# Patient Record
Sex: Male | Born: 1960
Health system: Southern US, Community
[De-identification: ages and names within clinical notes are randomized; demographics above are authoritative.]

## PROBLEM LIST (undated history)

## (undated) DIAGNOSIS — C801 Malignant (primary) neoplasm, unspecified: Secondary | ICD-10-CM

## (undated) DIAGNOSIS — R972 Elevated prostate specific antigen [PSA]: Secondary | ICD-10-CM

## (undated) DIAGNOSIS — R51 Headache: Secondary | ICD-10-CM

## (undated) DIAGNOSIS — R519 Headache, unspecified: Secondary | ICD-10-CM

## (undated) DIAGNOSIS — F329 Major depressive disorder, single episode, unspecified: Secondary | ICD-10-CM

## (undated) DIAGNOSIS — K760 Fatty (change of) liver, not elsewhere classified: Secondary | ICD-10-CM

## (undated) DIAGNOSIS — N4231 Prostatic intraepithelial neoplasia: Secondary | ICD-10-CM

## (undated) DIAGNOSIS — N401 Enlarged prostate with lower urinary tract symptoms: Secondary | ICD-10-CM

## (undated) DIAGNOSIS — E119 Type 2 diabetes mellitus without complications: Secondary | ICD-10-CM

## (undated) DIAGNOSIS — N138 Other obstructive and reflux uropathy: Secondary | ICD-10-CM

## (undated) DIAGNOSIS — L03119 Cellulitis of unspecified part of limb: Secondary | ICD-10-CM

## (undated) DIAGNOSIS — F419 Anxiety disorder, unspecified: Secondary | ICD-10-CM

## (undated) DIAGNOSIS — L0291 Cutaneous abscess, unspecified: Secondary | ICD-10-CM

## (undated) DIAGNOSIS — M1990 Unspecified osteoarthritis, unspecified site: Secondary | ICD-10-CM

## (undated) DIAGNOSIS — F32A Depression, unspecified: Secondary | ICD-10-CM

## (undated) DIAGNOSIS — K219 Gastro-esophageal reflux disease without esophagitis: Secondary | ICD-10-CM

## (undated) DIAGNOSIS — E291 Testicular hypofunction: Secondary | ICD-10-CM

## (undated) DIAGNOSIS — J449 Chronic obstructive pulmonary disease, unspecified: Secondary | ICD-10-CM

## (undated) HISTORY — DX: Headache, unspecified: R51.9

## (undated) HISTORY — DX: Prostatic intraepithelial neoplasia: N42.31

## (undated) HISTORY — DX: Other obstructive and reflux uropathy: N13.8

## (undated) HISTORY — DX: Benign prostatic hyperplasia with lower urinary tract symptoms: N40.1

## (undated) HISTORY — DX: Gastro-esophageal reflux disease without esophagitis: K21.9

## (undated) HISTORY — DX: Major depressive disorder, single episode, unspecified: F32.9

## (undated) HISTORY — DX: Headache: R51

## (undated) HISTORY — DX: Depression, unspecified: F32.A

## (undated) HISTORY — PX: ELBOW SURGERY: SHX618

## (undated) HISTORY — DX: Malignant (primary) neoplasm, unspecified: C80.1

## (undated) HISTORY — DX: Cellulitis of unspecified part of limb: L03.119

## (undated) HISTORY — DX: Testicular hypofunction: E29.1

## (undated) HISTORY — DX: Cutaneous abscess, unspecified: L02.91

## (undated) HISTORY — DX: Unspecified osteoarthritis, unspecified site: M19.90

## (undated) HISTORY — PX: OTHER SURGICAL HISTORY: SHX169

## (undated) HISTORY — DX: Elevated prostate specific antigen (PSA): R97.20

---

## 2004-04-19 ENCOUNTER — Emergency Department: Payer: Self-pay | Admitting: Emergency Medicine

## 2004-04-20 ENCOUNTER — Ambulatory Visit: Payer: Self-pay | Admitting: Emergency Medicine

## 2004-04-23 ENCOUNTER — Ambulatory Visit: Payer: Self-pay | Admitting: Gastroenterology

## 2004-04-23 IMAGING — NM NUCLEAR MEDICINE HEPATOHBILIARY INCLUDE GB
3 series · 17 of 17 positions shown · non-contrast
Comparison: none

REASON FOR EXAM: abd pain (US abd [DATE])
COMMENTS:

[Series 1: gallbladder dynamic · 4.80mm/px · 6 of 60 frames shown]
[frame 6/60]
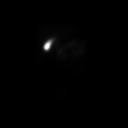
[frame 16/60]
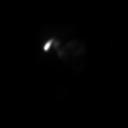
[frame 26/60]
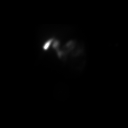
[frame 36/60]
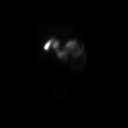
[frame 46/60]
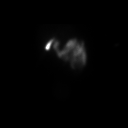
[frame 56/60]
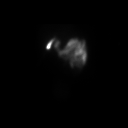

[Series 1: gallbladder statics · 4.80mm/px · 5 of 5 slices shown]
[im 1/5]
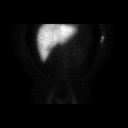
[im 2/5]
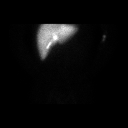
[im 3/5]
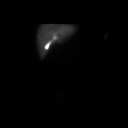
[im 4/5]
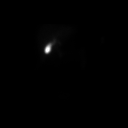
[im 5/5]
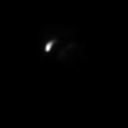

[Series 1: gallbladder dynamic (results) · 4.80mm/px · 6 of 60 frames shown]
[frame 6/60]
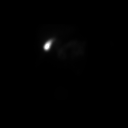
[frame 16/60]
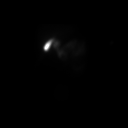
[frame 26/60]
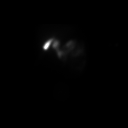
[frame 36/60]
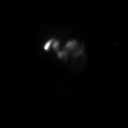
[frame 46/60]
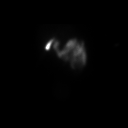
[frame 56/60]
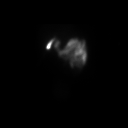

[17 of 17 positions shown; findings below may reference images not displayed]

PROCEDURE:     NM  - NM HEPATO WITH GB EJECT FRACTION  - [DATE]  [DATE]

RESULT:        Following injection of 8.01 mCi Tc 99m Choletec,  there is
noted prompt visualization of tracer activity in the liver at one minute.
At 30 minutes, tracer activity is visualized in the gallbladder, common duct
and proximal small bowel.

The gallbladder ejection fraction at 29 minutes measures 82% which is in the
normal range.
IMPRESSION: 1.     Normal hepatobiliary scan.
2.     Normal gallbladder ejection fraction.

## 2004-04-30 ENCOUNTER — Ambulatory Visit: Payer: Self-pay | Admitting: Gastroenterology

## 2004-07-03 ENCOUNTER — Ambulatory Visit: Payer: Self-pay | Admitting: Gastroenterology

## 2006-06-20 ENCOUNTER — Emergency Department: Payer: Self-pay | Admitting: Emergency Medicine

## 2007-11-23 ENCOUNTER — Ambulatory Visit: Payer: Self-pay | Admitting: Specialist

## 2007-11-23 IMAGING — CT CT CHEST W/ CM
1 series · 15 of 31 positions shown, 19 images · non-contrast
Comparison: none

REASON FOR EXAM: abnormal lung findings   spot on lung
COMMENTS:

[Series 2: soft tissue · axial · 0.71mm/px · z∈[-460,-156]mm · 15 of 67 slices shown, 19 images]
[im 3/67  mediastinal]
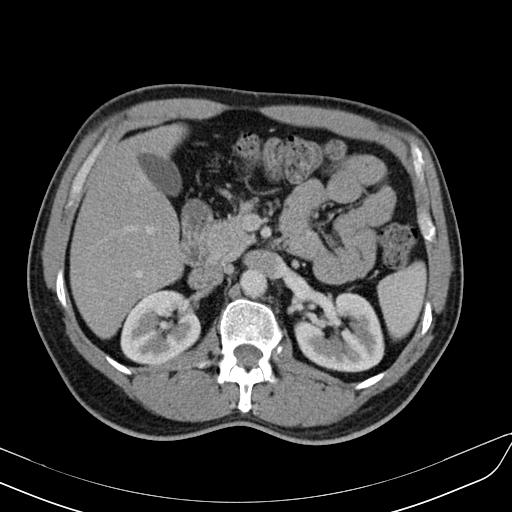
[im 3/67  lung]
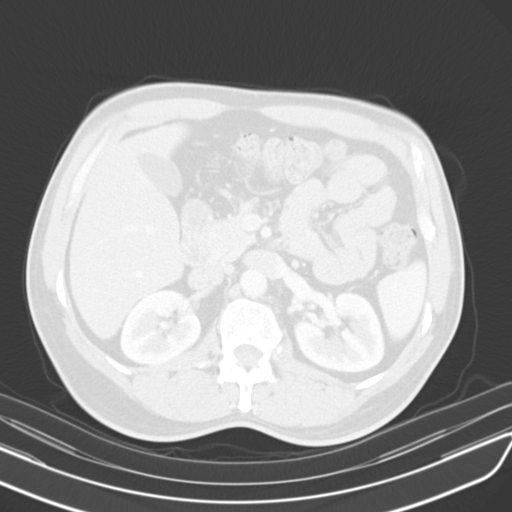
[im 8/67  lung]
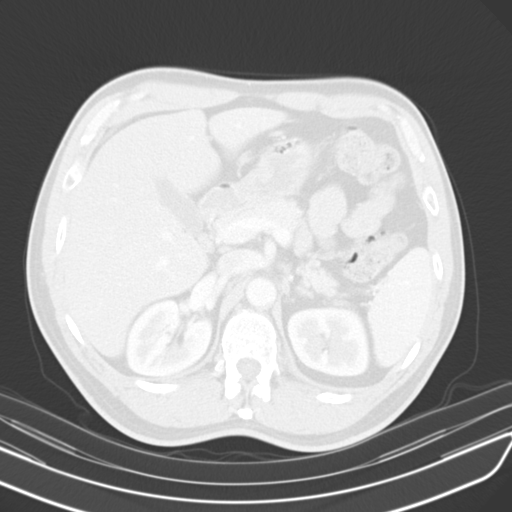
[im 13/67  lung]
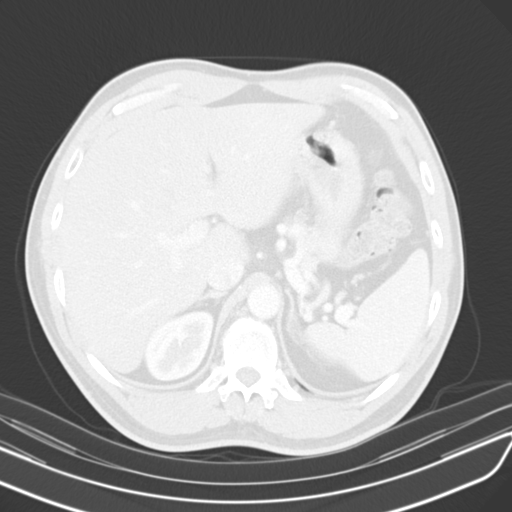
[im 15/67  lung]
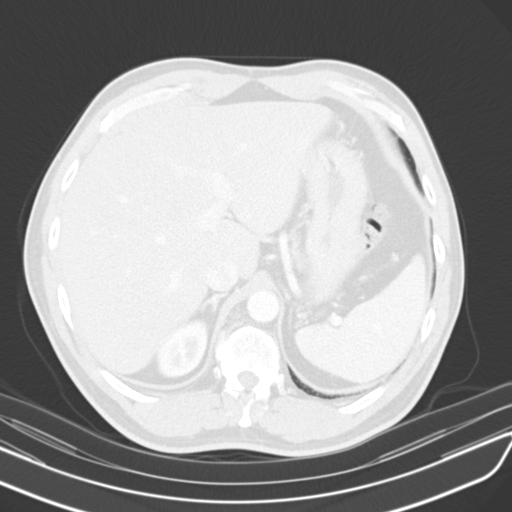
[im 20/67  mediastinal]
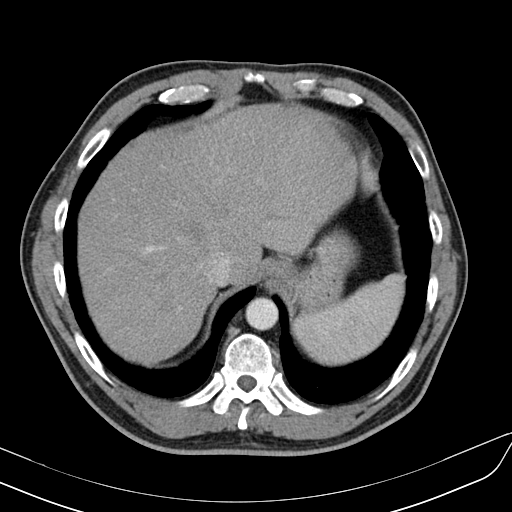
[im 20/67  lung]
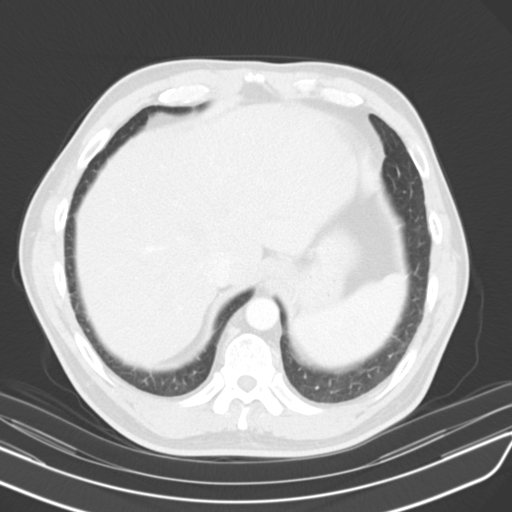
[im 25/67  lung]
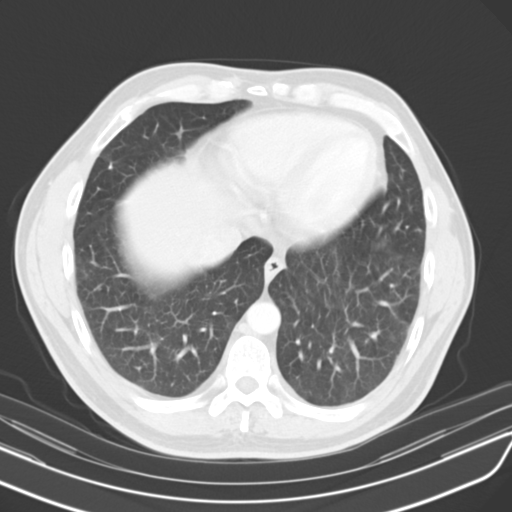
[im 30/67  lung]
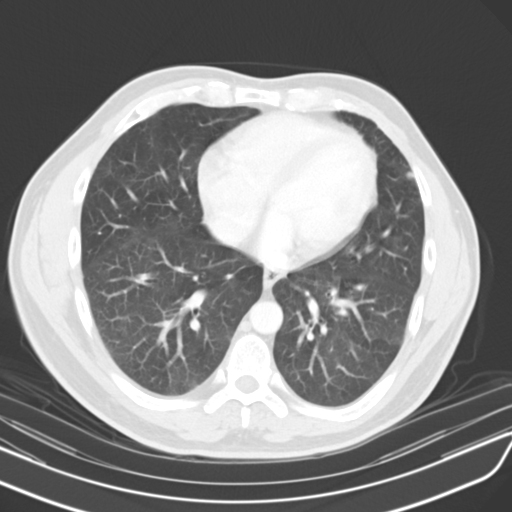
[im 35/67  lung]
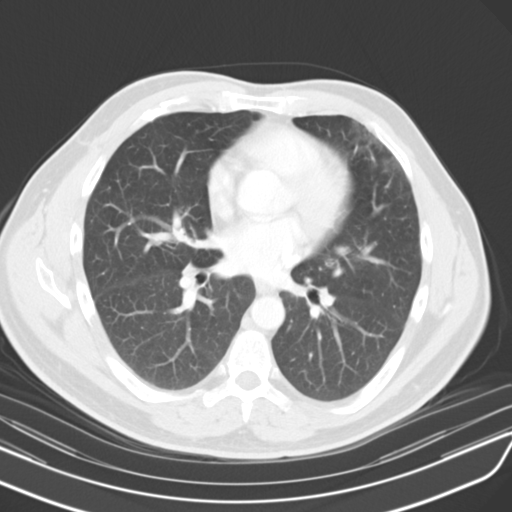
[im 37/67  mediastinal]
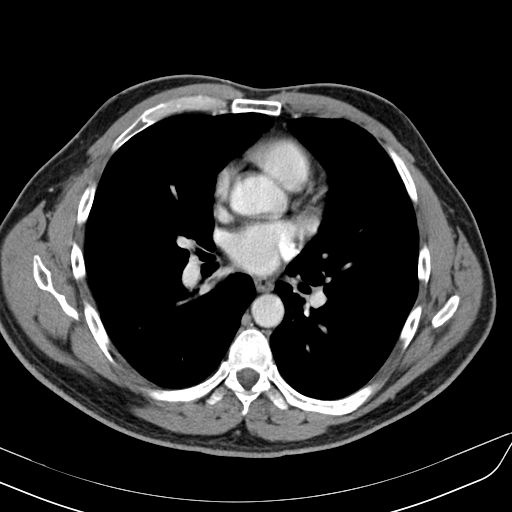
[im 37/67  lung]
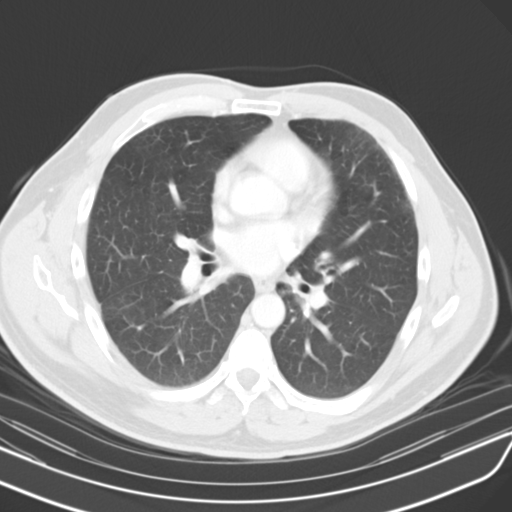
[im 42/67  lung]
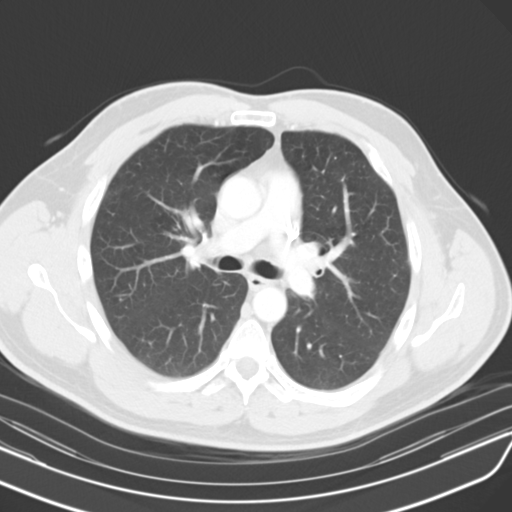
[im 47/67  lung]
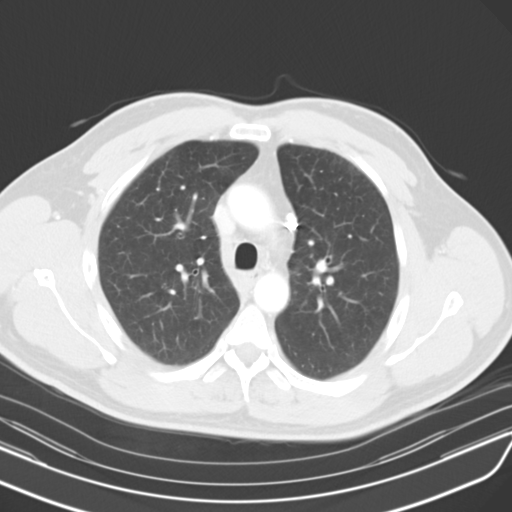
[im 52/67  lung]
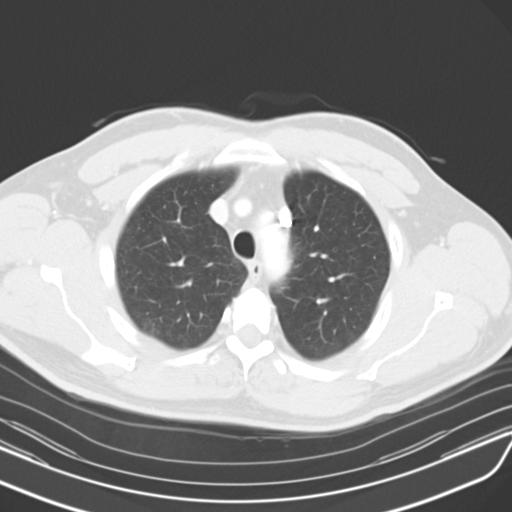
[im 54/67  mediastinal]
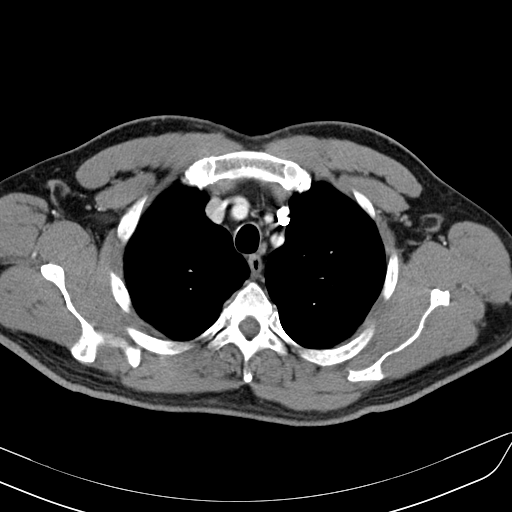
[im 54/67  lung]
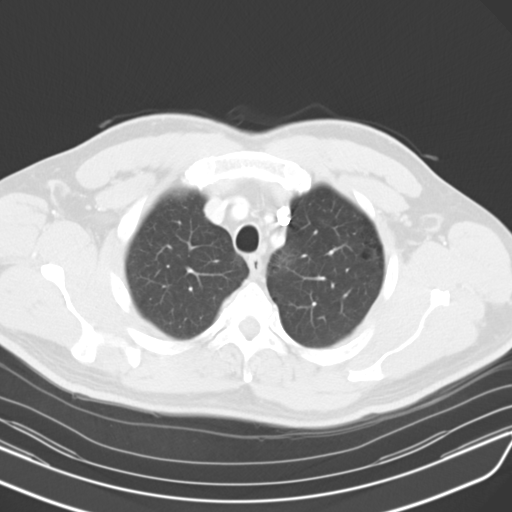
[im 59/67  lung]
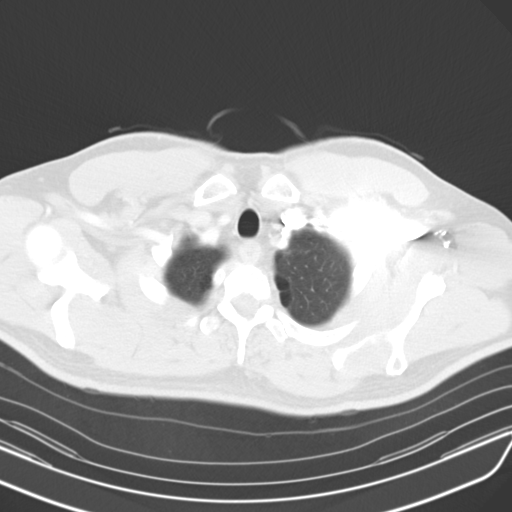
[im 64/67  lung]
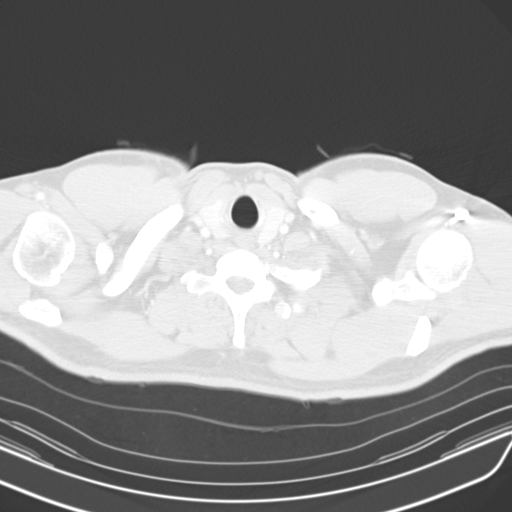

[15 of 31 positions shown; findings below may reference images not displayed]

PROCEDURE:     ARISSA - ARISSA CHEST WITH CONTRAST  - [DATE]  [DATE]

RESULT:     The patient is undergoing evaluation due to abnormality
demonstrated on prior x-ray. The patient received 75 ml of [55] for
this study.

At lung window settings there are mild emphysematous changes present
bilaterally. There is a subpleural nodule noted in the lingula on the LEFT
seen on image #38. It measures approximately 5 mm in maximal dimension. I
see no abnormal nodule on the RIGHT.

At mediastinal window settings I see no pathologic sized lymph nodes in the
hilar regions or mediastinum. There is no pleural nor paracardial effusion.
At the thoracic inlet the observed portions of the thyroid gland are normal.
The caliber of the thoracic aorta is normal. The cardiac chambers are normal
in size. The thoracic vertebral bodies are preserved in height. Within the
upper abdomen, the observed portions of the liver are normal. The patient
has a persistent LEFT-sided inferior vena cava, which crosses over to the
RIGHT at the level of the renal vein on the LEFT.
IMPRESSION: 1. There is an approximately 5 mm diameter nodule seen on image #38 in the
lingula. This is noncalcified and also nonspecific. A follow-up CT scan in 3
to 4 months is recommended to assure ongoing stability.
2. There is no evidence of CHF nor of pneumonia nor other acute
cardiopulmonary abnormality.

## 2008-04-03 ENCOUNTER — Ambulatory Visit: Payer: Self-pay | Admitting: Specialist

## 2008-04-03 IMAGING — CT CT CHEST W/ CM
1 series · 16 of 33 positions shown, 20 images · non-contrast
Comparison: none

REASON FOR EXAM: right lung nodule
COMMENTS:

[Series 2: soft tissue · axial · 0.71mm/px · z∈[-194,+116]mm · 16 of 68 slices shown, 20 images]
[im 3/68  mediastinal]
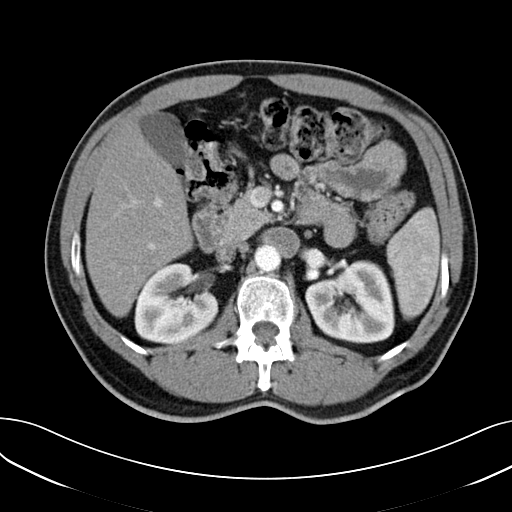
[im 3/68  lung]
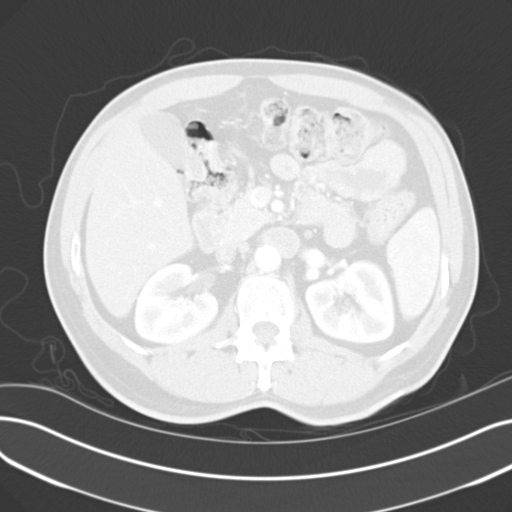
[im 8/68  lung]
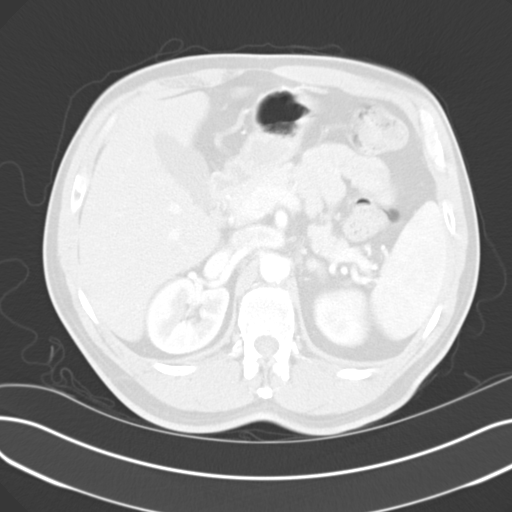
[im 13/68  lung]
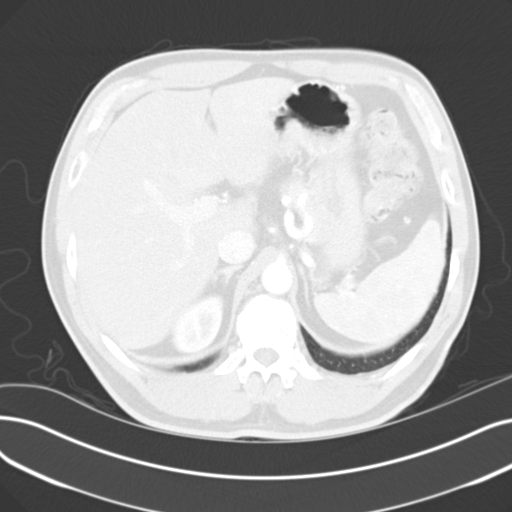
[im 15/68  lung]
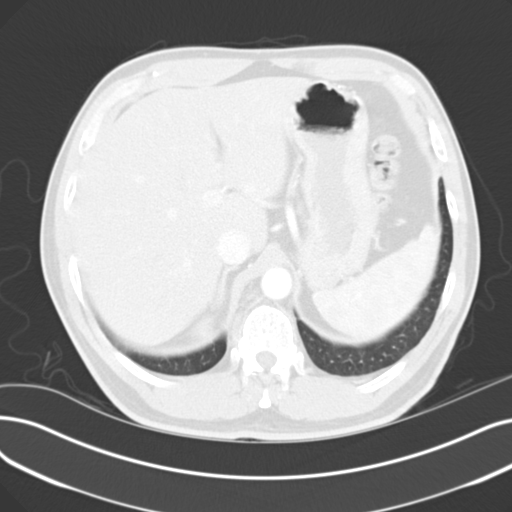
[im 20/68  mediastinal]
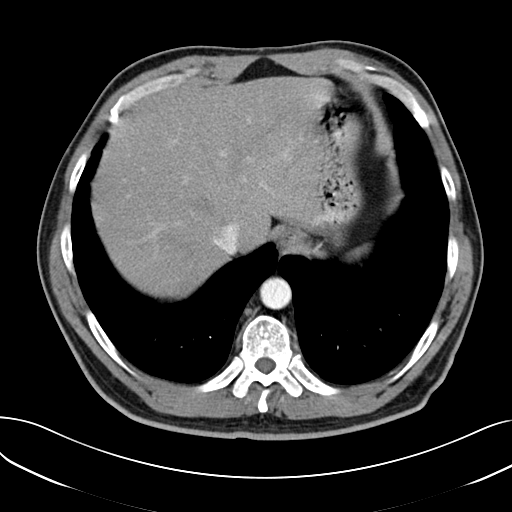
[im 20/68  lung]
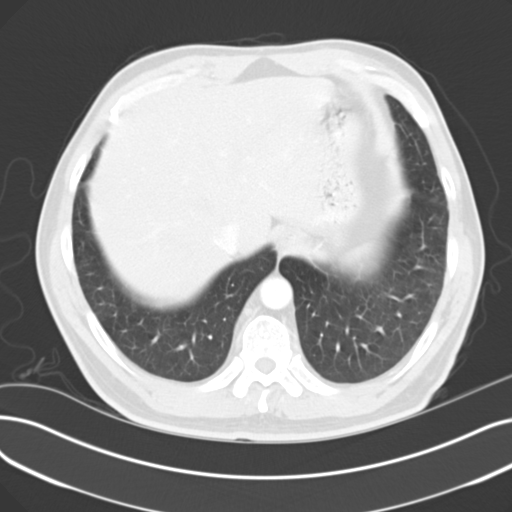
[im 25/68  lung]
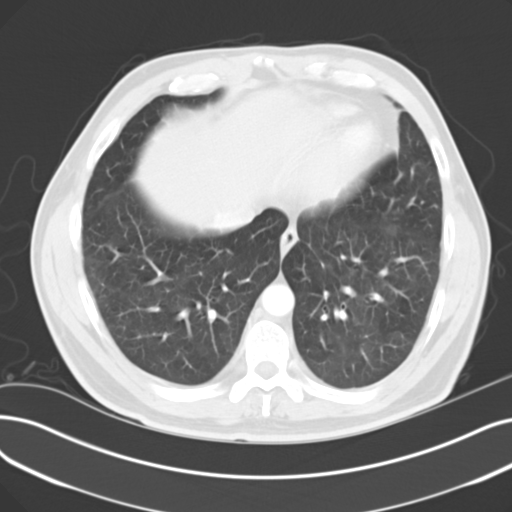
[im 28/68  lung]
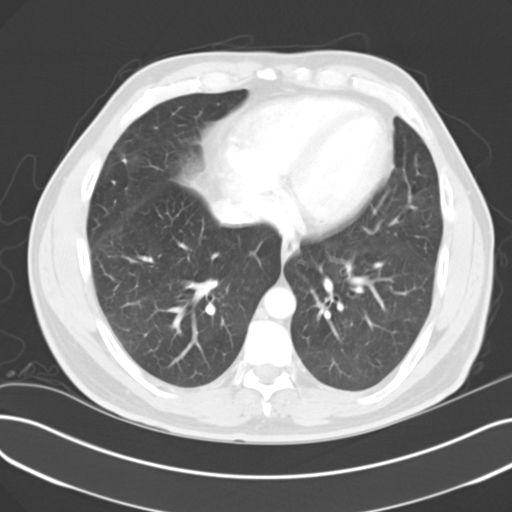
[im 33/68  lung]
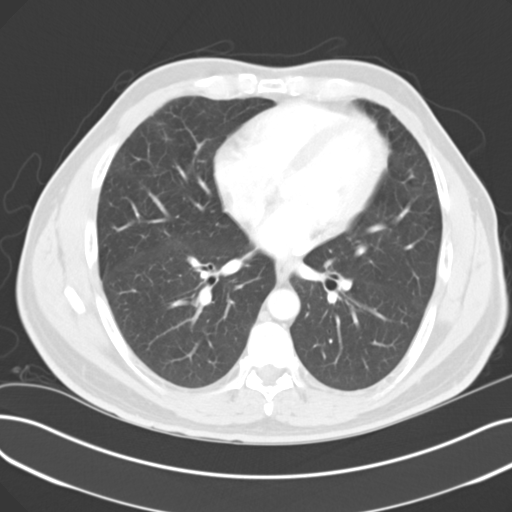
[im 36/68  mediastinal]
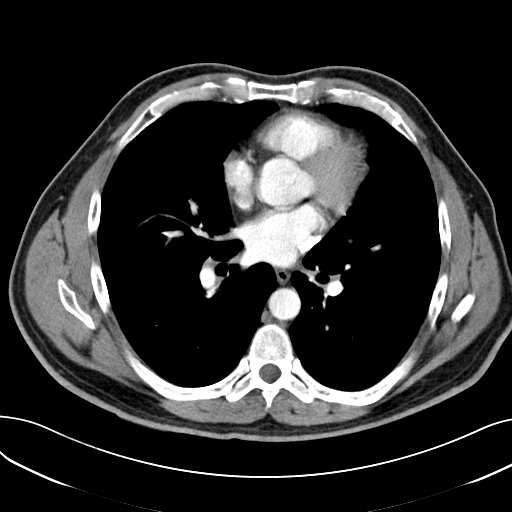
[im 36/68  lung]
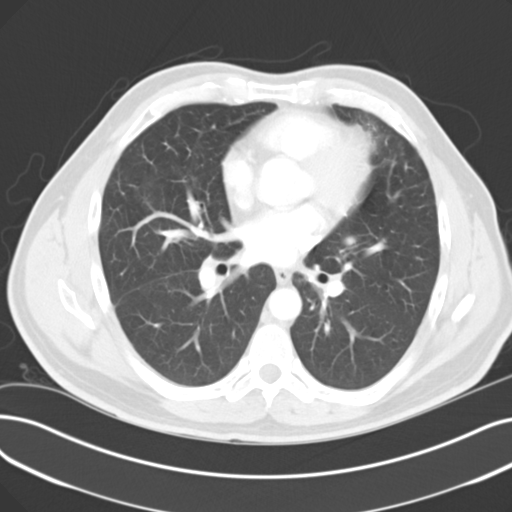
[im 40/68  lung]
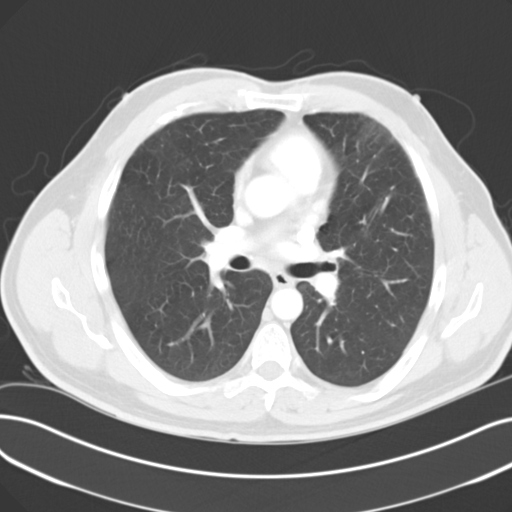
[im 43/68  lung]
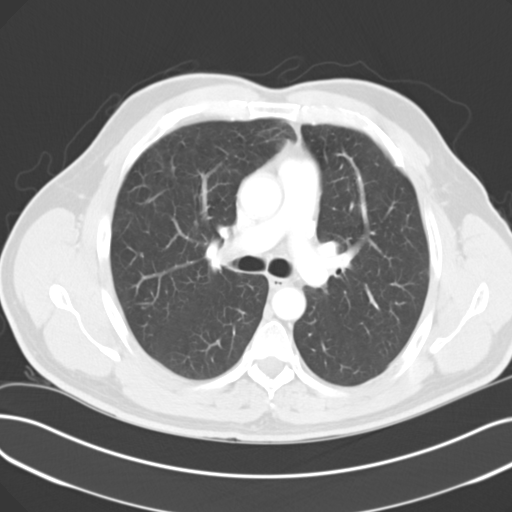
[im 48/68  lung]
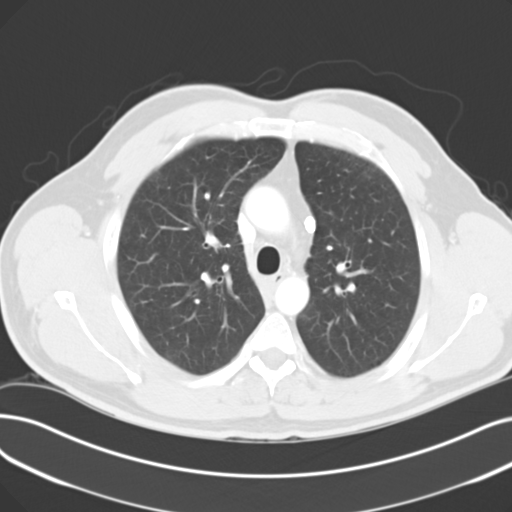
[im 53/68  mediastinal]
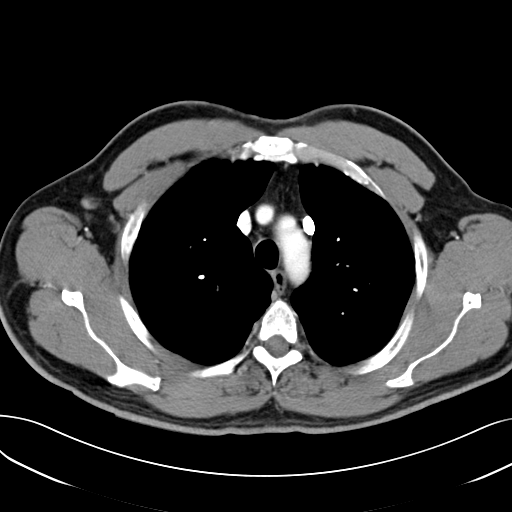
[im 53/68  lung]
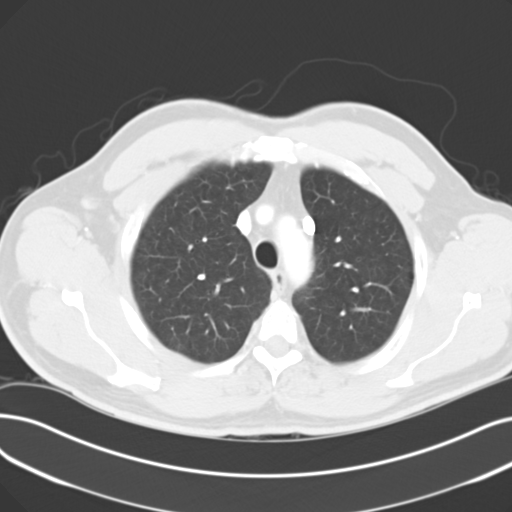
[im 55/68  lung]
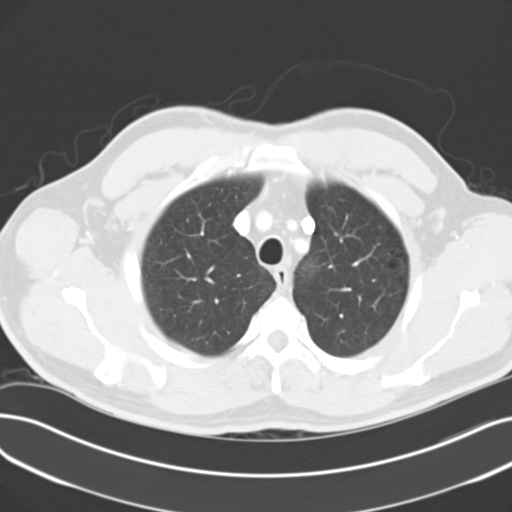
[im 60/68  lung]
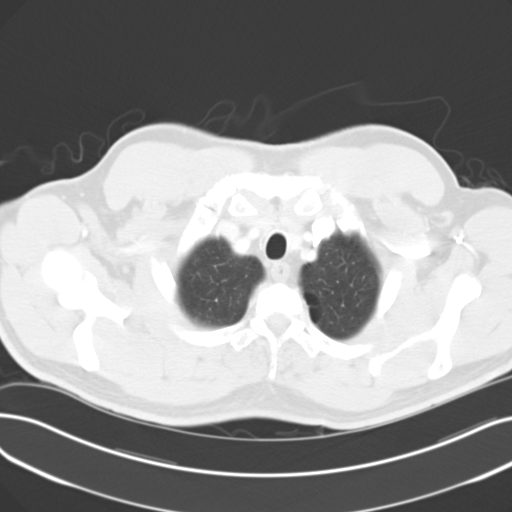
[im 65/68  lung]
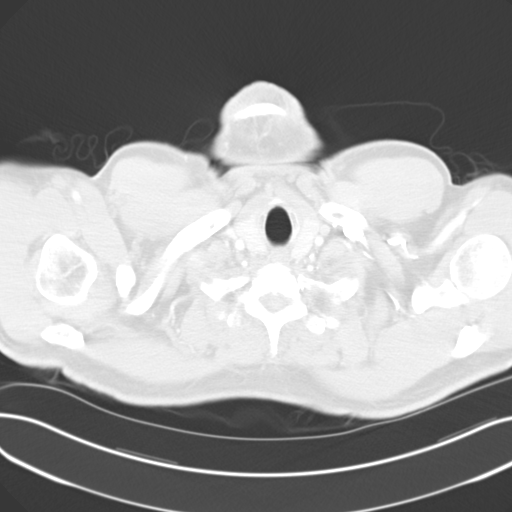

[16 of 33 positions shown; findings below may reference images not displayed]

PROCEDURE:     CT  - CT CHEST WITH CONTRAST  - [DATE]  [DATE]

RESULT:     CT of the chest is performed utilizing approximately 70 ml of
[38] iodinated intravenous contrast. Comparison is made to a previous
examination performed on [DATE]. There is a subpleural lingular density
seen on image 37 of the lung window settings and measuring approximately 5
mm in diameter. This appears to be stable. No new areas of nodularity are
present. There is no pleural or pericardial effusion. There is no
supraclavicular, axillary, mediastinal or hilar mass or adenopathy. The
upper abdominal structures included on the exam showed no definite
abnormality.
IMPRESSION: Stable subpleural nodular density in the lingula.  Follow up is recommended
in 6 months. The noncalcified smoothly marginated 5 mm lesion should be
followed for a total of 2 years following the initial diagnosis to document
stability.

## 2008-04-04 ENCOUNTER — Ambulatory Visit: Payer: Self-pay | Admitting: Unknown Physician Specialty

## 2008-09-30 ENCOUNTER — Ambulatory Visit: Payer: Self-pay | Admitting: Family

## 2008-09-30 IMAGING — CT CT CHEST W/ CM
1 of 2 series · 14 of 32 positions shown, 18 images · non-contrast
Comparison: none

REASON FOR EXAM: lung nodule
COMMENTS:

[Series 3: lung windows · axial · 0.66mm/px · z∈[+604,+874]mm · 14 of 64 slices shown, 18 images]
[im 5/64  mediastinal]
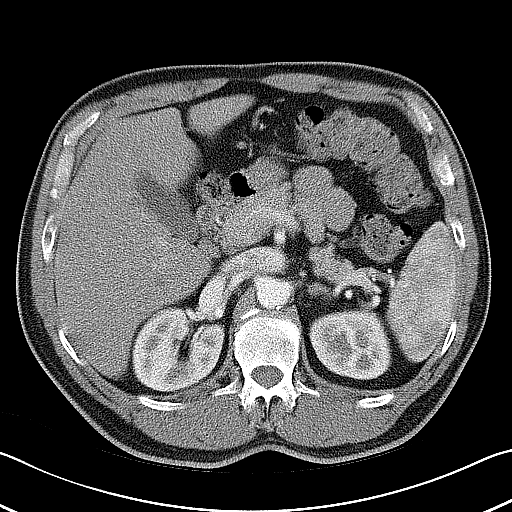
[im 5/64  lung]
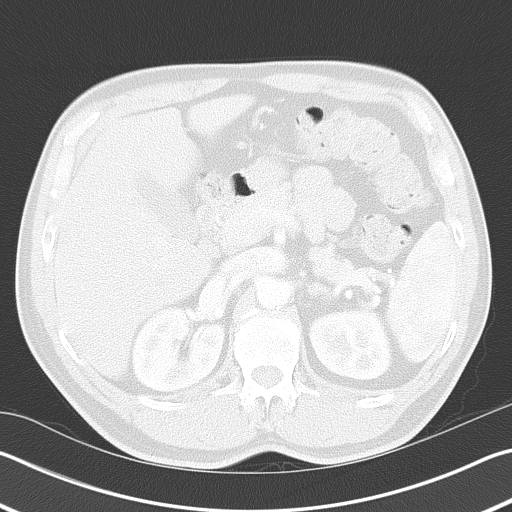
[im 10/64  lung]
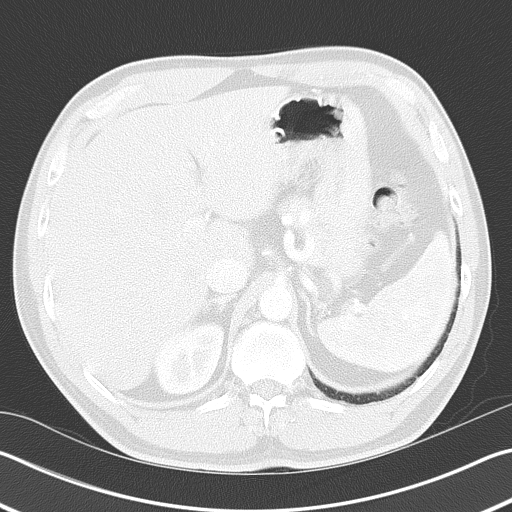
[im 15/64  lung]
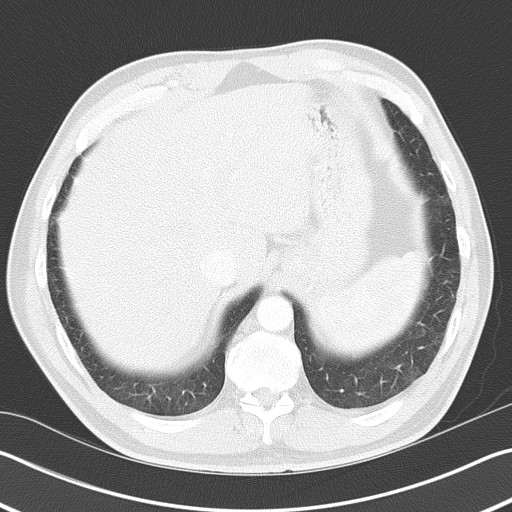
[im 20/64  lung]
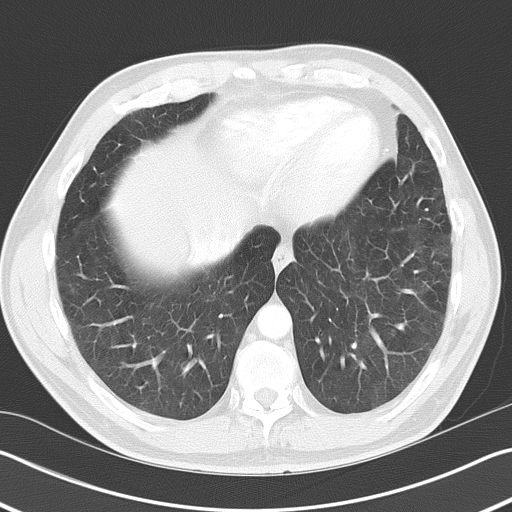
[im 25/64  mediastinal]
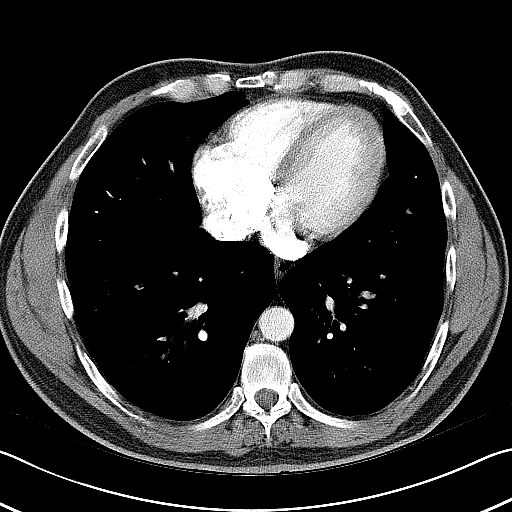
[im 25/64  lung]
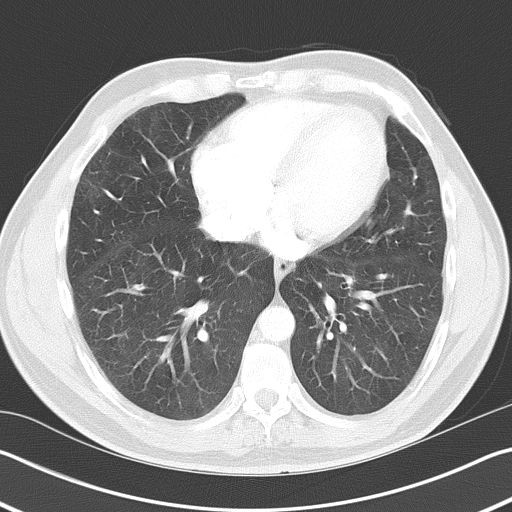
[im 29/64  lung]
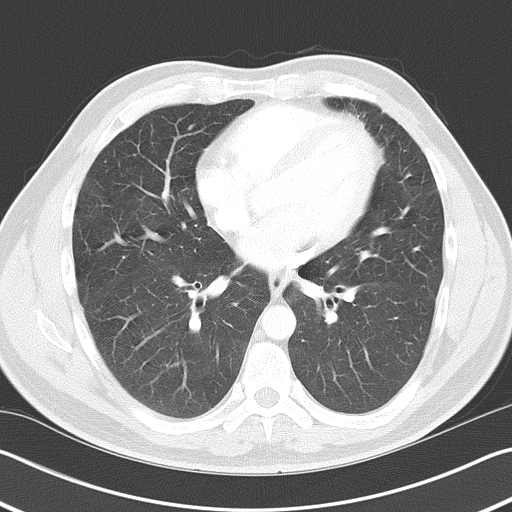
[im 30/64  lung]
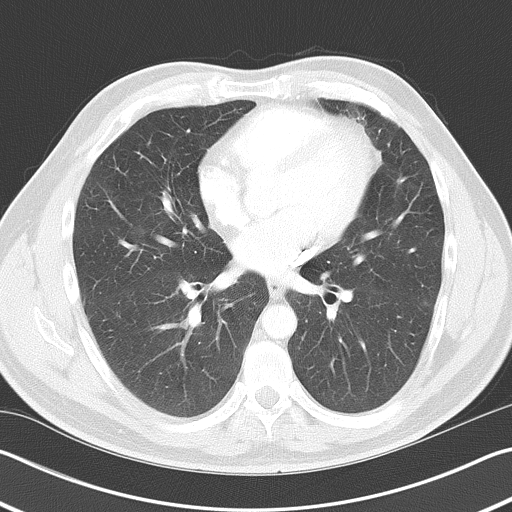
[im 32/64  lung]
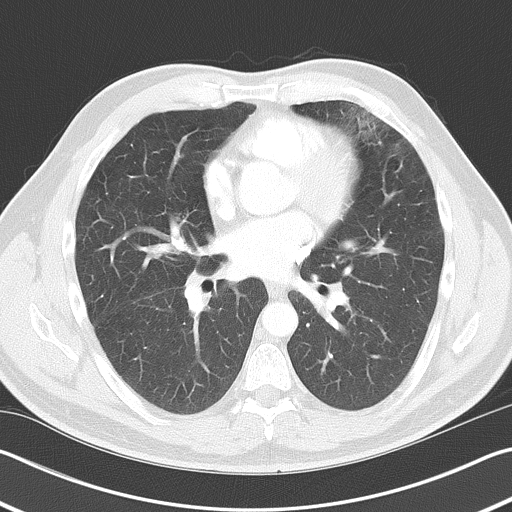
[im 34/64  mediastinal]
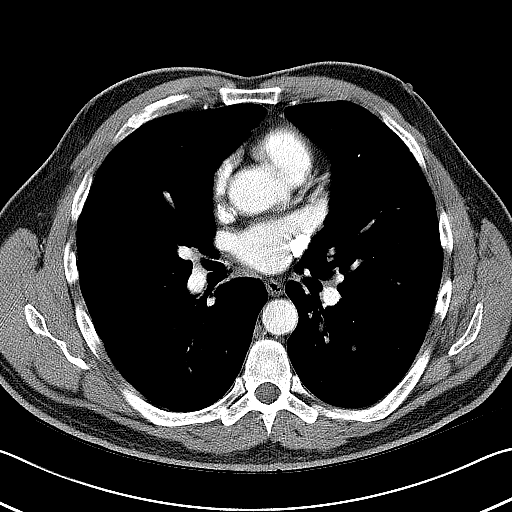
[im 34/64  lung]
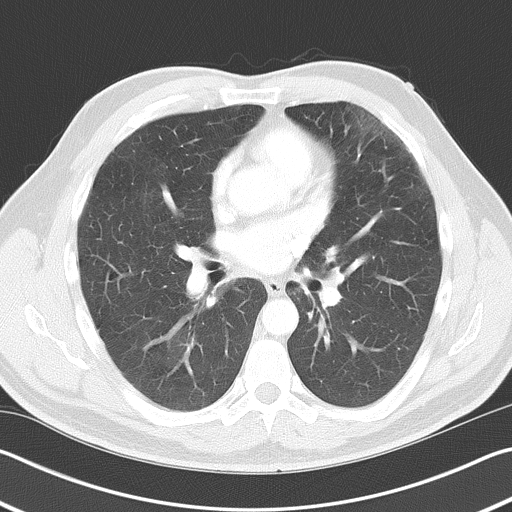
[im 39/64  lung]
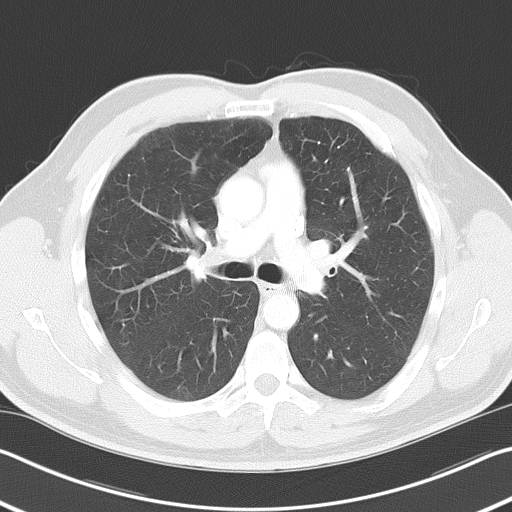
[im 44/64  lung]
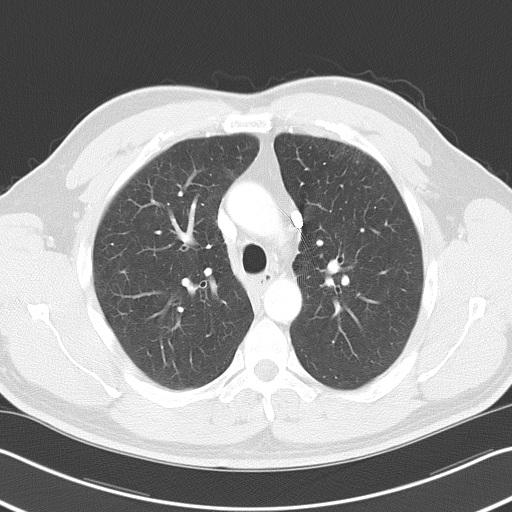
[im 49/64  lung]
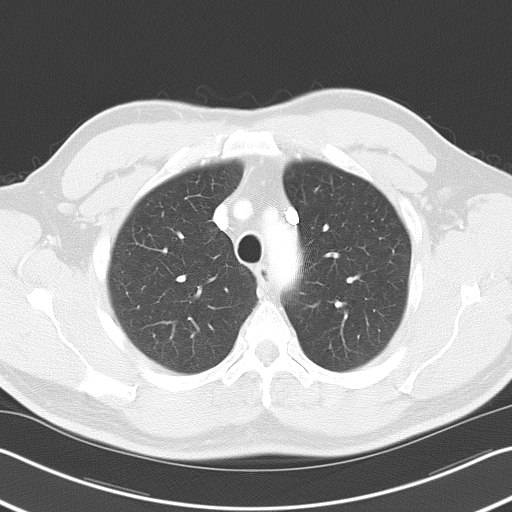
[im 54/64  mediastinal]
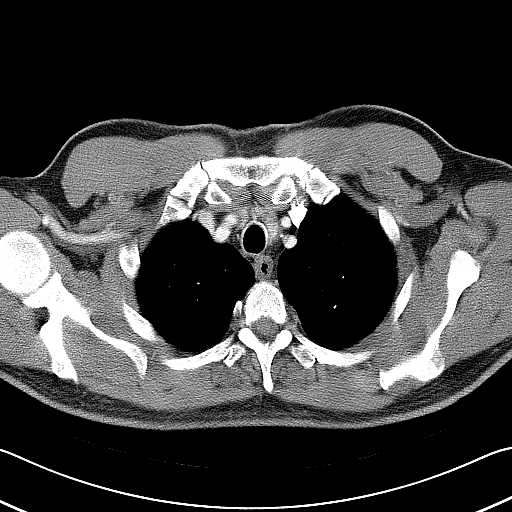
[im 54/64  lung]
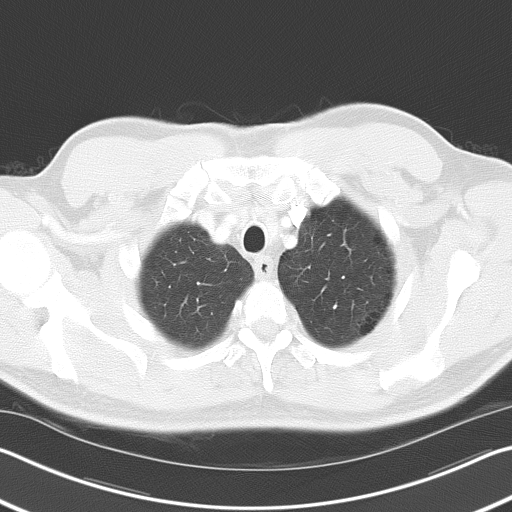
[im 59/64  lung]
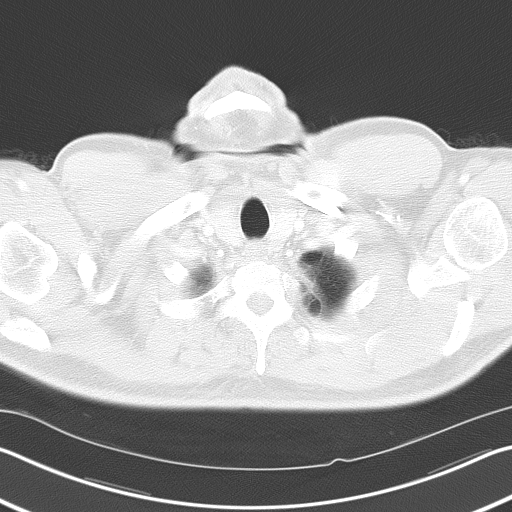

[14 of 32 positions shown; findings below may reference images not displayed]

PROCEDURE:     CT  - CT CHEST WITH CONTRAST  - [DATE] [DATE]

RESULT:

Helical, 5 mm sections were obtained from the thoracic inlet through the
lung bases status post intravenous administration of 75 ml of [TQ].

Comparison is made to a prior study dated [DATE].

Evaluation of the mediastinum and hilar regions and structures demonstrates
no evidence of mediastinal or hilar adenopathy or masses.

Evaluation of the lung parenchyma demonstrates mild to moderate
emphysematous changes throughout both lungs. The previously described,
subpleural nodular density within the anterior base of the lingula has
decreased in conspicuity when compared to the previous study. Maximum
diameter is 5.0 mm and the decreased conspicuity may be secondary to
technique. No further pulmonary nodules or masses are identified.

The visualized upper abdominal viscera demonstrate no gross abnormalities.
IMPRESSION: Stable, small nodule within the region of the lingula.
Again, this can be monitored with surveillance evaluation, if and as
clinically warranted. No new nodules or masses are identified.

## 2009-04-01 ENCOUNTER — Ambulatory Visit: Payer: Self-pay | Admitting: Family

## 2009-04-01 IMAGING — CT CT CHEST W/ CM
1 series · 15 of 32 positions shown, 19 images · non-contrast
Comparison: none

REASON FOR EXAM: lower left lung mass  Allergic to [employer]  office to
premedicate
COMMENTS:

[Series 2: soft tissue · axial · 0.73mm/px · z∈[+7,+287]mm · 15 of 63 slices shown, 19 images]
[im 5/63  soft-tissue]
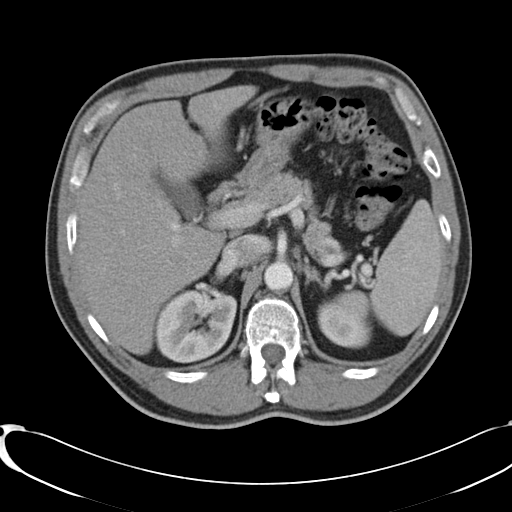
[im 5/63  bone]
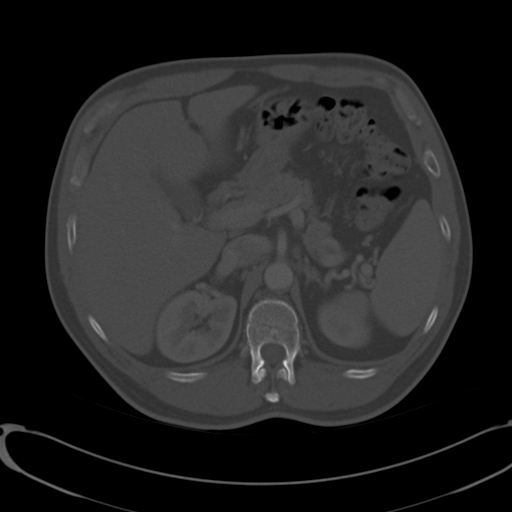
[im 9/63  soft-tissue]
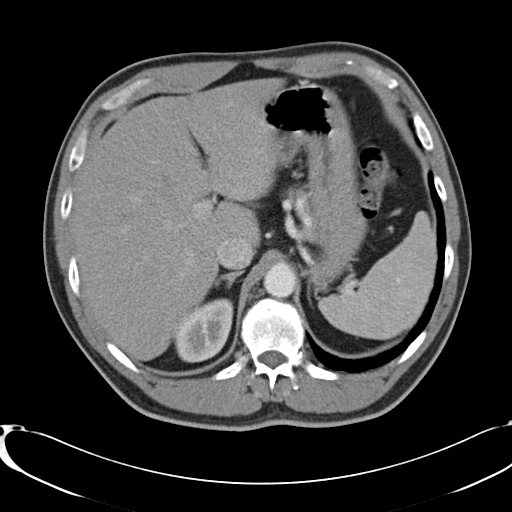
[im 13/63  soft-tissue]
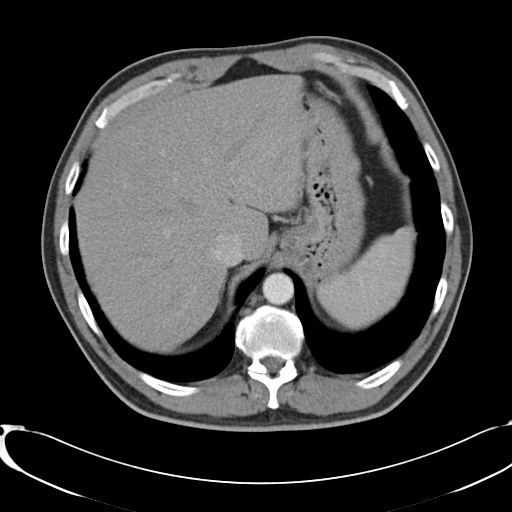
[im 19/63  soft-tissue]
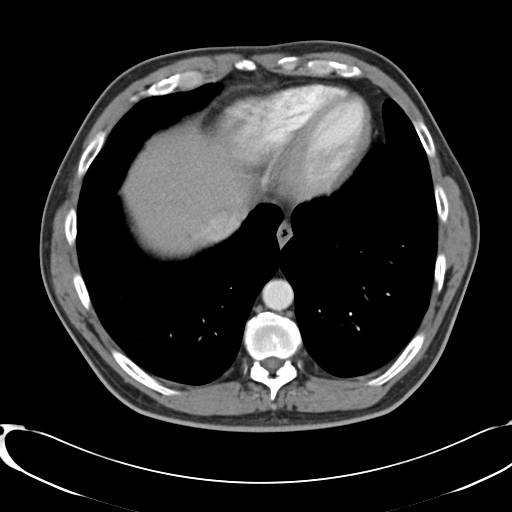
[im 23/63  soft-tissue]
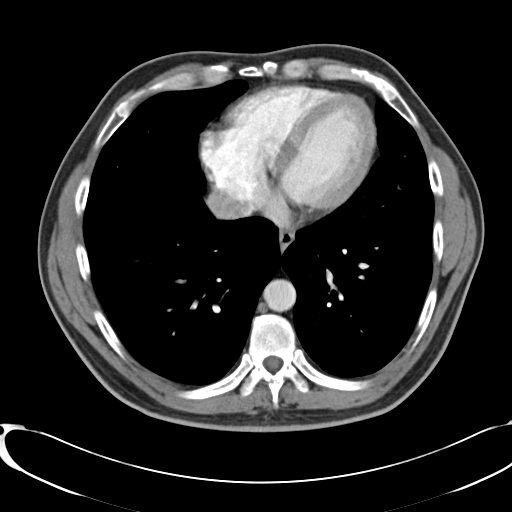
[im 27/63  soft-tissue]
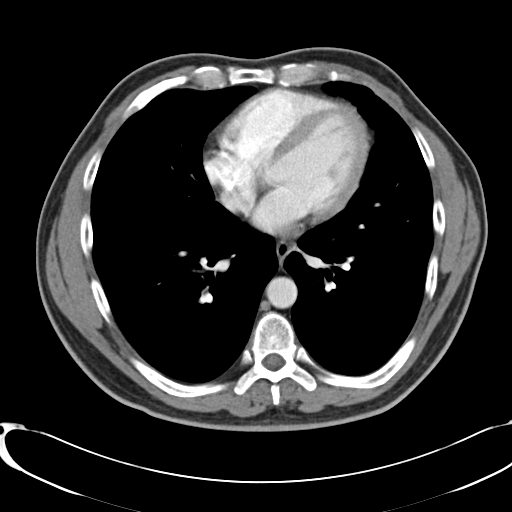
[im 33/63  soft-tissue]
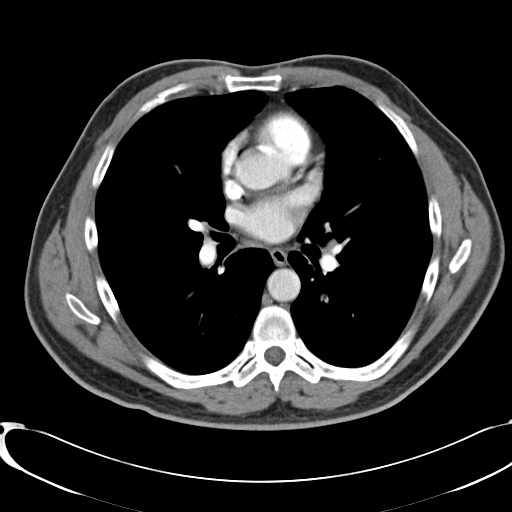
[im 37/63  soft-tissue]
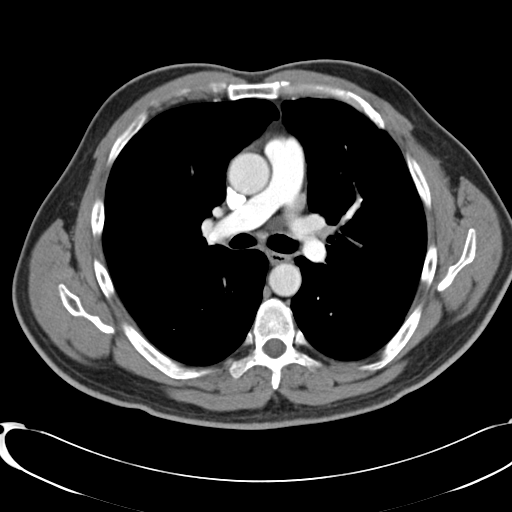
[im 41/63  soft-tissue]
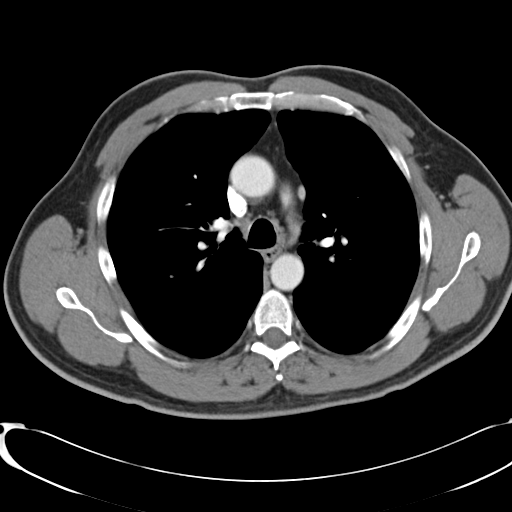
[im 41/63  bone]
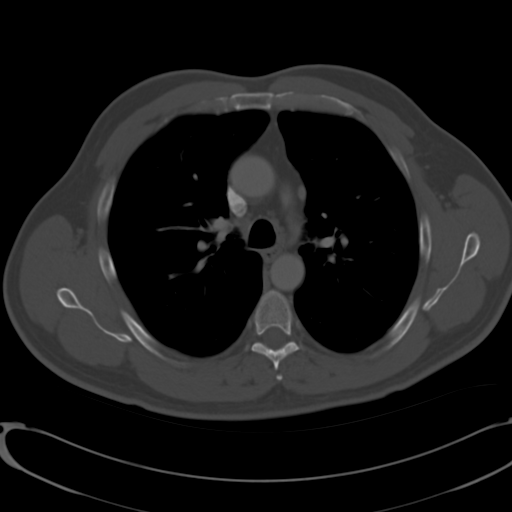
[im 45/63  soft-tissue]
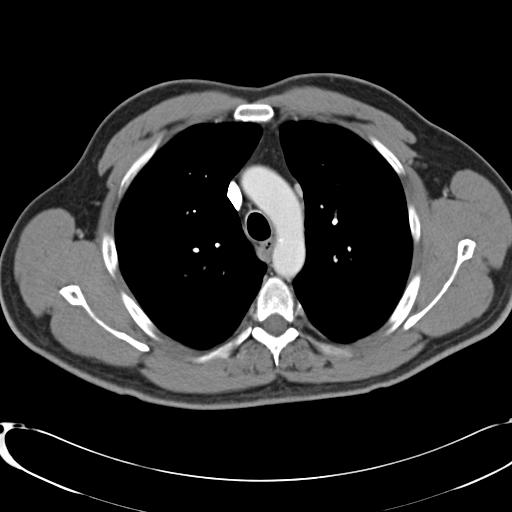
[im 51/63  soft-tissue]
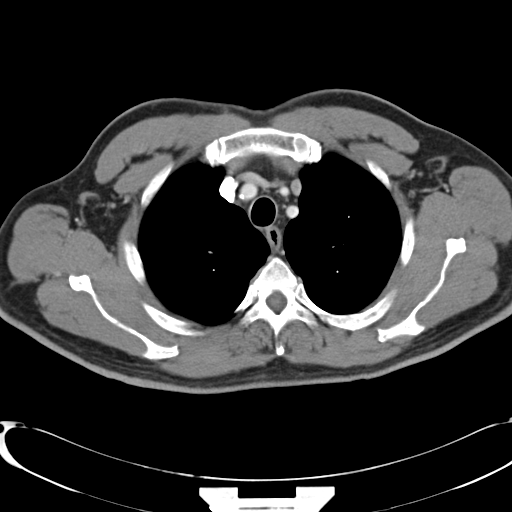
[im 55/63  soft-tissue]
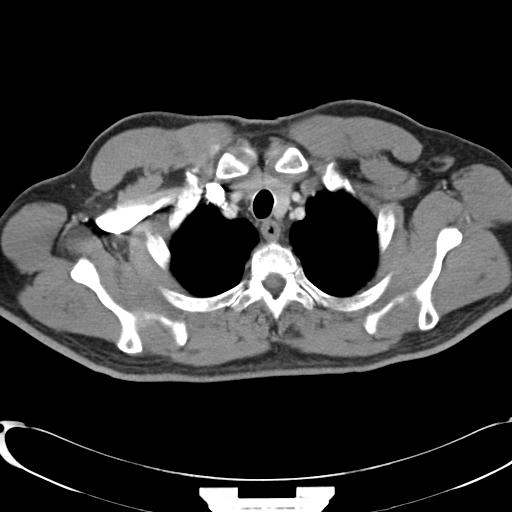
[im 55/63  lung]
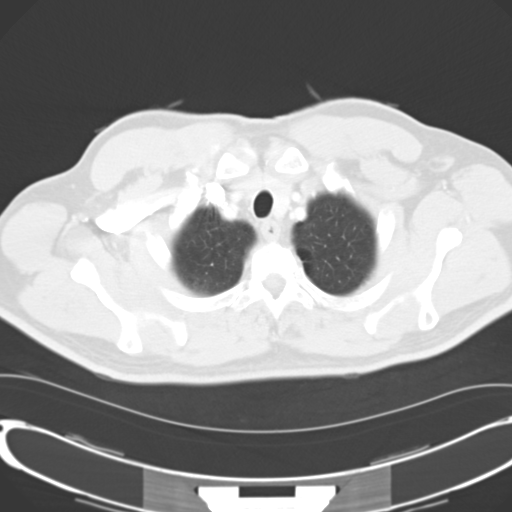
[im 57/63  lung]
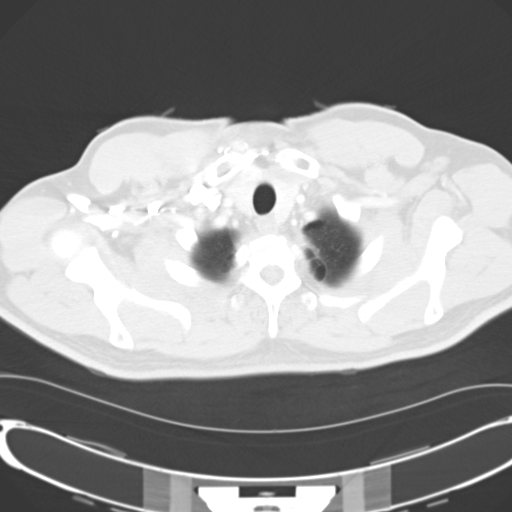
[im 59/63  soft-tissue]
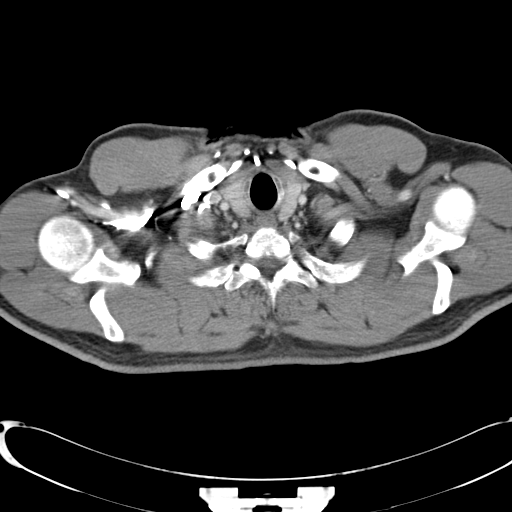
[im 59/63  lung]
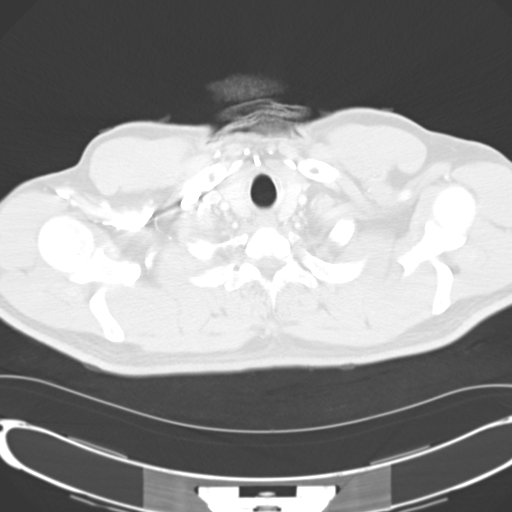
[im 61/63  lung]
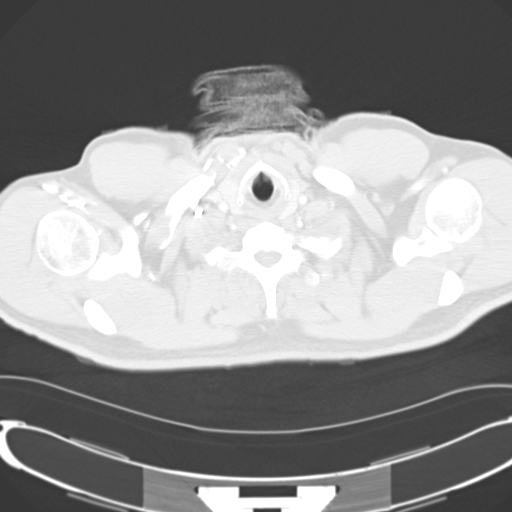

[15 of 32 positions shown; findings below may reference images not displayed]

PROCEDURE:     CT  - CT CHEST WITH CONTRAST  - [DATE]  [DATE]

RESULT:     CT of the chest is performed utilizing 75 mL of [HB]
iodinated intravenous contrast. Images are reconstructed in the axial plane
at 5 mm slice thickness. Comparison is made to images performed on [DATE]
and [DATE].

Lung window settings demonstrate a subtle 5 mm subpleural nodular density
seen on image 39 which is similar to the appearance on image 37 from the
[DATE] exam. This was less well seen on image 38 of the [DATE] exam.
The lungs are otherwise clear. There is no edema, infiltrate, effusion or
pneumothorax. Tiny peripheral bullae are seen medially at the left lung apex
on image 8. Mild emphysematous changes are present. The thyroid lobes
included on the study appear unremarkable and enhance homogeneously. There
is no pulmonary embolism, mediastinal or hilar mass. The upper abdominal
structures included on the exam appear within normal limits.
IMPRESSION: Stable chest CT with a smoothly marginated 5 mm subpleural to pleural-based
nodular density in the lingula. Followup in one year is recommended to
document stability.

## 2009-06-21 LAB — HM COLONOSCOPY

## 2011-04-07 ENCOUNTER — Other Ambulatory Visit: Payer: Self-pay | Admitting: Internal Medicine
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
## 2011-04-09 MED ORDER — SERTRALINE HCL 100 MG PO TABS: ORAL_TABLET | ORAL | Status: DC

## 2011-05-07 ENCOUNTER — Telehealth: Payer: Self-pay | Admitting: Internal Medicine

## 2011-05-07 DIAGNOSIS — K219 Gastro-esophageal reflux disease without esophagitis: Secondary | ICD-10-CM

## 2011-05-07 MED ORDER — OMEPRAZOLE 40 MG PO CPDR: 40.0000 mg | DELAYED_RELEASE_CAPSULE | Freq: Every day | ORAL | Status: DC

## 2011-05-10 NOTE — Telephone Encounter (Signed)
Refill sent it per Dr. Darrick Huntsman

## 2011-10-05 ENCOUNTER — Other Ambulatory Visit: Payer: Self-pay | Admitting: Internal Medicine

## 2011-10-14 ENCOUNTER — Other Ambulatory Visit: Payer: Self-pay | Admitting: Internal Medicine

## 2011-10-14 NOTE — Telephone Encounter (Signed)
Left message asking patient to return my call.

## 2011-10-14 NOTE — Telephone Encounter (Signed)
30 days only,.  No more refills without at getting appt .  Needs medical records reuqested

## 2012-10-13 ENCOUNTER — Other Ambulatory Visit: Payer: Self-pay | Admitting: Internal Medicine

## 2012-11-16 ENCOUNTER — Other Ambulatory Visit: Payer: Self-pay | Admitting: Internal Medicine

## 2012-11-17 NOTE — Telephone Encounter (Signed)
Patient has not been seen in office

## 2012-11-21 ENCOUNTER — Other Ambulatory Visit: Payer: Self-pay | Admitting: Internal Medicine

## 2012-11-21 NOTE — Telephone Encounter (Signed)
Patient has not been seen in office nor does he have a future appointmenet

## 2012-11-29 ENCOUNTER — Other Ambulatory Visit: Payer: Self-pay | Admitting: *Deleted

## 2012-11-29 NOTE — Telephone Encounter (Signed)
Pt has not been seen in office

## 2012-12-18 ENCOUNTER — Other Ambulatory Visit: Payer: Self-pay | Admitting: *Deleted

## 2012-12-25 ENCOUNTER — Other Ambulatory Visit: Payer: Self-pay | Admitting: *Deleted

## 2014-03-05 ENCOUNTER — Ambulatory Visit: Payer: Self-pay | Admitting: Physician Assistant

## 2014-03-05 IMAGING — NM NUCLEAR MEDICINE HEPATOHBILIARY INCLUDE GB
2 series · 15 of 15 positions shown · non-contrast
Comparison: None.

CLINICAL DATA: Right upper quadrant pain

EXAM:
NUCLEAR MEDICINE HEPATOBILIARY IMAGING WITH GALLBLADDER EF
TECHNIQUE: Sequential images of the abdomen were obtained [DATE] minutes
following intravenous administration of radiopharmaceutical. After
slow intravenous infusion of 1.56 micrograms Cholecystokinin,
gallbladder ejection fraction was determined.
RADIOPHARMACEUTICALS:  7.95 [place] [H5] Choletec

[Series 1000: gallbladder statics · 4.80mm/px · 9 of 9 slices shown]
[im 1/9]
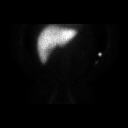
[im 2/9]
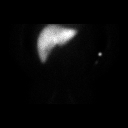
[im 3/9]
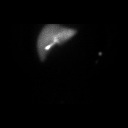
[im 4/9]
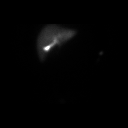
[im 5/9]
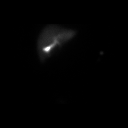
[im 6/9]
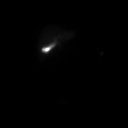
[im 7/9]
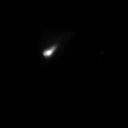
[im 8/9]
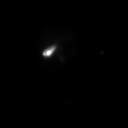
[im 9/9]
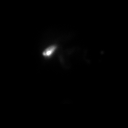

[Series 1000: gallbladder ef · 4.80mm/px · 6 of 120 frames shown]
[frame 11/120]
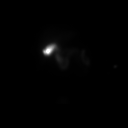
[frame 31/120]
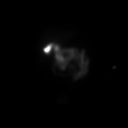
[frame 51/120]
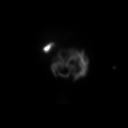
[frame 71/120]
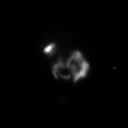
[frame 91/120]
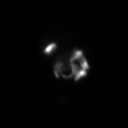
[frame 111/120]
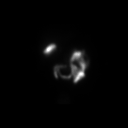

[15 of 15 positions shown; findings below may reference images not displayed]

FINDINGS: There is immediate homogeneous uptake of radiotracer in the liver.
Filling of the gallbladder begins at 10 minutes. Radiotracer uptake
is present in the small bowel at 50 minutes.

At the end of 1-hour, there is relatively good clearance of activity
from the liver.

When gallbladder filling was complete, the patient was given an IV
infusion of CCK over 30 min. The gallbladder ejection fraction
measures 77% at 60 min. At 60 min, normal ejection fraction is
greater than 33%.

The patient did experience symptoms during CCK infusion.
IMPRESSION: 1. Normal hepatobiliary scan.
2. Normal gallbladder ejection fraction.

## 2014-03-05 IMAGING — US ABDOMEN ULTRASOUND LIMITED
1 series · 14 of 25 positions shown · non-contrast
Comparison: None.

CLINICAL DATA: Right upper quadrant pain

EXAM:
US ABDOMEN LIMITED - RIGHT UPPER QUADRANT

[Series 1: abdomen ultrasound limited · 0.35mm/px · 14 of 62 slices shown]
[im 1/62]
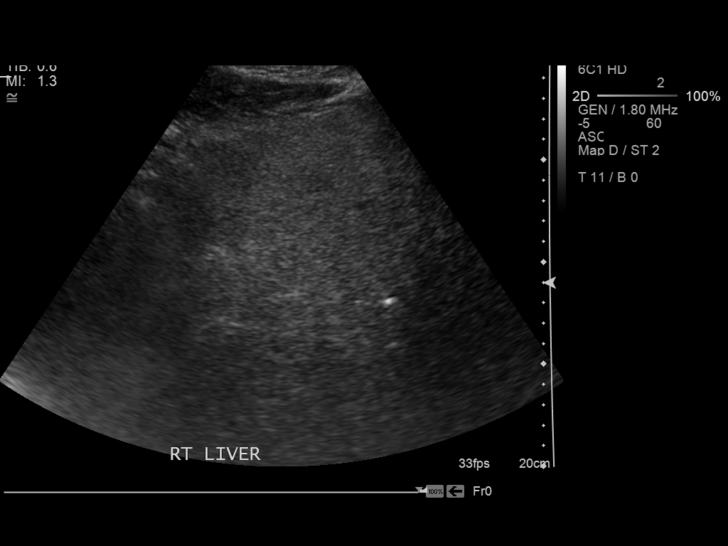
[im 6/62]
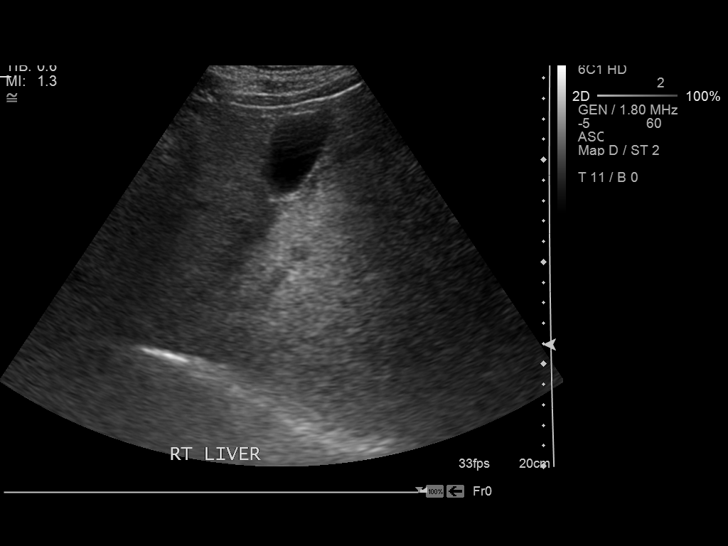
[im 11/62]
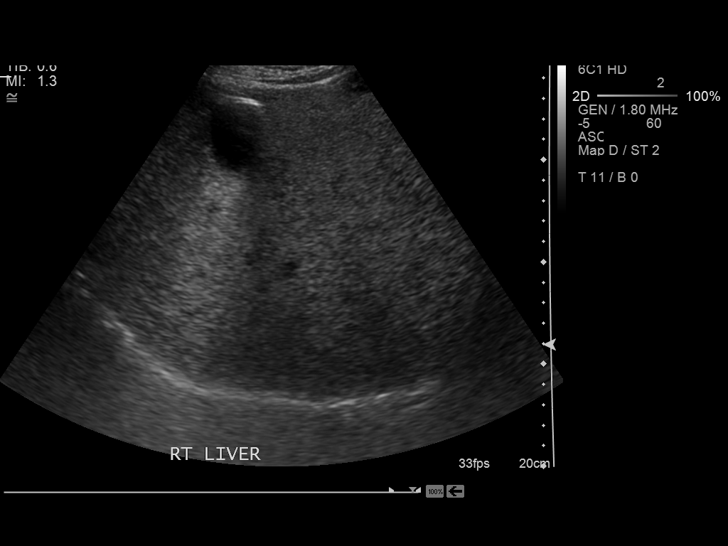
[im 16/62]
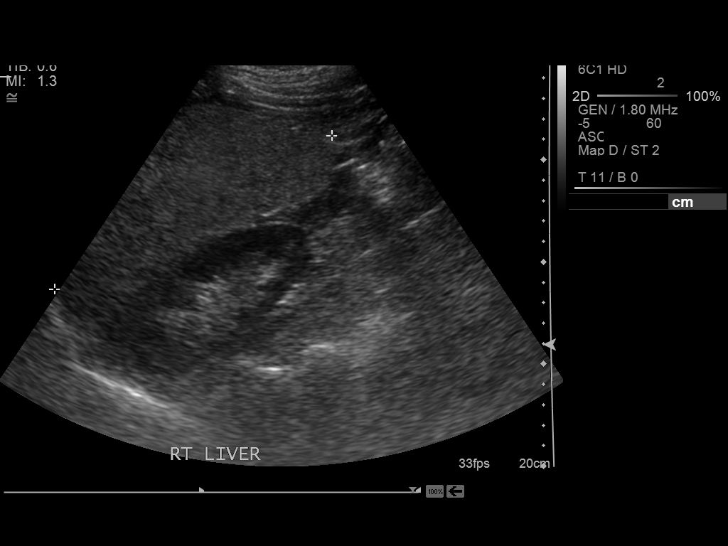
[im 21/62]
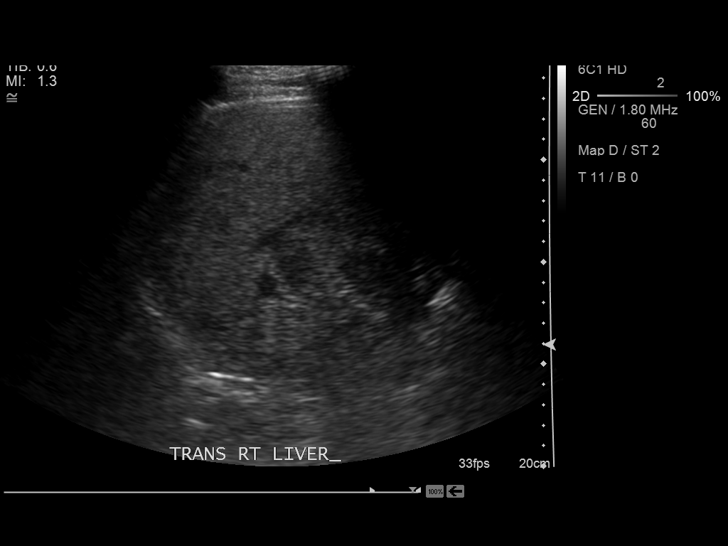
[im 23/62]
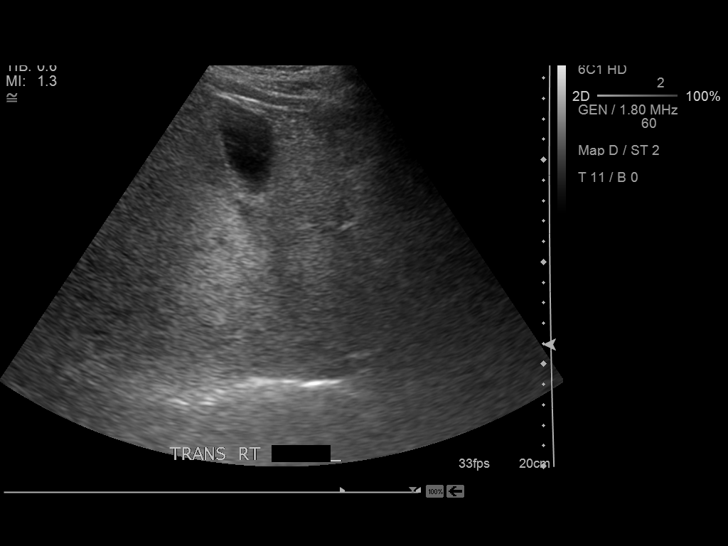
[im 28/62]
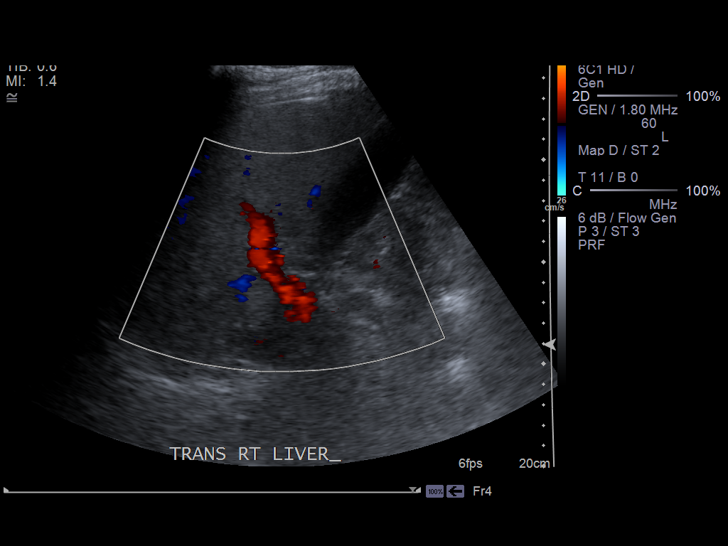
[im 34/62]
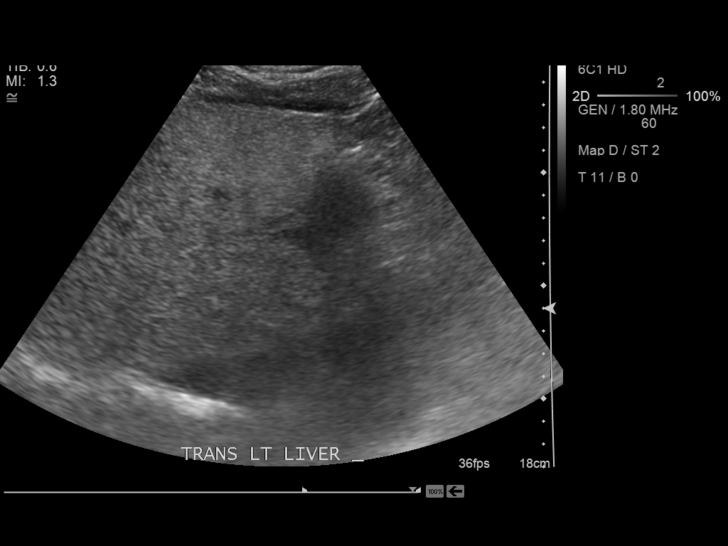
[im 39/62]
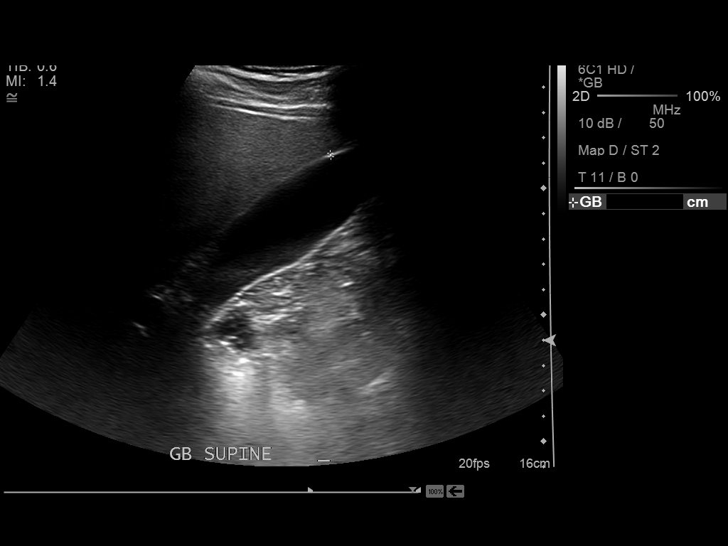
[im 41/62]
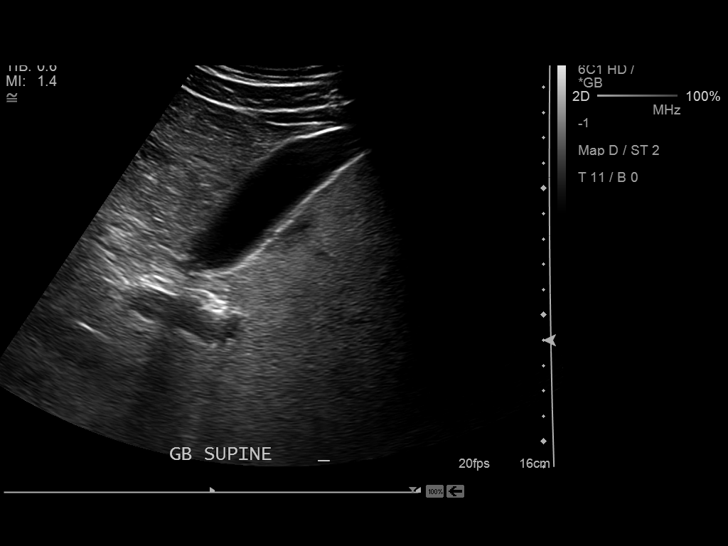
[im 46/62]
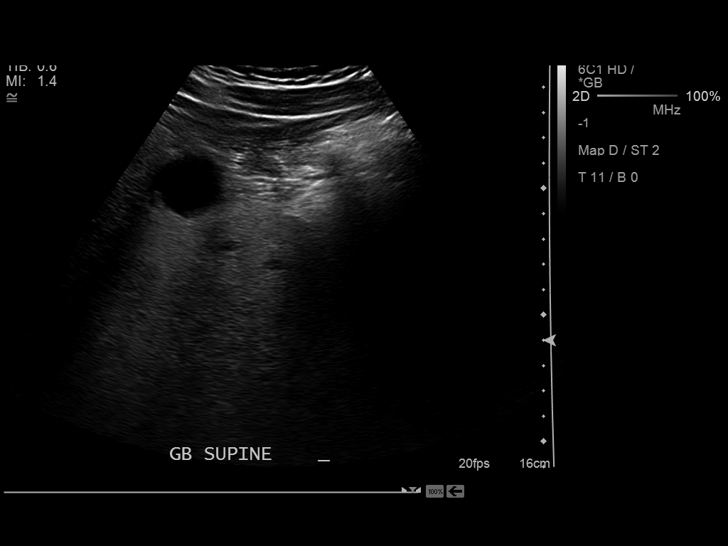
[im 51/62]
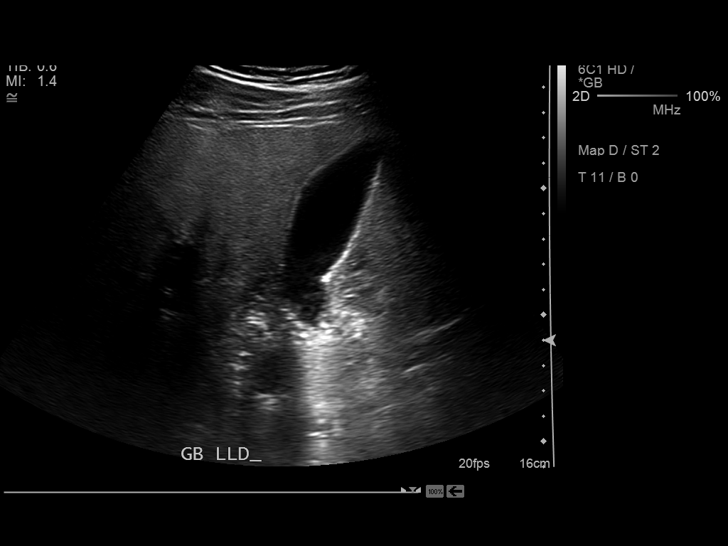
[im 56/62]
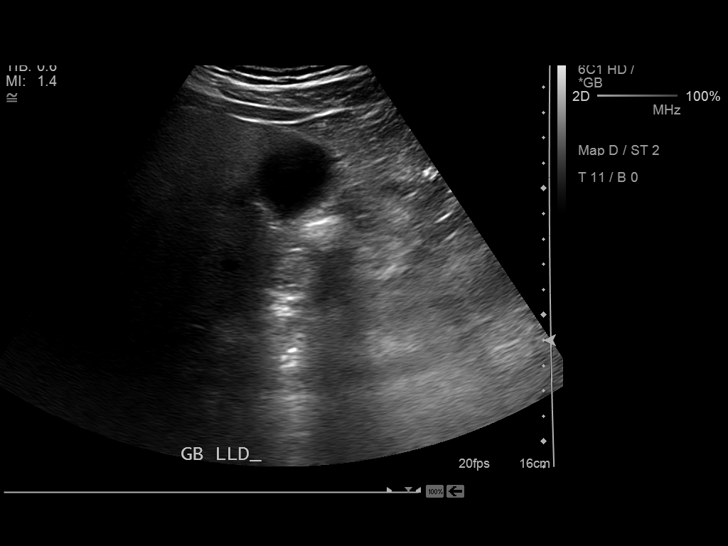
[im 62/62]
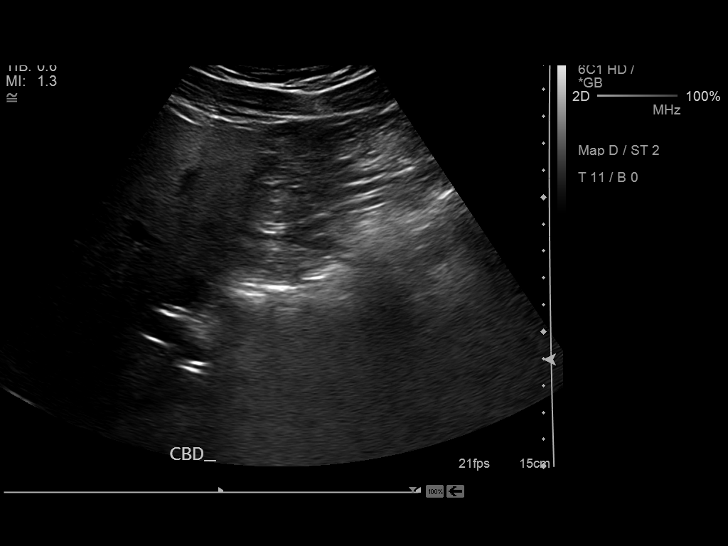

[14 of 25 positions shown; findings below may reference images not displayed]

FINDINGS: Gallbladder:

No shadowing gallstones are noted within gallbladder. There is small
layering sludge within gallbladder. No thickening of gallbladder
wall. No sonographic Murphy's sign.

Common bile duct:

Diameter: 4 mm in diameter within normal limits.

Liver:

No focal hepatic mass. There is coarse increased echogenicity of the
liver consistent with fatty infiltration.
IMPRESSION: 1. No shadowing gallstones are noted within gallbladder. Layering
gallbladder sludge. No sonographic Murphy's sign. No thickening of
gallbladder wall.
2. Normal CBD.
3. Probable fatty infiltration of the liver.

## 2014-05-04 ENCOUNTER — Emergency Department: Payer: Self-pay | Admitting: Emergency Medicine

## 2014-05-04 LAB — BASIC METABOLIC PANEL
Anion Gap: 7 (ref 7–16)
BUN: 12 mg/dL (ref 7–18)
CO2: 29 mmol/L (ref 21–32)
CREATININE: 0.89 mg/dL (ref 0.60–1.30)
Calcium, Total: 8.4 mg/dL — ABNORMAL LOW (ref 8.5–10.1)
Chloride: 105 mmol/L (ref 98–107)
EGFR (African American): >60
EGFR (Non-African Amer.): >60
Glucose: 103 mg/dL — ABNORMAL HIGH (ref 65–99)
Osmolality: 281 (ref 275–301)
POTASSIUM: 3.7 mmol/L (ref 3.5–5.1)
Sodium: 141 mmol/L (ref 136–145)

## 2014-05-04 LAB — CBC
HCT: 45.4 % (ref 40.0–52.0)
HGB: 15.6 g/dL (ref 13.0–18.0)
MCH: 32.4 pg (ref 26.0–34.0)
MCHC: 34.4 g/dL (ref 32.0–36.0)
MCV: 94 fL (ref 80–100)
Platelet: 178 10*3/uL (ref 150–440)
RBC: 4.82 10*6/uL (ref 4.40–5.90)
RDW: 12.6 % (ref 11.5–14.5)
WBC: 16 10*3/uL — ABNORMAL HIGH (ref 3.8–10.6)

## 2014-05-04 LAB — TROPONIN I: Troponin-I: <0.02 ng/mL

## 2014-05-04 IMAGING — CR DG CHEST 2V
1 series · 2 of 2 positions shown · non-contrast
Comparison: CT chest [DATE]

CLINICAL DATA: Left-sided chest pain since 8 p.m..

EXAM:
CHEST  2 VIEW

[Series 1: w chest pa · 0.14mm/px · 2 of 2 slices shown]
[im 1/2]
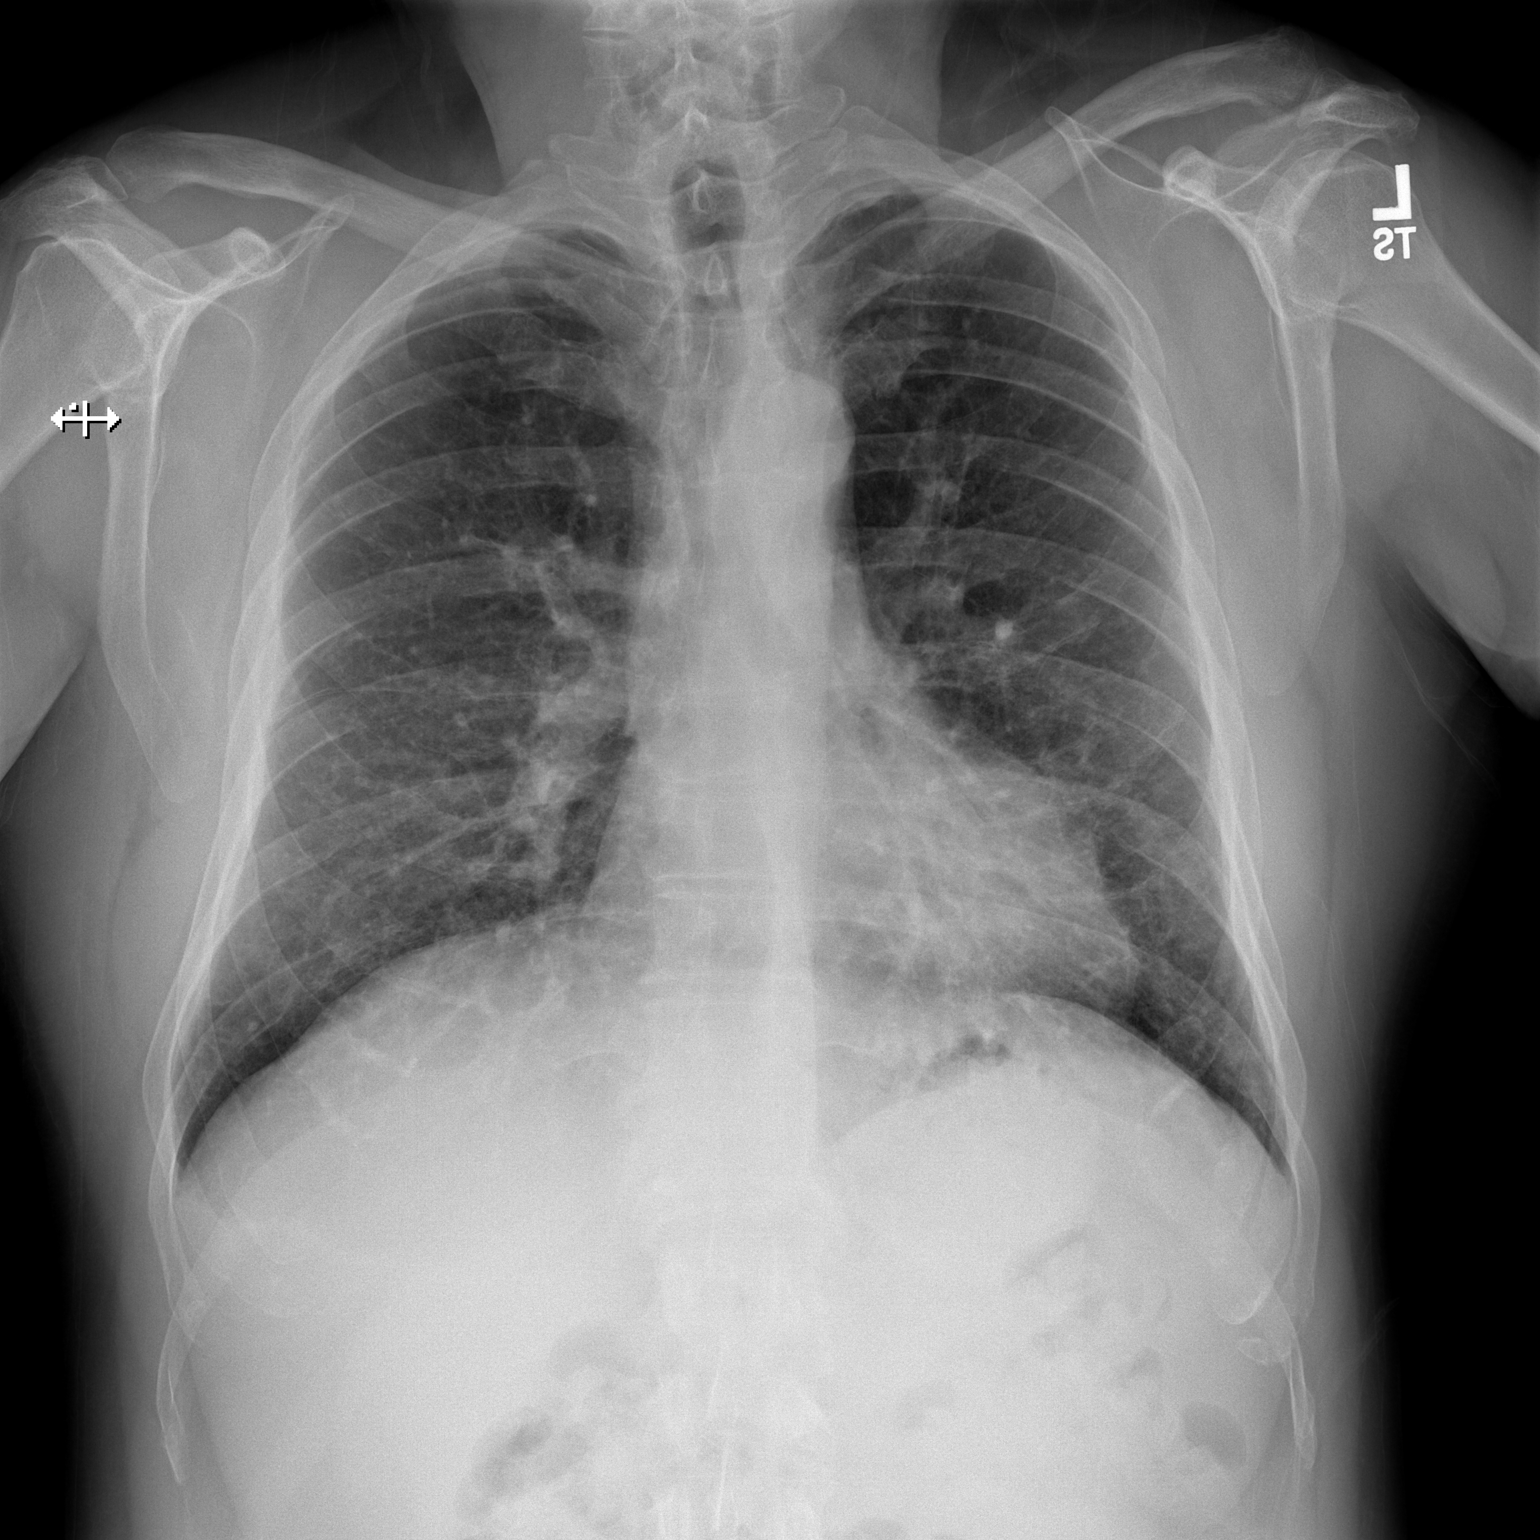
[im 2/2]
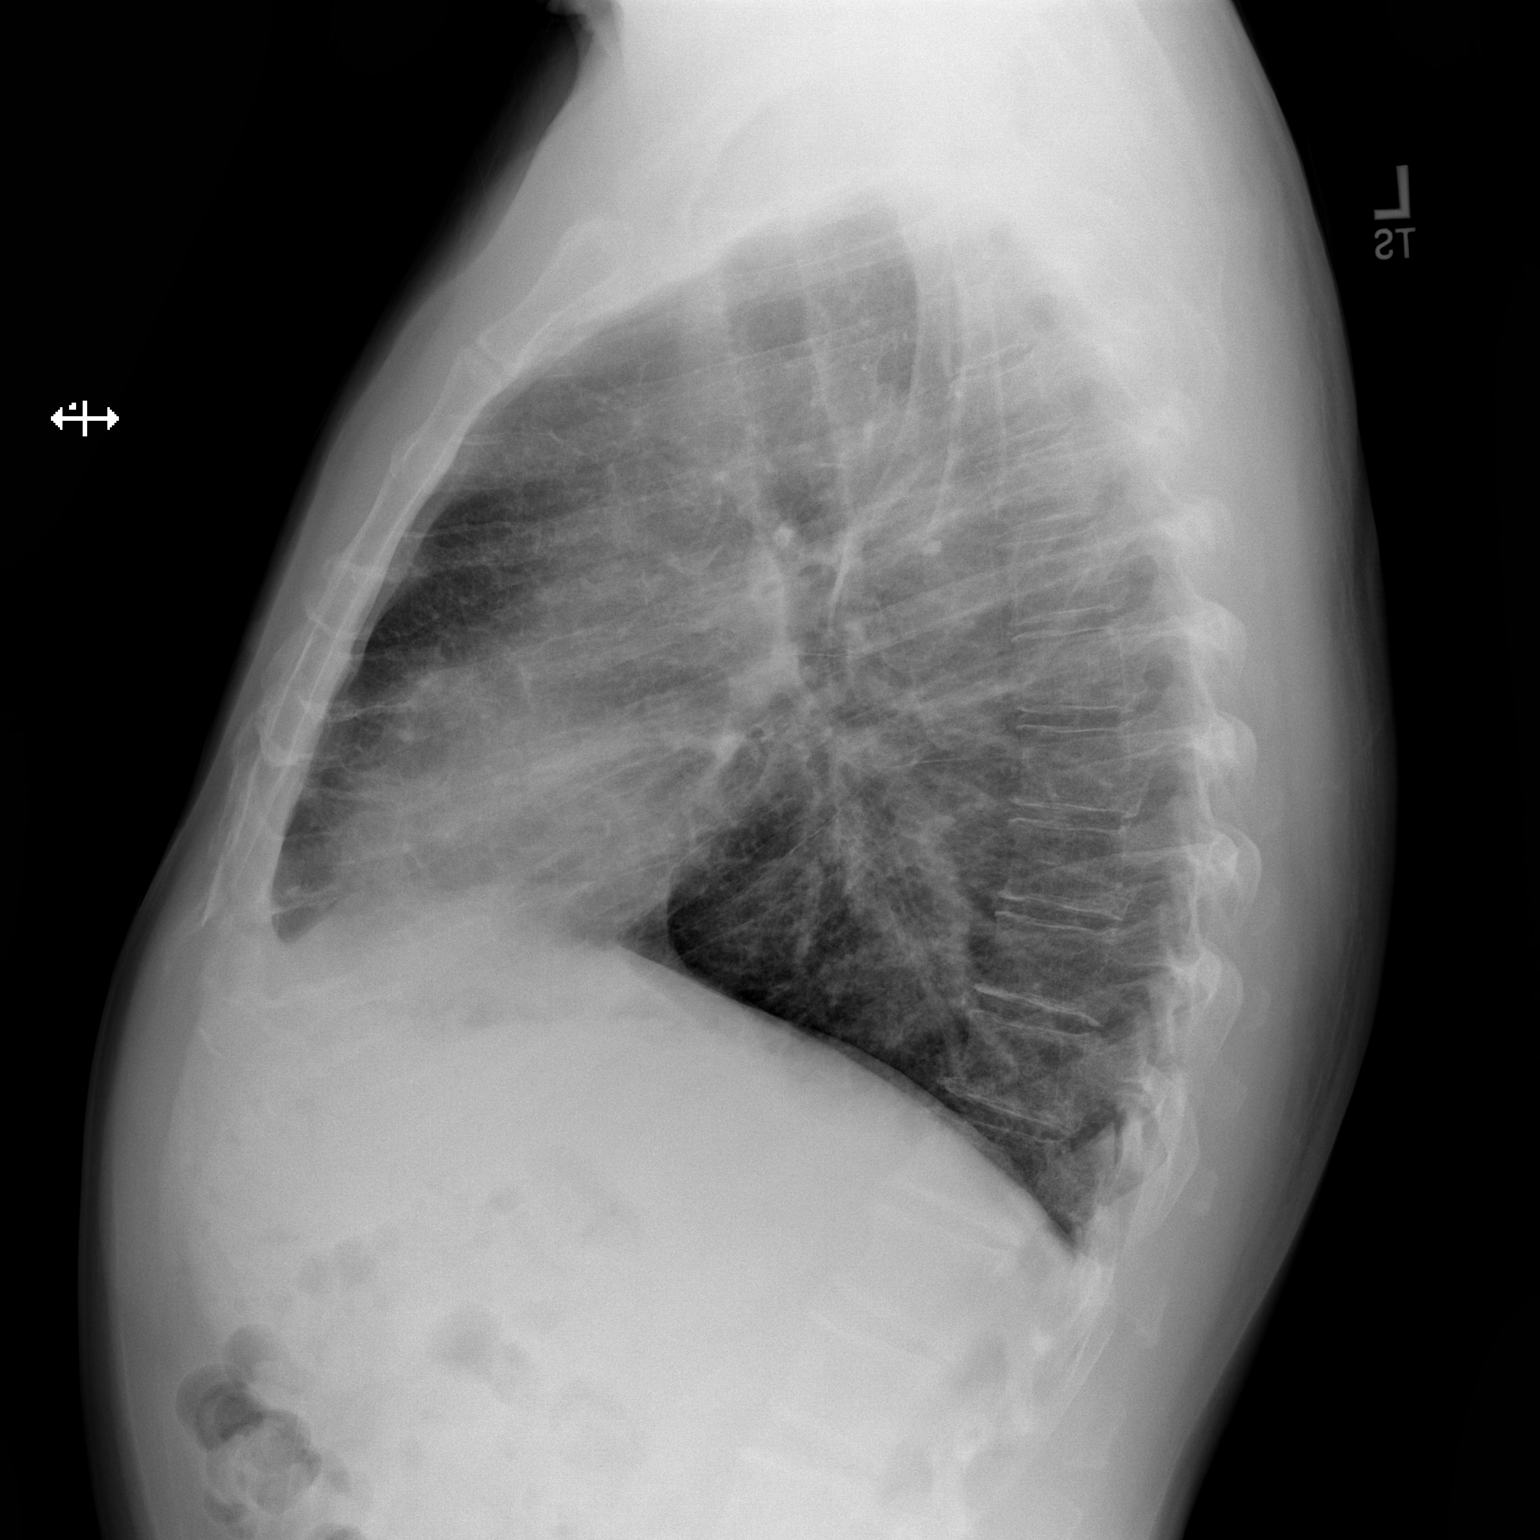

[2 of 2 positions shown; findings below may reference images not displayed]

FINDINGS: Bilateral diffuse interstitial thickening and peribronchial cuffing
most concerning for acute versus chronic bronchitis. There is no
focal parenchymal opacity, pleural effusion, or pneumothorax. The
heart and mediastinal contours are unremarkable.

The osseous structures are unremarkable.
IMPRESSION: Bilateral diffuse interstitial thickening and peribronchial cuffing
most concerning for acute versus chronic bronchitis.

## 2014-05-05 LAB — D-DIMER(ARMC): D-DIMER: 258 ng/mL

## 2014-05-05 LAB — TROPONIN I

## 2014-11-21 ENCOUNTER — Other Ambulatory Visit: Payer: Self-pay

## 2014-12-11 ENCOUNTER — Other Ambulatory Visit: Payer: Self-pay | Admitting: Family Medicine

## 2014-12-11 MED ORDER — ESOMEPRAZOLE MAGNESIUM 40 MG PO CPDR: 40.0000 mg | DELAYED_RELEASE_CAPSULE | Freq: Every day | ORAL | Status: DC

## 2014-12-11 MED ORDER — SERTRALINE HCL 25 MG PO TABS: 25.0000 mg | ORAL_TABLET | Freq: Every day | ORAL | Status: DC

## 2015-01-03 ENCOUNTER — Ambulatory Visit (INDEPENDENT_AMBULATORY_CARE_PROVIDER_SITE_OTHER): Payer: Commercial Indemnity | Admitting: Family Medicine

## 2015-01-03 ENCOUNTER — Encounter: Payer: Self-pay | Admitting: Family Medicine

## 2015-01-03 VITALS — BP 142/84 | HR 67 | Temp 98.0°F | Resp 16 | Ht 65.0 in | Wt 165.8 lb

## 2015-01-03 DIAGNOSIS — K219 Gastro-esophageal reflux disease without esophagitis: Secondary | ICD-10-CM | POA: Insufficient documentation

## 2015-01-03 DIAGNOSIS — R030 Elevated blood-pressure reading, without diagnosis of hypertension: Secondary | ICD-10-CM | POA: Diagnosis not present

## 2015-01-03 DIAGNOSIS — Z8249 Family history of ischemic heart disease and other diseases of the circulatory system: Secondary | ICD-10-CM

## 2015-01-03 DIAGNOSIS — Z720 Tobacco use: Secondary | ICD-10-CM

## 2015-01-03 DIAGNOSIS — F418 Other specified anxiety disorders: Secondary | ICD-10-CM

## 2015-01-03 DIAGNOSIS — F329 Major depressive disorder, single episode, unspecified: Secondary | ICD-10-CM | POA: Insufficient documentation

## 2015-01-03 DIAGNOSIS — IMO0001 Reserved for inherently not codable concepts without codable children: Secondary | ICD-10-CM

## 2015-01-03 DIAGNOSIS — F419 Anxiety disorder, unspecified: Principal | ICD-10-CM

## 2015-01-03 MED ORDER — ESOMEPRAZOLE MAGNESIUM 40 MG PO CPDR: 40.0000 mg | DELAYED_RELEASE_CAPSULE | Freq: Every day | ORAL | Status: DC

## 2015-01-03 MED ORDER — SERTRALINE HCL 25 MG PO TABS: 25.0000 mg | ORAL_TABLET | Freq: Every day | ORAL | Status: DC

## 2015-01-03 NOTE — Patient Instructions (Signed)
Renewed acid reflux medication today. Please call if you have severe heartburn, trouble swallowing or other concerning symptoms.  Your BP is elevated in the office today. This is likely due to stress. Please check at home over the next few weeks and we will follow-up in 3 weeks to determine if you need a medication.   Please seek immediate medical attention at ER or Urgent Care if you develop: Chest pain, pressure or tightness. Shortness of breath accompanied by nausea or diaphoresis Visual changes Numbness or tingling on one side of the body Facial droop Altered mental status Or any concerning symptoms.

## 2015-01-03 NOTE — Assessment & Plan Note (Signed)
Continue nexium. Check CBC, CMP. Recommended ranitidine 150mg  at bedtime to help with occasional night time symptoms. Pt reported he would like to continue with tums PRN.  Reviewed alarm symptoms.  RTC 6 mos.

## 2015-01-03 NOTE — Assessment & Plan Note (Signed)
Pt reports he is too anxious at this time to consider quitting. He has tried in the past. Referred to [facility] resources.

## 2015-01-03 NOTE — Assessment & Plan Note (Addendum)
Continue current dose of Zoloft. Discussed titration to therapeutic dose with patient, he declined. He stated he is feeling bad today due to brother's illness.  Encouraged 30 minutes of exercise daily to help with stress and anxiety.

## 2015-01-03 NOTE — Progress Notes (Signed)
Subjective:    Patient ID: Brian Wolfe, male    DOB: June 25, 1960, 54 y.o.   MRN: [id number]  HPI: Brian Wolfe is a 54 y.o. male presenting on 01/03/2015 for Follow-up; Gastrophageal Reflux; and Depression   Gastrophageal Reflux He complains of heartburn (late in the evening. ). He reports no abdominal pain, no chest pain, no choking, no dysphagia, no nausea or no wheezing. This is a chronic problem. The problem occurs occasionally. The heartburn duration is several minutes. The heartburn does not wake him from sleep. The heartburn does not limit his activity. The heartburn doesn't change with position. The symptoms are aggravated by certain foods and stress. Risk factors include smoking/tobacco exposure. He has tried an antacid and a PPI for the symptoms. The treatment provided moderate relief.   Depression/ Anxiety: Pt is requesting refills of Zoloft today. He was originally prescribed for urological problems but noticed it help him control his temper and relax. PT has been on this medication for 8 years. He reports it is working very well at the 41m dose. He is reporting current stress in his life but does not want to change the medication. He is currently having family issues.   Past Medical History  Diagnosis Date  . Headache     No current outpatient prescriptions on file prior to visit.   No current facility-administered medications on file prior to visit.    Review of Systems  Constitutional: Negative for fever and chills.  HENT: Negative.   Respiratory: Negative for choking, chest tightness, shortness of breath and wheezing.   Cardiovascular: Negative for chest pain, palpitations and leg swelling.  Gastrointestinal: Positive for heartburn (late in the evening. ). Negative for dysphagia, nausea, vomiting and abdominal pain.  Endocrine: Negative.   Genitourinary: Negative for dysuria, urgency, discharge, penile pain and testicular pain.  Musculoskeletal: Negative for  back pain, joint swelling and arthralgias.  Skin: Negative.   Neurological: Negative for dizziness, weakness, numbness and headaches.  Psychiatric/Behavioral: Negative for sleep disturbance and dysphoric mood.   Per HPI unless specifically indicated above     Objective:    BP 142/84 mmHg  Pulse 67  Temp(Src) 98 F (36.7 C) (Oral)  Resp 16  Ht 5' 5" (1.651 m)  Wt 165 lb 12.8 oz (75.206 kg)  BMI 27.59 kg/m2  Wt Readings from Last 3 Encounters:  01/03/15 165 lb 12.8 oz (75.206 kg)    Physical Exam  Constitutional: He is oriented to person, place, and time. He appears well-developed and well-nourished. No distress.  Neck: Normal range of motion. Neck supple. No thyromegaly present.  Cardiovascular: Normal rate and regular rhythm.  Exam reveals no gallop and no friction rub.   No murmur heard. Pulmonary/Chest: Effort normal and breath sounds normal.  Abdominal: Soft. Bowel sounds are normal. There is no tenderness. There is no rebound.  Musculoskeletal: Normal range of motion. He exhibits no edema or tenderness.  Lymphadenopathy:    He has no cervical adenopathy.  Neurological: He is alert and oriented to person, place, and time.  Skin: Skin is warm and dry. He is not diaphoretic.  Psychiatric: His speech is normal and behavior is normal. Judgment and thought content normal. His mood appears anxious (brother is in the hospital. ). Cognition and memory are normal.   Results for orders placed or performed in visit on 05/04/14  Troponin I  Result Value Ref Range   Troponin-I < 0.02 ng/mL  CBC  Result Value Ref Range  WBC 16.0 (H) 3.8-10.6 x10 3/mm 3   RBC 4.82 4.40-5.90 x10 6/mm 3   HGB 15.6 13.0-18.0 g/dL   HCT 45.4 40.0-52.0 %   MCV 94 80-100 fL   MCH 32.4 26.0-34.0 pg   MCHC 34.4 32.0-36.0 g/dL   RDW 12.6 11.5-14.5 %   Platelet 178 150-440 x10 3/mm 3  Basic metabolic panel  Result Value Ref Range   Glucose 103 (H) 65-99 mg/dL   BUN 12 7-18 mg/dL   Creatinine 0.89  0.60-1.30 mg/dL   Sodium 141 136-145 mmol/L   Potassium 3.7 3.5-5.1 mmol/L   Chloride 105 98-107 mmol/L   Co2 29 21-32 mmol/L   Calcium, Total 8.4 (L) 8.5-10.1 mg/dL   Osmolality 281 275-301   Anion Gap 7 7-16   EGFR (African American) >60 >77m/min   EGFR (Non-African Amer.) >60 >641mmin  D-Dimer ([facility] Result Value Ref Range   D-Dimer 258 ng/ml  Troponin I  Result Value Ref Range   Troponin-I < 0.02 ng/mL      Assessment & Plan:   Problem List Items Addressed This Visit      Digestive   GERD (gastroesophageal reflux disease)    Continue nexium. Check CBC, CMP. Recommended ranitidine 15052mt bedtime to help with occasional night time symptoms. Pt reported he would like to continue with tums PRN.  Reviewed alarm symptoms.  RTC 6 mos.       Relevant Medications   esomeprazole (NEXIUM) 40 MG capsule   Other Relevant Orders   CBC with Differential   Comprehensive Metabolic Panel (CMET)     Other   Anxiety and depression - Primary    Continue current dose of Zoloft. Discussed titration to therapeutic dose with patient, he declined. He stated he is feeling bad today due to brother's illness.  Encouraged 30 minutes of exercise daily to help with stress and anxiety.       Relevant Medications   sertraline (ZOLOFT) 25 MG tablet   Tobacco user    Pt reports he is too anxious at this time to consider quitting. He has tried in the past. [facility].        Other Visit Diagnoses    Family history of heart disease        Check lipid panel.     Relevant Orders    Lipid Profile    Elevated BP        Elevated today, likely stress. Pt will check at home 2-3 weeks and RTC for BP check. Consider medication if still elevated. Reviewed diet and lifestyle changes.       Meds ordered this encounter  Medications  . ANDROGEL PUMP 20.25 MG/ACT (1.62%) GEL    Sig:   . esomeprazole (NEXIUM) 40 MG capsule    Sig: Take 1 capsule (40 mg total) by mouth daily.     Dispense:  30 capsule    Refill:  11    Patient needs office appointment to continue to get any medication./    Order Specific Question:  Supervising Provider    Answer:  HAWArlis Porta7[[id number]] sertraline (ZOLOFT) 25 MG tablet    Sig: Take 1 tablet (25 mg total) by mouth daily.    Dispense:  30 tablet    Refill:  11    Patient needs appt. In office to cont. To get any medicine.    Order Specific Question:  Supervising Provider    Answer:  HAWArlis Porta  [id number]]      Follow up plan: Return in about 3 weeks (around 01/24/2015), or if symptoms worsen or fail to improve.

## 2015-01-06 LAB — CBC WITH DIFFERENTIAL/PLATELET
BASOS ABS: 0 10*3/uL (ref 0.0–0.2)
Basos: 0 %
EOS (ABSOLUTE): 0.2 10*3/uL (ref 0.0–0.4)
Eos: 2 %
Hematocrit: 43 % (ref 37.5–51.0)
Hemoglobin: 15.3 g/dL (ref 12.6–17.7)
IMMATURE GRANS (ABS): 0 10*3/uL (ref 0.0–0.1)
IMMATURE GRANULOCYTES: 0 %
Lymphocytes Absolute: 2.4 10*3/uL (ref 0.7–3.1)
Lymphs: 31 %
MCH: 32.7 pg (ref 26.6–33.0)
MCHC: 35.6 g/dL (ref 31.5–35.7)
MCV: 92 fL (ref 79–97)
MONOCYTES: 5 %
MONOS ABS: 0.4 10*3/uL (ref 0.1–0.9)
NEUTROS PCT: 62 %
Neutrophils Absolute: 4.8 10*3/uL (ref 1.4–7.0)
Platelets: 217 10*3/uL (ref 150–379)
RBC: 4.68 x10E6/uL (ref 4.14–5.80)
RDW: 13 % (ref 12.3–15.4)
WBC: 7.8 10*3/uL (ref 3.4–10.8)

## 2015-01-07 ENCOUNTER — Telehealth: Payer: Self-pay | Admitting: Family Medicine

## 2015-01-07 DIAGNOSIS — E785 Hyperlipidemia, unspecified: Secondary | ICD-10-CM | POA: Insufficient documentation

## 2015-01-07 DIAGNOSIS — E1169 Type 2 diabetes mellitus with other specified complication: Secondary | ICD-10-CM | POA: Insufficient documentation

## 2015-01-07 LAB — LIPID PANEL
CHOL/HDL RATIO: 4.5 ratio (ref 0.0–5.0)
CHOLESTEROL TOTAL: 193 mg/dL (ref 100–199)
HDL: 43 mg/dL (ref >39)
LDL Calculated: 127 mg/dL — ABNORMAL HIGH (ref 0–99)
Triglycerides: 114 mg/dL (ref 0–149)
VLDL Cholesterol Cal: 23 mg/dL (ref 5–40)

## 2015-01-07 LAB — COMPREHENSIVE METABOLIC PANEL
ALBUMIN: 4.7 g/dL (ref 3.5–5.5)
ALT: 22 IU/L (ref 0–44)
AST: 16 IU/L (ref 0–40)
Albumin/Globulin Ratio: 1.7 (ref 1.1–2.5)
Alkaline Phosphatase: 68 IU/L (ref 39–117)
BILIRUBIN TOTAL: 0.5 mg/dL (ref 0.0–1.2)
BUN / CREAT RATIO: 19 (ref 9–20)
BUN: 16 mg/dL (ref 6–24)
CHLORIDE: 102 mmol/L (ref 97–108)
CO2: 21 mmol/L (ref 18–29)
Calcium: 9.9 mg/dL (ref 8.7–10.2)
Creatinine, Ser: 0.83 mg/dL (ref 0.76–1.27)
GFR calc non Af Amer: 101 mL/min/{1.73_m2} (ref >59)
GFR, EST AFRICAN AMERICAN: 117 mL/min/{1.73_m2} (ref >59)
Globulin, Total: 2.7 g/dL (ref 1.5–4.5)
Glucose: 80 mg/dL (ref 65–99)
Potassium: 4.8 mmol/L (ref 3.5–5.2)
SODIUM: 141 mmol/L (ref 134–144)
TOTAL PROTEIN: 7.4 g/dL (ref 6.0–8.5)

## 2015-01-07 MED ORDER — ATORVASTATIN CALCIUM 20 MG PO TABS: 20.0000 mg | ORAL_TABLET | Freq: Every day | ORAL | Status: DC

## 2015-01-07 NOTE — Telephone Encounter (Signed)
Called pt to review lab results. CMP is normal. Lipid panel: LDL mildly elevated but pt ASCVD risk is 11.4%- we dicussed statin therapy. Pt is amenable Side effects reviewed. Encouraged diet and lifestyle modifications including smoking cessation.

## 2015-04-02 ENCOUNTER — Encounter: Payer: Self-pay | Admitting: *Deleted

## 2015-04-14 ENCOUNTER — Ambulatory Visit (INDEPENDENT_AMBULATORY_CARE_PROVIDER_SITE_OTHER): Payer: Commercial Indemnity | Admitting: Urology

## 2015-04-14 ENCOUNTER — Encounter: Payer: Self-pay | Admitting: Urology

## 2015-04-14 VITALS — BP 114/77 | HR 76 | Ht 65.0 in | Wt 168.5 lb

## 2015-04-14 DIAGNOSIS — R972 Elevated prostate specific antigen [PSA]: Secondary | ICD-10-CM | POA: Diagnosis not present

## 2015-04-14 DIAGNOSIS — N401 Enlarged prostate with lower urinary tract symptoms: Secondary | ICD-10-CM

## 2015-04-14 DIAGNOSIS — N4231 Prostatic intraepithelial neoplasia: Secondary | ICD-10-CM | POA: Diagnosis not present

## 2015-04-14 DIAGNOSIS — N138 Other obstructive and reflux uropathy: Secondary | ICD-10-CM

## 2015-04-14 DIAGNOSIS — N528 Other male erectile dysfunction: Secondary | ICD-10-CM | POA: Diagnosis not present

## 2015-04-14 DIAGNOSIS — E291 Testicular hypofunction: Secondary | ICD-10-CM

## 2015-04-14 DIAGNOSIS — N400 Enlarged prostate without lower urinary tract symptoms: Secondary | ICD-10-CM | POA: Insufficient documentation

## 2015-04-14 MED ORDER — ANDROGEL PUMP 20.25 MG/ACT (1.62%) TD GEL: 2.0000 | Freq: Every morning | TRANSDERMAL | Status: DC
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
## 2015-04-15 DIAGNOSIS — N401 Enlarged prostate with lower urinary tract symptoms: Secondary | ICD-10-CM

## 2015-04-15 DIAGNOSIS — E291 Testicular hypofunction: Secondary | ICD-10-CM | POA: Insufficient documentation

## 2015-04-15 DIAGNOSIS — N138 Other obstructive and reflux uropathy: Secondary | ICD-10-CM | POA: Insufficient documentation

## 2015-04-15 DIAGNOSIS — N529 Male erectile dysfunction, unspecified: Secondary | ICD-10-CM | POA: Insufficient documentation

## 2015-04-15 LAB — HEMATOCRIT: HEMATOCRIT: 41.4 % (ref 37.5–51.0)

## 2015-04-15 LAB — TESTOSTERONE: Testosterone: 348 ng/dL (ref 348–1197)

## 2015-04-15 LAB — PSA: PROSTATE SPECIFIC AG, SERUM: 2.8 ng/mL (ref 0.0–4.0)

## 2015-04-17 ENCOUNTER — Telehealth: Payer: Self-pay

## 2015-04-17 NOTE — Telephone Encounter (Signed)
Spoke with pt in reference to lab results. Pt voiced understanding.  

## 2015-04-17 NOTE — Telephone Encounter (Signed)
-----   Message from Nori Riis, PA-C sent at 04/17/2015  9:01 AM EDT ----- Patient's labs are stable.  We will see him in 6 months.

## 2015-05-09 ENCOUNTER — Telehealth: Payer: Self-pay

## 2015-05-09 DIAGNOSIS — E291 Testicular hypofunction: Secondary | ICD-10-CM

## 2015-05-09 NOTE — Telephone Encounter (Signed)
Pt pharmacy sent a refill for androgel. Please advise.

## 2015-05-11 NOTE — Telephone Encounter (Signed)
Okay to refill? 

## 2015-05-12 MED ORDER — ANDROGEL PUMP 20.25 MG/ACT (1.62%) TD GEL: 2.0000 | Freq: Every morning | TRANSDERMAL | Status: DC

## 2015-05-12 NOTE — Telephone Encounter (Signed)
Refill sent to pt pharmacy 

## 2015-07-28 ENCOUNTER — Ambulatory Visit: Payer: Commercial Indemnity | Admitting: Family Medicine

## 2015-07-29 ENCOUNTER — Ambulatory Visit (INDEPENDENT_AMBULATORY_CARE_PROVIDER_SITE_OTHER): Payer: Managed Care, Other (non HMO) | Admitting: Family Medicine

## 2015-07-29 ENCOUNTER — Encounter: Payer: Self-pay | Admitting: Family Medicine

## 2015-07-29 VITALS — BP 131/79 | HR 62 | Temp 97.9°F | Resp 16 | Ht 65.0 in | Wt 166.8 lb

## 2015-07-29 DIAGNOSIS — B353 Tinea pedis: Secondary | ICD-10-CM | POA: Diagnosis not present

## 2015-07-29 DIAGNOSIS — F418 Other specified anxiety disorders: Secondary | ICD-10-CM

## 2015-07-29 DIAGNOSIS — F32A Depression, unspecified: Secondary | ICD-10-CM

## 2015-07-29 DIAGNOSIS — F419 Anxiety disorder, unspecified: Secondary | ICD-10-CM

## 2015-07-29 DIAGNOSIS — E785 Hyperlipidemia, unspecified: Secondary | ICD-10-CM | POA: Diagnosis not present

## 2015-07-29 DIAGNOSIS — K219 Gastro-esophageal reflux disease without esophagitis: Secondary | ICD-10-CM

## 2015-07-29 DIAGNOSIS — F329 Major depressive disorder, single episode, unspecified: Secondary | ICD-10-CM

## 2015-07-29 DIAGNOSIS — L03031 Cellulitis of right toe: Secondary | ICD-10-CM

## 2015-07-29 MED ORDER — COENZYME Q10 30 MG PO CAPS: 30.0000 mg | ORAL_CAPSULE | Freq: Every day | ORAL | Status: DC

## 2015-07-29 MED ORDER — TERBINAFINE HCL 1 % EX CREA
1.0000 | TOPICAL_CREAM | Freq: Two times a day (BID) | CUTANEOUS | Status: DC
Start: 2015-07-29 — End: 2016-02-10

## 2015-07-29 MED ORDER — PANTOPRAZOLE SODIUM 40 MG PO TBEC: 40.0000 mg | DELAYED_RELEASE_TABLET | Freq: Every day | ORAL | Status: DC

## 2015-07-29 MED ORDER — CEPHALEXIN 500 MG PO CAPS: 500.0000 mg | ORAL_CAPSULE | Freq: Two times a day (BID) | ORAL | Status: DC

## 2015-07-29 NOTE — Assessment & Plan Note (Signed)
Continue every other day dosing of the statin. Recheck lipids. Consider CoQ10 dosing.  Encouraged heart healthy diet.  Recheck 6 mos.

## 2015-07-29 NOTE — Assessment & Plan Note (Signed)
Change to pantoprazole for insurance purposes. Alarm symptoms reviewed. Avoid food triggers. PRN tums.

## 2015-07-29 NOTE — Assessment & Plan Note (Signed)
Controlled. Continue Zyrtec.

## 2015-07-29 NOTE — Progress Notes (Signed)
Subjective:    Patient ID: Brian Wolfe, male    DOB: 1961/01/27, 55 y.o.   MRN: [id number]  HPI: Brian Wolfe is a 55 y.o. male presenting on 07/29/2015 for Gastroesophageal Reflux; Hyperlipidemia; and Depression   HPI  Pt presents for foolw-up of GERD. Red sauce is a trigger. Uses occasional. No regurg or dysphagia. Does well on Nexium. Insurance will no longer cover. Would like to switch to a different medication.  HLD: No chest pain, SOB, myalgias both hips if he takes statin daily. Currently everyother day dosing- doing well.  Depression: Well controlled. No sleep disturbances. No SI/HI.  Ear pain- x1 mos ago. Had a cerumen impaction- uses drops. Was using syringe- R ear got very pain. No drainage. No fevers.    Cracks on feet, scale and rash between toes. Present x2-3 mos. No OTC treatment.  Had a ingrown toenail he removed at home 1 week - lots and pus and drainage from the side of nail. Still red and tender   Past Medical History  Diagnosis Date  . Headache   . Depression   . GERD (gastroesophageal reflux disease)   . HTN (hypertension)   . Hypogonadism in male   . BPH with obstruction/lower urinary tract symptoms   . Cellulitis of leg   . Skin abscess   . Elevated PSA   . High grade prostatic intraepithelial neoplasia     Current Outpatient Prescriptions on File Prior to Visit  Medication Sig  . ANDROGEL PUMP 20.25 MG/ACT (1.62%) GEL Apply 2 Act topically every morning.  Marland Kitchen atorvastatin (LIPITOR) 20 MG tablet Take 1 tablet (20 mg total) by mouth daily.  Marland Kitchen esomeprazole (NEXIUM) 40 MG capsule Take 1 capsule (40 mg total) by mouth daily.  . sertraline (ZOLOFT) 25 MG tablet Take 1 tablet (25 mg total) by mouth daily.   No current facility-administered medications on file prior to visit.    Review of Systems  Constitutional: Negative for fever and chills.  HENT: Negative.   Respiratory: Negative for chest tightness, shortness of breath and wheezing.     Cardiovascular: Negative for chest pain, palpitations and leg swelling.  Gastrointestinal: Negative for nausea, vomiting and abdominal pain.  Endocrine: Negative.   Genitourinary: Negative for dysuria, urgency, discharge, penile pain and testicular pain.  Musculoskeletal: Positive for myalgias. Negative for back pain, joint swelling and arthralgias.  Skin: Positive for rash and wound.  Neurological: Negative for dizziness, weakness, numbness and headaches.  Psychiatric/Behavioral: Negative for sleep disturbance and dysphoric mood.   Per HPI unless specifically indicated above     Objective:    BP 131/79 mmHg  Pulse 62  Temp(Src) 97.9 F (36.6 C) (Oral)  Resp 16  Ht 5\' 5"  (1.651 m)  Wt 166 lb 12.8 oz (75.66 kg)  BMI 27.76 kg/m2  Wt Readings from Last 3 Encounters:  07/29/15 166 lb 12.8 oz (75.66 kg)  04/14/15 168 lb 8 oz (76.431 kg)  01/03/15 165 lb 12.8 oz (75.206 kg)    Physical Exam  Constitutional: He is oriented to person, place, and time. He appears well-developed and well-nourished. No distress.  HENT:  Head: Normocephalic and atraumatic.  Neck: Neck supple. No thyromegaly present.  Cardiovascular: Normal rate, regular rhythm and normal heart sounds.  Exam reveals no gallop and no friction rub.   No murmur heard. Pulmonary/Chest: Effort normal and breath sounds normal. He has no wheezes.  Abdominal: Soft. Bowel sounds are normal. He exhibits no distension. There is no tenderness.  There is no rebound.  Musculoskeletal: Normal range of motion. He exhibits no edema or tenderness.  Neurological: He is alert and oriented to person, place, and time. He has normal reflexes.  Skin: Skin is warm and dry. Rash noted. No erythema.  White crusting and cracking between toes.   Yellow, thick rigid, nail on R great toe. Erythema surrounding toe. No drainage.   Psychiatric: He has a normal mood and affect. His behavior is normal. Thought content normal.   Results for orders placed  or performed in visit on 07/29/15  HM COLONOSCOPY  Result Value Ref Range   HM Colonoscopy polyps       Assessment & Plan:   Problem List Items Addressed This Visit      Digestive   GERD (gastroesophageal reflux disease) - Primary    Change to pantoprazole for insurance purposes. Alarm symptoms reviewed. Avoid food triggers. PRN tums.       Relevant Medications   pantoprazole (PROTONIX) 40 MG tablet     Other   Anxiety and depression    Controlled. Continue Zyrtec.       Hyperlipidemia    Continue every other day dosing of the statin. Recheck lipids. Consider CoQ10 dosing.  Encouraged heart healthy diet.  Recheck 6 mos.       Relevant Orders   Lipid Profile    Other Visit Diagnoses    Tinea pedis of both feet        Lamisil BID x 2 weeks. Change socks frequently.     Relevant Medications    terbinafine (LAMISIL) 1 % cream    cephALEXin (KEFLEX) 500 MG capsule    Paronychia of great toe of right foot        Keflex BID to cover for skin infection given recent pus and drainage. Recommend pt have nail looked at by Podiatry. Referral declined- pt will self refer.     Relevant Medications    terbinafine (LAMISIL) 1 % cream    cephALEXin (KEFLEX) 500 MG capsule       Meds ordered this encounter  Medications  . pantoprazole (PROTONIX) 40 MG tablet    Sig: Take 1 tablet (40 mg total) by mouth daily.    Dispense:  30 tablet    Refill:  11    Order Specific Question:  Supervising Provider    Answer:  Arlis Porta [phone]  . co-enzyme Q-10 30 MG capsule    Sig: Take 1 capsule (30 mg total) by mouth daily.    Order Specific Question:  Supervising Provider    Answer:  Arlis Porta [id number]  . terbinafine (LAMISIL) 1 % cream    Sig: Apply 1 application topically 2 (two) times daily.    Dispense:  30 g    Refill:  2    Order Specific Question:  Supervising Provider    Answer:  Arlis Porta [id number]  . cephALEXin (KEFLEX) 500 MG capsule    Sig:  Take 1 capsule (500 mg total) by mouth 2 (two) times daily.    Dispense:  14 capsule    Refill:  0    Order Specific Question:  Supervising Provider    Answer:  Arlis Porta [id number]      Follow up plan: Return in about 6 months (around 01/26/2016) for GERD, Hyperlipidemia. Marland Kitchen

## 2015-07-29 NOTE — Patient Instructions (Addendum)
Toe: Soak in epsom salts. Take Keflex twice daily for 1 week. Recommend you see a Podiatrist: [facility] - [phone]   Athletes Foot: Apply Lamisil cream twice daily for 2 weeks. Change socks frequently.We will check a lipid panel. Continue to take your Atorvastatin every other day.    Athlete's Foot Athlete's foot (tinea pedis) is a fungal infection of the skin on the feet. It often occurs on the skin between the toes or underneath the toes. It can also occur on the soles of the feet. Athlete's foot is more likely to occur in hot, humid weather. Not washing your feet or changing your socks often enough can contribute to athlete's foot. The infection can spread from person to person (contagious). CAUSES Athlete's foot is caused by a fungus. This fungus thrives in warm, moist places. Most people get athlete's foot by sharing shower stalls, towels, and wet floors with an infected person. People with weakened immune systems, including those with diabetes, may be more likely to get athlete's foot. SYMPTOMS   Itchy areas between the toes or on the soles of the feet.  White, flaky, or scaly areas between the toes or on the soles of the feet.  Tiny, intensely itchy blisters between the toes or on the soles of the feet.  Tiny cuts on the skin. These cuts can develop a bacterial infection.  Thick or discolored toenails. DIAGNOSIS  Your caregiver can usually tell what the problem is by doing a physical exam. Your caregiver may also take a skin sample from the rash area. The skin sample may be examined under a microscope, or it may be tested to see if fungus will grow in the sample. A sample may also be taken from your toenail for testing. TREATMENT  Over-the-counter and prescription medicines can be used to kill the fungus. These medicines are available as powders or creams. Your caregiver can suggest medicines for you. Fungal infections respond slowly to treatment. You may need  to continue using your medicine for several weeks. PREVENTION   Do not share towels.  Wear sandals in wet areas, such as shared locker rooms and shared showers.  Keep your feet dry. Wear shoes that allow air to circulate. Wear cotton or wool socks. HOME CARE INSTRUCTIONS   Take medicines as directed by your caregiver. Do not use steroid creams on athlete's foot.  Keep your feet clean and cool. Wash your feet daily and dry them thoroughly, especially between your toes.  Change your socks every day. Wear cotton or wool socks. In hot climates, you may need to change your socks 2 to 3 times per day.  Wear sandals or canvas tennis shoes with good air circulation.  If you have blisters, soak your feet in Burow's solution or Epsom salts for 20 to 30 minutes, 2 times a day to dry out the blisters. Make sure you dry your feet thoroughly afterward. SEEK MEDICAL CARE IF:   You have a fever.  You have swelling, soreness, warmth, or redness in your foot.  You are not getting better after 7 days of treatment.  You are not completely cured after 30 days.  You have any problems caused by your medicines. MAKE SURE YOU:   Understand these instructions.  Will watch your condition.  Will get help right away if you are not doing well or get worse.   This information is not intended to replace advice given to you by your health care provider. Make sure you  discuss any questions you have with your health care provider.   Document Released: 06/04/2000 Document Revised: 08/30/2011 Document Reviewed: 12/09/2014 Elsevier Interactive Patient Education [employer].

## 2015-07-30 LAB — LIPID PANEL
CHOL/HDL RATIO: 4.5 ratio (ref 0.0–5.0)
Cholesterol, Total: 165 mg/dL (ref 100–199)
HDL: 37 mg/dL — ABNORMAL LOW (ref >39)
LDL Calculated: 86 mg/dL (ref 0–99)
TRIGLYCERIDES: 209 mg/dL — AB (ref 0–149)
VLDL Cholesterol Cal: 42 mg/dL — ABNORMAL HIGH (ref 5–40)

## 2015-08-24 ENCOUNTER — Emergency Department
Admission: EM | Admit: 2015-08-24 | Discharge: 2015-08-24 | Disposition: A | Discharge status: Home / Self Care | Payer: Managed Care, Other (non HMO) | Attending: Emergency Medicine | Admitting: Emergency Medicine

## 2015-08-24 DIAGNOSIS — L02619 Cutaneous abscess of unspecified foot: Secondary | ICD-10-CM

## 2015-08-24 DIAGNOSIS — L02611 Cutaneous abscess of right foot: Secondary | ICD-10-CM | POA: Diagnosis not present

## 2015-08-24 DIAGNOSIS — Z79899 Other long term (current) drug therapy: Secondary | ICD-10-CM | POA: Diagnosis not present

## 2015-08-24 DIAGNOSIS — Z792 Long term (current) use of antibiotics: Secondary | ICD-10-CM | POA: Diagnosis not present

## 2015-08-24 DIAGNOSIS — L03115 Cellulitis of right lower limb: Secondary | ICD-10-CM | POA: Diagnosis not present

## 2015-08-24 DIAGNOSIS — I10 Essential (primary) hypertension: Secondary | ICD-10-CM | POA: Insufficient documentation

## 2015-08-24 DIAGNOSIS — F17210 Nicotine dependence, cigarettes, uncomplicated: Secondary | ICD-10-CM | POA: Diagnosis not present

## 2015-08-24 DIAGNOSIS — M79674 Pain in right toe(s): Secondary | ICD-10-CM | POA: Diagnosis present

## 2015-08-24 DIAGNOSIS — L03119 Cellulitis of unspecified part of limb: Secondary | ICD-10-CM

## 2015-08-24 MED ORDER — SULFAMETHOXAZOLE-TRIMETHOPRIM 800-160 MG PO TABS: 1.0000 | ORAL_TABLET | Freq: Two times a day (BID) | ORAL | Status: DC

## 2015-08-24 MED ORDER — LIDOCAINE HCL (PF) 1 % IJ SOLN
INTRAMUSCULAR | Status: AC
  Filled 2015-08-24: qty 5

## 2015-08-24 MED ORDER — TRAMADOL HCL 50 MG PO TABS: 50.0000 mg | ORAL_TABLET | Freq: Four times a day (QID) | ORAL | Status: DC | PRN

## 2015-08-24 NOTE — ED Notes (Signed)
Pt with red base of great toe to right side. Cms intact to toes. Nail is yellow and cloudy in appearance. Pt denies known injury, denies insect bite, denies fever.

## 2015-08-24 NOTE — ED Notes (Signed)
Pt reports ingrown toe nail on the right foot and states " I have an infection and need a relief."

## 2015-08-24 NOTE — Discharge Instructions (Signed)
Abscess °An abscess is an infected area that contains a collection of pus and debris. It can occur in almost any part of the body. An abscess is also known as a furuncle or boil. °CAUSES  °An abscess occurs when tissue gets infected. This can occur from blockage of oil or sweat glands, infection of hair follicles, or a minor injury to the skin. As the body tries to fight the infection, pus collects in the area and creates pressure under the skin. This pressure causes pain. People with weakened immune systems have difficulty fighting infections and get certain abscesses more often.  °SYMPTOMS °Usually an abscess develops on the skin and becomes a painful mass that is red, warm, and tender. If the abscess forms under the skin, you may feel a moveable soft area under the skin. Some abscesses break open (rupture) on their own, but most will continue to get worse without care. The infection can spread deeper into the body and eventually into the bloodstream, causing you to feel ill.  °DIAGNOSIS  °Your caregiver will take your medical history and perform a physical exam. A sample of fluid may also be taken from the abscess to determine what is causing your infection. °TREATMENT  °Your caregiver may prescribe antibiotic medicines to fight the infection. However, taking antibiotics alone usually does not cure an abscess. Your caregiver may need to make a small cut (incision) in the abscess to drain the pus. In some cases, gauze is packed into the abscess to reduce pain and to continue draining the area. °HOME CARE INSTRUCTIONS  °· Only take over-the-counter or prescription medicines for pain, discomfort, or fever as directed by your caregiver. °· If you were prescribed antibiotics, take them as directed. Finish them even if you start to feel better. °· If gauze is used, follow your caregiver's directions for changing the gauze. °· To avoid spreading the infection: °· Keep your draining abscess covered with a  bandage. °· Wash your hands well. °· Do not share personal care items, towels, or whirlpools with others. °· Avoid skin contact with others. °· Keep your skin and clothes clean around the abscess. °· Keep all follow-up appointments as directed by your caregiver. °SEEK MEDICAL CARE IF:  °· You have increased pain, swelling, redness, fluid drainage, or bleeding. °· You have muscle aches, chills, or a general ill feeling. °· You have a fever. °MAKE SURE YOU:  °· Understand these instructions. °· Will watch your condition. °· Will get help right away if you are not doing well or get worse. °  °This information is not intended to replace advice given to you by your health care provider. Make sure you discuss any questions you have with your health care provider. °  °Document Released: 03/17/2005 Document Revised: 12/07/2011 Document Reviewed: 08/20/2011 °Elsevier Interactive Patient Education ©2016 [employer] ° °Cellulitis °Cellulitis is an infection of the skin and the tissue beneath it. The infected area is usually red and tender. Cellulitis occurs most often in the arms and lower legs.  °CAUSES  °Cellulitis is caused by bacteria that enter the skin through cracks or cuts in the skin. The most common types of bacteria that cause cellulitis are staphylococci and streptococci. °SIGNS AND SYMPTOMS  °· Redness and warmth. °· Swelling. °· Tenderness or pain. °· Fever. °DIAGNOSIS  °Your health care provider can usually determine what is wrong based on a physical exam. Blood tests may also be done. °TREATMENT  °Treatment usually involves taking an antibiotic medicine. °HOME CARE INSTRUCTIONS  °·   Take your antibiotic medicine as directed by your health care provider. Finish the antibiotic even if you start to feel better. °· Keep the infected arm or leg elevated to reduce swelling. °· Apply a warm cloth to the affected area up to 4 times per day to relieve pain. °· Take medicines only as directed by your health care  provider. °· Keep all follow-up visits as directed by your health care provider. °SEEK MEDICAL CARE IF:  °· You notice red streaks coming from the infected area. °· Your red area gets larger or turns dark in color. °· Your bone or joint underneath the infected area becomes painful after the skin has healed. °· Your infection returns in the same area or another area. °· You notice a swollen bump in the infected area. °· You develop new symptoms. °· You have a fever. °SEEK IMMEDIATE MEDICAL CARE IF:  °· You feel very sleepy. °· You develop vomiting or diarrhea. °· You have a general ill feeling (malaise) with muscle aches and pains. °  °This information is not intended to replace advice given to you by your health care provider. Make sure you discuss any questions you have with your health care provider. °  °Document Released: 03/17/2005 Document Revised: 02/26/2015 Document Reviewed: 08/23/2011 °Elsevier Interactive Patient Education ©2016 [employer] ° °Incision and Drainage °Incision and drainage is a procedure in which a sac-like structure (cystic structure) is opened and drained. The area to be drained usually contains material such as pus, fluid, or blood.  °LET YOUR CAREGIVER KNOW ABOUT:  °· Allergies to medicine. °· Medicines taken, including vitamins, herbs, eyedrops, over-the-counter medicines, and creams. °· Use of steroids (by mouth or creams). °· Previous problems with anesthetics or numbing medicines. °· History of bleeding problems or blood clots. °· Previous surgery. °· Other health problems, including diabetes and kidney problems. °· Possibility of pregnancy, if this applies. °RISKS AND COMPLICATIONS °· Pain. °· Bleeding. °· Scarring. °· Infection. °BEFORE THE PROCEDURE  °You may need to have an ultrasound or other imaging tests to see how large or deep your cystic structure is. Blood tests may also be used to determine if you have an infection or how severe the infection is. You may need to have a  tetanus shot. °PROCEDURE  °The affected area is cleaned with a cleaning fluid. The cyst area will then be numbed with a medicine (local anesthetic). A small incision will be made in the cystic structure. A syringe or catheter may be used to drain the contents of the cystic structure, or the contents may be squeezed out. The area will then be flushed with a cleansing solution. After cleansing the area, it is often gently packed with a gauze or another wound dressing. Once it is packed, it will be covered with gauze and tape or some other type of wound dressing.  °AFTER THE PROCEDURE  °· Often, you will be allowed to go home right after the procedure. °· You may be given antibiotic medicine to prevent or heal an infection. °· If the area was packed with gauze or some other wound dressing, you will likely need to come back in 1 to 2 days to get it removed. °· The area should heal in about 14 days. °  °This information is not intended to replace advice given to you by your health care provider. Make sure you discuss any questions you have with your health care provider. °  °Document Released: 12/01/2000 Document Revised: 12/07/2011 Document Reviewed: 08/02/2011 °  Elsevier Interactive Patient Education ©2016 [employer] ° °
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
## 2015-08-25 ENCOUNTER — Ambulatory Visit (INDEPENDENT_AMBULATORY_CARE_PROVIDER_SITE_OTHER): Payer: Commercial Indemnity | Admitting: Family Medicine

## 2015-08-25 ENCOUNTER — Encounter: Payer: Self-pay | Admitting: Family Medicine

## 2015-08-25 VITALS — BP 127/77 | HR 68 | Temp 98.3°F | Resp 16 | Ht 65.0 in | Wt 168.0 lb

## 2015-08-25 DIAGNOSIS — L03031 Cellulitis of right toe: Secondary | ICD-10-CM | POA: Diagnosis not present

## 2015-08-25 NOTE — Patient Instructions (Addendum)
Continue Bactrim DS  And keep Podiatry  appt in 2 days.

## 2015-08-25 NOTE — Progress Notes (Signed)
Name: Brian Wolfe   MRN: [id number]    DOB: [birth year]   Date:08/25/2015       Progress Note  Subjective  Chief Complaint  Chief Complaint  Patient presents with  . Recurrent Skin Infections    ER for toe cellulitus 08/24/2015    HPI Here for f/u of ER visit for cellulitis of R great toe.  Cellulitis started 2 weeks ago.  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  Foot treated with Keflex for infection of tinea pedis on Jul 29, 2015.  Pain yesterday and went to ER.  Opened at ER last night.  Started Septra Ds and Tramadol this AM.  No problem-specific assessment & plan notes found for this encounter.   Past Medical History  Diagnosis Date  . Headache   . Depression   . GERD (gastroesophageal reflux disease)   . HTN (hypertension)   . Hypogonadism in male   . BPH with obstruction/lower urinary tract symptoms   . Cellulitis of leg   . Skin abscess   . Elevated PSA   . High grade prostatic intraepithelial neoplasia     Social History  Substance Use Topics  . Smoking status: Current Every Day Smoker -- 1.00 packs/day    Types: Cigarettes  . Smokeless tobacco: Not on file  . Alcohol Use: 0.0 oz/week    0 Standard drinks or equivalent per week     Comment: rarely     Current outpatient prescriptions:  .  ANDROGEL PUMP 20.25 MG/ACT (1.62%) GEL, Apply 2 Act topically every morning., Disp: 75 g, Rfl: 5 .  atorvastatin (LIPITOR) 20 MG tablet, Take 1 tablet (20 mg total) by mouth daily., Disp: 30 tablet, Rfl: 11 .  co-enzyme Q-10 30 MG capsule, Take 1 capsule (30 mg total) by mouth daily., Disp: , Rfl:  .  pantoprazole (PROTONIX) 40 MG tablet, Take 1 tablet (40 mg total) by mouth daily., Disp: 30 tablet, Rfl: 11 .  sertraline (ZOLOFT) 25 MG tablet, Take 1 tablet (25 mg total) by mouth daily., Disp: 30 tablet, Rfl: 11 .  sulfamethoxazole-trimethoprim (BACTRIM DS,SEPTRA DS) 800-160 MG tablet, Take 1 tablet by mouth 2 (two) times daily., Disp: 14 tablet, Rfl: 0 .   terbinafine (LAMISIL) 1 % cream, Apply 1 application topically 2 (two) times daily., Disp: 30 g, Rfl: 2 .  traMADol (ULTRAM) 50 MG tablet, Take 1 tablet (50 mg total) by mouth every 6 (six) hours as needed., Disp: 20 tablet, Rfl: 0  Not on File  Review of Systems  Constitutional: Negative for fever, chills, weight loss and malaise/fatigue.  HENT: Negative for hearing loss.   Eyes: Negative for blurred vision and double vision.  Respiratory: Negative for cough, shortness of breath and wheezing.   Cardiovascular: Negative for chest pain, palpitations and leg swelling.  Gastrointestinal: Negative for heartburn, abdominal pain and blood in stool.  Genitourinary: Negative for dysuria, urgency and frequency.  Skin: Negative for rash.       Cellulitis of R great toe  Neurological: Negative for tremors, weakness and headaches.      Objective  Filed Vitals:   08/25/15 1325  BP: 127/77  Pulse: 68  Temp: 98.3 F (36.8 C)  Resp: 16  Height: 5\' 5"  (1.651 m)  Weight: 168 lb (76.204 kg)     Physical Exam  Constitutional: He is well-developed, well-nourished, and in no distress. No distress.  Skin:  Drainage from abscess area and cellulitis at dorsal MT- T joint area.  Mod. Tender to palpation.  Patient reported that swelling of R foot has resolved somewhat.  Vitals reviewed.     Recent Results (from the past 2160 hour(s))  Lipid Profile     Status: Abnormal   Collection Time: 07/29/15  2:10 PM  Result Value Ref Range   Cholesterol, Total 165 100 - 199 mg/dL   Triglycerides 209 (H) 0 - 149 mg/dL   HDL 37 (L) >39 mg/dL   VLDL Cholesterol Cal 42 (H) 5 - 40 mg/dL   LDL Calculated 86 0 - 99 mg/dL   Chol/HDL Ratio 4.5 0.0 - 5.0 ratio units    Comment:                                   T. Chol/HDL Ratio                                             Men  Women                               1/2 Avg.Risk  3.4    3.3                                   Avg.Risk  5.0    4.4                                 2X Avg.Risk  9.6    7.1                                3X Avg.Risk 23.4   11.0      Assessment & Plan  1. Cellulitis of toe of right foot  - Wound culture -Soak foot tid.   -Continue Bactrim DS  -Keep Podiatry appt. In 2 days.

## 2015-08-27 ENCOUNTER — Ambulatory Visit (INDEPENDENT_AMBULATORY_CARE_PROVIDER_SITE_OTHER): Payer: Managed Care, Other (non HMO) | Admitting: Podiatry

## 2015-08-27 ENCOUNTER — Other Ambulatory Visit: Payer: Self-pay | Admitting: Family Medicine

## 2015-08-27 ENCOUNTER — Encounter: Payer: Self-pay | Admitting: Podiatry

## 2015-08-27 ENCOUNTER — Ambulatory Visit (INDEPENDENT_AMBULATORY_CARE_PROVIDER_SITE_OTHER): Payer: Managed Care, Other (non HMO)

## 2015-08-27 ENCOUNTER — Telehealth: Payer: Self-pay | Admitting: Family Medicine

## 2015-08-27 VITALS — BP 122/74 | HR 72 | Resp 16 | Ht 65.0 in | Wt 166.0 lb

## 2015-08-27 DIAGNOSIS — L02611 Cutaneous abscess of right foot: Secondary | ICD-10-CM | POA: Diagnosis not present

## 2015-08-27 DIAGNOSIS — L03031 Cellulitis of right toe: Secondary | ICD-10-CM | POA: Diagnosis not present

## 2015-08-27 DIAGNOSIS — L600 Ingrowing nail: Secondary | ICD-10-CM

## 2015-08-27 DIAGNOSIS — M79671 Pain in right foot: Secondary | ICD-10-CM

## 2015-08-27 MED ORDER — DOXYCYCLINE HYCLATE 100 MG PO TABS: 100.0000 mg | ORAL_TABLET | Freq: Two times a day (BID) | ORAL | Status: DC

## 2015-08-27 NOTE — Telephone Encounter (Signed)
Pt's wife advised as per Dr. Luan Pulling and Rx send for Doxycycline.

## 2015-08-27 NOTE — Telephone Encounter (Signed)
The antibiotic that pt has been taking for his infection is making him throw up and have headaches and dizziness.  The doctor at [facility] said Dr. Luan Pulling could change it to something else when he got the results of his culture.  The call back number is [phone]

## 2015-08-27 NOTE — Progress Notes (Signed)
   Subjective:    Patient ID: Brian Wolfe, male    DOB: Apr 19, 1961, 55 y.o.   MRN: [id number]  HPI Patient presents with foot pain in their right foot; great toe. Pt stated, "Saw Dr. Dixie Dials, [facility]; might have a staph or mercer infection in foot".  Patient also presents with a nail problem in their right foot; great toe; nail discoloration & thickened nail.   Review of Systems  Constitutional: Positive for appetite change and fatigue.  HENT: Positive for sore throat.   Respiratory: Positive for cough and chest tightness.   Gastrointestinal: Positive for nausea and vomiting.  Neurological: Positive for dizziness, light-headedness and headaches.  Psychiatric/Behavioral: Positive for confusion.  All other systems reviewed and are negative.      Objective:   Physical Exam        Assessment & Plan:

## 2015-08-27 NOTE — Telephone Encounter (Signed)
Have him stop the sulfa antibiotic  And start Doxycycline, 100 mg., 1 twice a day for 10 days (#20 / 0 refills).  He should take this with food also.  Maybe wait a day after stopping the first med before starting the second.

## 2015-08-28 LAB — WOUND CULTURE: Organism ID, Bacteria: NONE SEEN

## 2015-08-29 ENCOUNTER — Ambulatory Visit (INDEPENDENT_AMBULATORY_CARE_PROVIDER_SITE_OTHER): Payer: Commercial Indemnity | Admitting: Family Medicine

## 2015-08-29 ENCOUNTER — Ambulatory Visit
Admission: RE | Admit: 2015-08-29 | Discharge: 2015-08-29 | Disposition: A | Discharge status: Home / Self Care | Payer: Managed Care, Other (non HMO) | Source: Ambulatory Visit | Attending: Family Medicine | Admitting: Family Medicine

## 2015-08-29 VITALS — BP 122/78 | HR 85 | Temp 98.6°F | Resp 16 | Ht 65.0 in | Wt 168.0 lb

## 2015-08-29 DIAGNOSIS — L03031 Cellulitis of right toe: Secondary | ICD-10-CM

## 2015-08-29 DIAGNOSIS — R05 Cough: Secondary | ICD-10-CM | POA: Diagnosis not present

## 2015-08-29 DIAGNOSIS — R059 Cough, unspecified: Secondary | ICD-10-CM

## 2015-08-29 DIAGNOSIS — J40 Bronchitis, not specified as acute or chronic: Secondary | ICD-10-CM

## 2015-08-29 DIAGNOSIS — J358 Other chronic diseases of tonsils and adenoids: Secondary | ICD-10-CM

## 2015-08-29 DIAGNOSIS — R6883 Chills (without fever): Secondary | ICD-10-CM | POA: Diagnosis not present

## 2015-08-29 LAB — POCT INFLUENZA A/B
INFLUENZA B, POC: NEGATIVE
Influenza A, POC: NEGATIVE

## 2015-08-29 LAB — POCT RAPID STREP A (OFFICE): RAPID STREP A SCREEN: NEGATIVE

## 2015-08-29 IMAGING — CR DG CHEST 2V
1 series · 2 of 2 positions shown · non-contrast
Comparison: Two-view chest x-ray [DATE].

CLINICAL DATA: Exposure to flu.  Chills and cough.

EXAM:
CHEST - 2 VIEW

[Series 1: pa · 0.17mm/px · 2 of 2 slices shown]
[im 1/2]
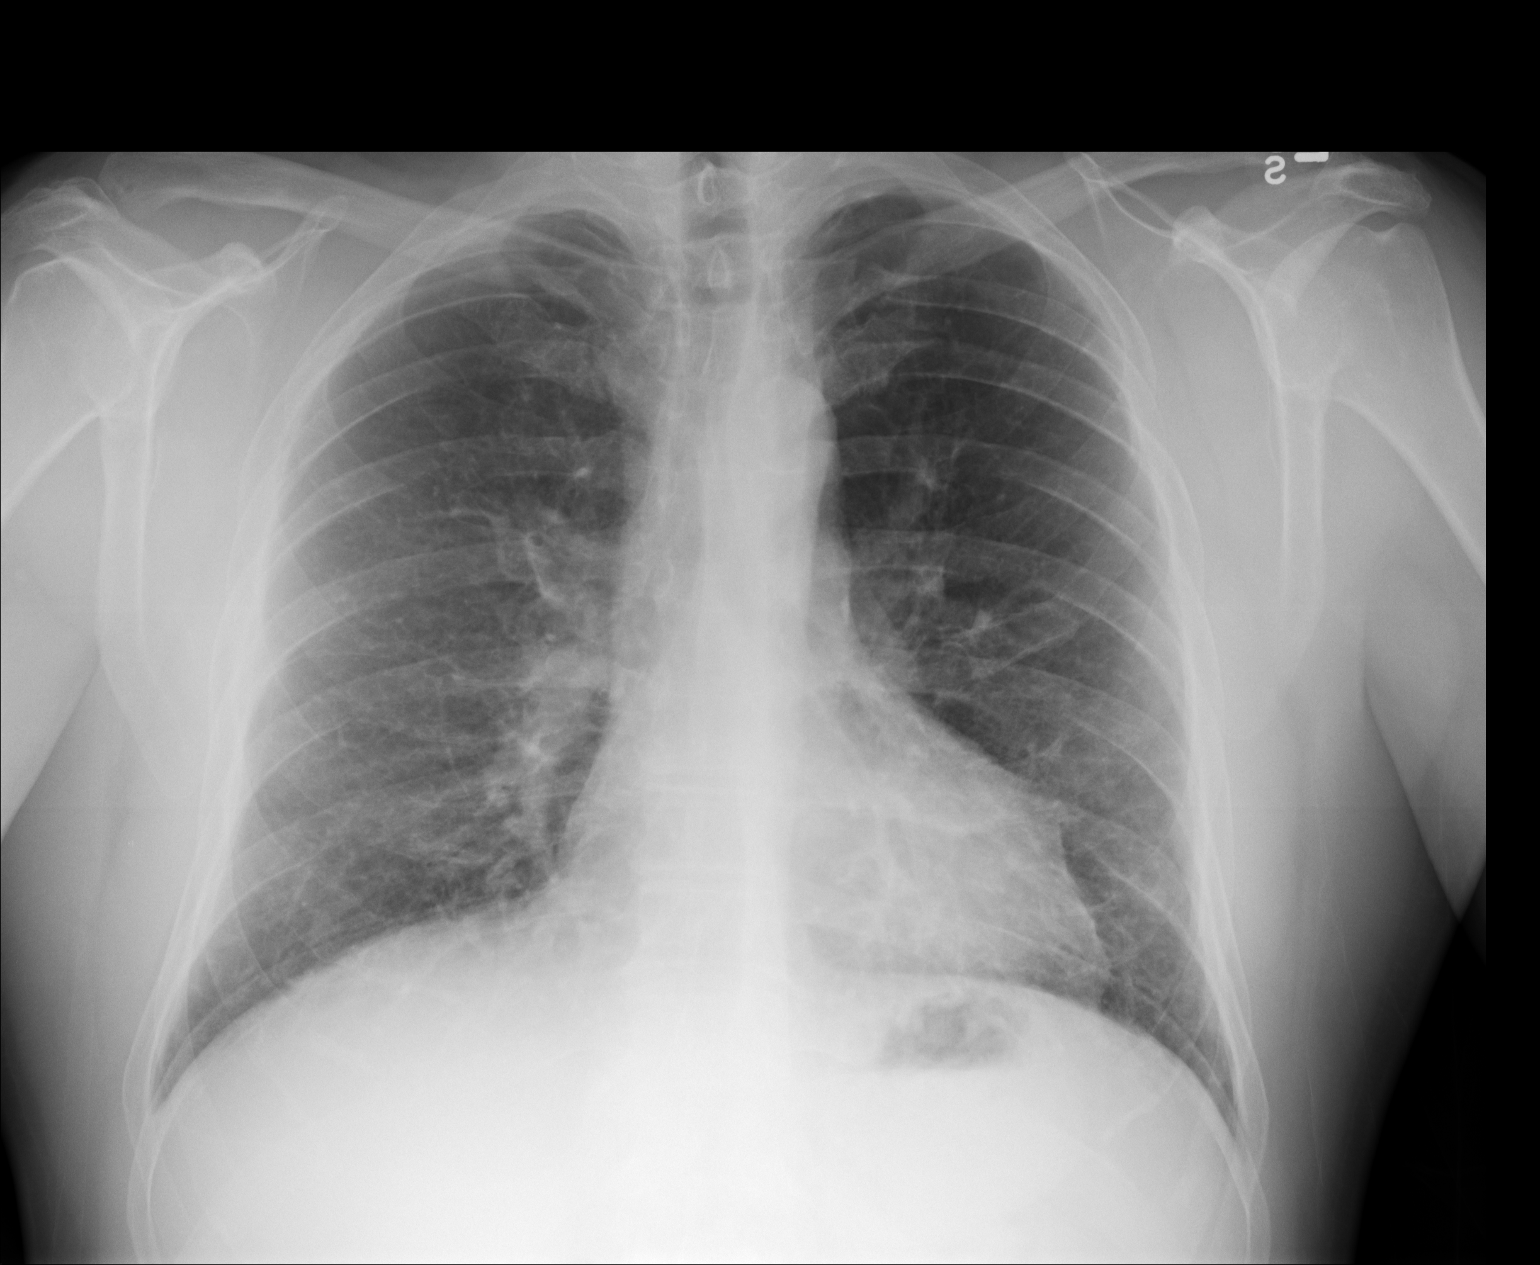
[im 2/2]
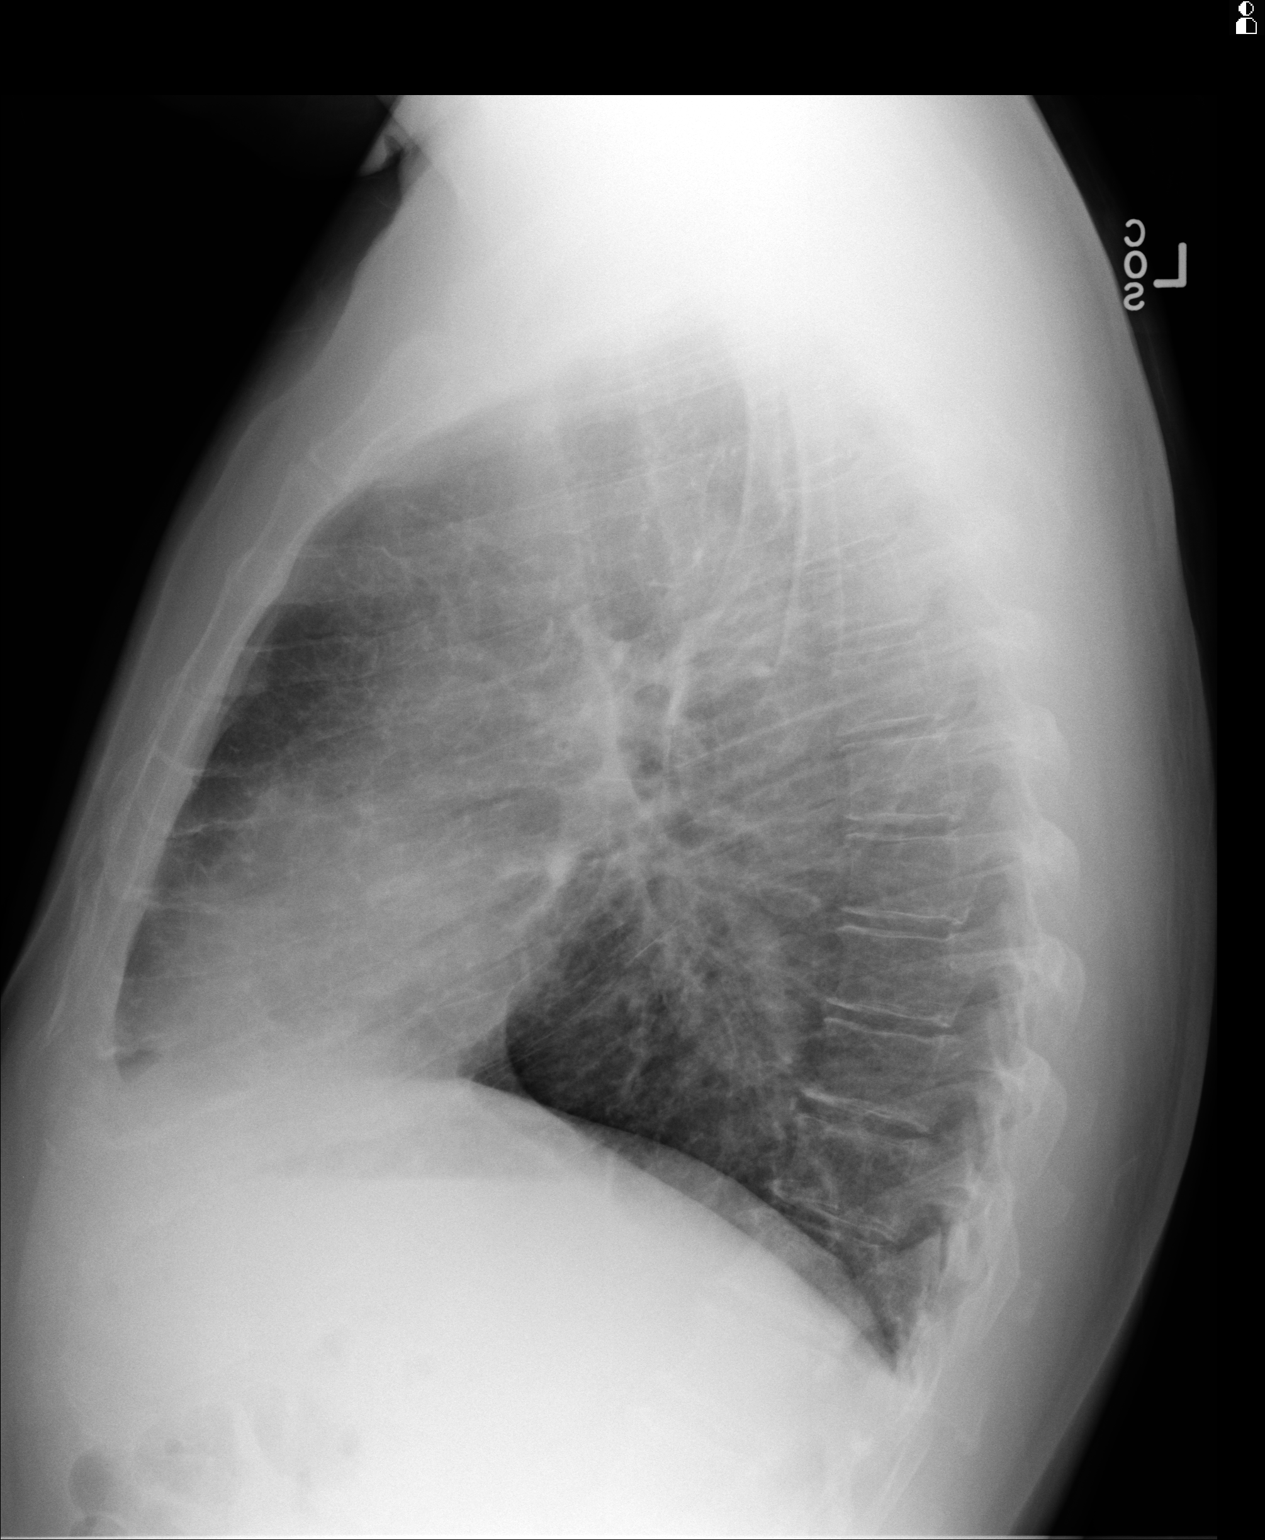

[2 of 2 positions shown; findings below may reference images not displayed]

FINDINGS: The heart size is normal. Mild chronic interstitial coarsening is
again seen. No focal airspace consolidation is present. The
visualized soft tissues and bony thorax are unremarkable.
IMPRESSION: No acute cardiopulmonary disease or significant interval change.

## 2015-08-29 MED ORDER — PREDNISONE 20 MG PO TABS: 40.0000 mg | ORAL_TABLET | Freq: Every day | ORAL | Status: DC

## 2015-08-29 MED ORDER — BENZONATATE 100 MG PO CAPS: 100.0000 mg | ORAL_CAPSULE | Freq: Three times a day (TID) | ORAL | Status: DC | PRN

## 2015-08-29 MED ORDER — ALBUTEROL SULFATE HFA 108 (90 BASE) MCG/ACT IN AERS: 2.0000 | INHALATION_SPRAY | Freq: Four times a day (QID) | RESPIRATORY_TRACT | Status: DC | PRN

## 2015-08-29 MED ORDER — DM-GUAIFENESIN ER 30-600 MG PO TB12: 1.0000 | ORAL_TABLET | Freq: Two times a day (BID) | ORAL | Status: DC

## 2015-08-29 NOTE — Patient Instructions (Addendum)
You can use supportive care at home to help with your symptoms. I have sent Mucinex DM to your pharmacy to help break up the congestion and soothe your cough. You can takes this twice daily.  I have also sent tesslon perles to your pharmacy to help with the cough- you can take these 3 times daily as needed. Honey is a natural cough suppressant- so add it to your tea in the morning.  If you have a humidifer, set that up in your bedroom at night.   Sore throat: Salt water gargles 4 times daily.   Please seek immediate medical attention if you develop shortness of breath not relieve by inhaler, chest pain/tightness, fever > 103 F or other concerning symptoms.

## 2015-08-29 NOTE — Progress Notes (Signed)
Subjective:    Patient ID: Brian Wolfe, male    DOB: [birth year], 55 y.o.   MRN: [id number]  HPI: Brian Wolfe is a 55 y.o. male presenting on 08/29/2015 for Cough   HPI  Pt presents cough and congestion, fever, bodyaches- Wednesday and Tuesday.  T-Max 101.8.  He was recently in ER for toe infection- thinks he may be have been exposed on Sunday night. Granddaughter is also sick. Symptoms started on Tuesday with sore throat, body aches, fatigue. Dry hacking cough started on Tuesday- cough is occasionally productive of clear sputum. No ear pain.  Sinus pain and congestion. Home treatment: Tussin DM, alkaselzter +- mild relief. Chest tightness and wheezing. Mild shortness a breath.   Toe is doing much better on doxycycline. No spreading redness.   Past Medical History  Diagnosis Date  . Headache   . Depression   . GERD (gastroesophageal reflux disease)   . HTN (hypertension)   . Hypogonadism in male   . BPH with obstruction/lower urinary tract symptoms   . Cellulitis of leg   . Skin abscess   . Elevated PSA   . High grade prostatic intraepithelial neoplasia     Current Outpatient Prescriptions on File Prior to Visit  Medication Sig  . ANDROGEL PUMP 20.25 MG/ACT (1.62%) GEL Apply 2 Act topically every morning.  Marland Kitchen atorvastatin (LIPITOR) 20 MG tablet Take 1 tablet (20 mg total) by mouth daily.  Marland Kitchen co-enzyme Q-10 30 MG capsule Take 1 capsule (30 mg total) by mouth daily.  Marland Kitchen doxycycline (VIBRA-TABS) 100 MG tablet Take 1 tablet (100 mg total) by mouth 2 (two) times daily.  . pantoprazole (PROTONIX) 40 MG tablet Take 1 tablet (40 mg total) by mouth daily.  . sertraline (ZOLOFT) 25 MG tablet Take 1 tablet (25 mg total) by mouth daily.  Marland Kitchen terbinafine (LAMISIL) 1 % cream Apply 1 application topically 2 (two) times daily.   No current facility-administered medications on file prior to visit.    Review of Systems  Constitutional: Positive for fever and chills.  HENT: Positive for  congestion and sore throat. Negative for ear discharge, ear pain, sinus pressure and trouble swallowing.   Respiratory: Positive for cough, chest tightness and shortness of breath. Negative for choking.   Cardiovascular: Negative for chest pain and palpitations.  Gastrointestinal: Negative for nausea, vomiting, abdominal pain and diarrhea.  Musculoskeletal: Positive for myalgias. Negative for neck pain and neck stiffness.  Skin: Negative for rash.  Neurological: Negative for dizziness, syncope, weakness and headaches.   Per HPI unless specifically indicated above     Objective:    BP 122/78 mmHg  Pulse 85  Temp(Src) 98.6 F (37 C) (Oral)  Resp 16  Ht 5\' 5"  (1.651 m)  Wt 168 lb (76.204 kg)  BMI 27.96 kg/m2  SpO2 95%  Wt Readings from Last 3 Encounters:  08/29/15 168 lb (76.204 kg)  08/27/15 166 lb (75.297 kg)  08/25/15 168 lb (76.204 kg)    Physical Exam  Constitutional: He is oriented to person, place, and time. He appears well-developed and well-nourished. No distress.  HENT:  Head: Normocephalic and atraumatic.  Right Ear: Hearing and tympanic membrane normal.  Left Ear: Hearing and tympanic membrane normal.  Nose: Mucosal edema and rhinorrhea present. Right sinus exhibits no maxillary sinus tenderness and no frontal sinus tenderness. Left sinus exhibits no maxillary sinus tenderness and no frontal sinus tenderness.  Mouth/Throat: Mucous membranes are normal. Oropharyngeal exudate and posterior oropharyngeal erythema present. No tonsillar  abscesses.  Neck: Neck supple. No thyromegaly present.  Cardiovascular: Normal rate, regular rhythm and normal heart sounds.  Exam reveals no gallop and no friction rub.   No murmur heard. Pulmonary/Chest: Effort normal. No respiratory distress. He has no decreased breath sounds. He has wheezes (expiratory) in the right middle field, the right lower field, the left middle field and the left lower field. He has no rhonchi. He has no rales.  Chest wall is not dull to percussion. He exhibits no tenderness.  Abdominal: Soft. Bowel sounds are normal. He exhibits no distension. There is no tenderness. There is no rebound.  Musculoskeletal: Normal range of motion. He exhibits no edema or tenderness.  Lymphadenopathy:    He has cervical adenopathy.       Right cervical: Superficial cervical adenopathy present.       Left cervical: Superficial cervical adenopathy present.  Neurological: He is alert and oriented to person, place, and time. He has normal reflexes.  Skin: Skin is warm and dry. No rash noted. There is erythema.  Erythema surrounding R great toe. Non-tender. Erythema is improved based on toe markings.   Psychiatric: He has a normal mood and affect. His behavior is normal. Thought content normal.   Results for orders placed or performed in visit on 08/29/15  POCT Influenza A/B  Result Value Ref Range   Influenza A, POC Negative Negative   Influenza B, POC Negative Negative  POCT rapid strep A  Result Value Ref Range   Rapid Strep A Screen Negative Negative      Assessment & Plan:   Problem List Items Addressed This Visit    None    Visit Diagnoses    Chills    -  Primary    Likely 2/2 fever from viral illness or celluitis of foot.     Relevant Orders    POCT Influenza A/B (Completed)    DG Chest 2 View (Completed)    Bronchitis        CXR WNL. Wheezing likely 2/2 bronchitis. Inhaler and prednisone to help with breathing. Alarm symptoms reviewed. Return if not improving.     Relevant Medications    albuterol (PROVENTIL HFA;VENTOLIN HFA) 108 (90 Base) MCG/ACT inhaler    dextromethorphan-guaiFENesin (MUCINEX DM) 30-600 MG 12hr tablet    benzonatate (TESSALON) 100 MG capsule    predniSONE (DELTASONE) 20 MG tablet    Other Relevant Orders    DG Chest 2 View (Completed)    Tonsillar exudate        Rapid strep is negative. Likely 2/2 virus. Supportive care at home. Alarm symptoms reviewed.     Relevant Orders     POCT rapid strep A (Completed)    Culture, Group A Strep    Cellulitis of toe of right foot        Improving on doxycycline. Alarm symptoms reviewed.        Meds ordered this encounter  Medications  . albuterol (PROVENTIL HFA;VENTOLIN HFA) 108 (90 Base) MCG/ACT inhaler    Sig: Inhale 2 puffs into the lungs every 6 (six) hours as needed for wheezing or shortness of breath.    Dispense:  1 Inhaler    Refill:  0    Order Specific Question:  Supervising Provider    Answer:  Arlis Porta [id number]  . dextromethorphan-guaiFENesin (MUCINEX DM) 30-600 MG 12hr tablet    Sig: Take 1 tablet by mouth 2 (two) times daily.    Dispense:  20 tablet  Refill:  0    Order Specific Question:  Supervising Provider    Answer:  Arlis Porta [id number]  . benzonatate (TESSALON) 100 MG capsule    Sig: Take 1 capsule (100 mg total) by mouth 3 (three) times daily as needed.    Dispense:  30 capsule    Refill:  0    Order Specific Question:  Supervising Provider    Answer:  Arlis Porta [id number]  . predniSONE (DELTASONE) 20 MG tablet    Sig: Take 2 tablets (40 mg total) by mouth daily with breakfast.    Dispense:  10 tablet    Refill:  0    Order Specific Question:  Supervising Provider    Answer:  Arlis Porta [id number]      Follow up plan: No Follow-up on file.

## 2015-08-30 ENCOUNTER — Encounter: Payer: Self-pay | Admitting: Family Medicine

## 2015-08-31 ENCOUNTER — Emergency Department: Payer: Managed Care, Other (non HMO)

## 2015-08-31 ENCOUNTER — Emergency Department
Admission: EM | Admit: 2015-08-31 | Discharge: 2015-08-31 | Disposition: A | Discharge status: Home / Self Care | Payer: Managed Care, Other (non HMO) | Attending: Emergency Medicine | Admitting: Emergency Medicine

## 2015-08-31 ENCOUNTER — Encounter: Payer: Self-pay | Admitting: Emergency Medicine

## 2015-08-31 DIAGNOSIS — I10 Essential (primary) hypertension: Secondary | ICD-10-CM | POA: Diagnosis not present

## 2015-08-31 DIAGNOSIS — F17210 Nicotine dependence, cigarettes, uncomplicated: Secondary | ICD-10-CM | POA: Diagnosis not present

## 2015-08-31 DIAGNOSIS — Z792 Long term (current) use of antibiotics: Secondary | ICD-10-CM | POA: Diagnosis not present

## 2015-08-31 DIAGNOSIS — Z79899 Other long term (current) drug therapy: Secondary | ICD-10-CM | POA: Insufficient documentation

## 2015-08-31 DIAGNOSIS — R05 Cough: Secondary | ICD-10-CM | POA: Diagnosis present

## 2015-08-31 DIAGNOSIS — J209 Acute bronchitis, unspecified: Secondary | ICD-10-CM

## 2015-08-31 DIAGNOSIS — J9801 Acute bronchospasm: Secondary | ICD-10-CM | POA: Diagnosis not present

## 2015-08-31 DIAGNOSIS — Z7952 Long term (current) use of systemic steroids: Secondary | ICD-10-CM | POA: Insufficient documentation

## 2015-08-31 LAB — CULTURE, GROUP A STREP: STREP A CULTURE: NEGATIVE

## 2015-08-31 IMAGING — CR DG CHEST 2V
1 series · 2 of 2 positions shown · non-contrast
Comparison: [DATE]

CLINICAL DATA: Cough, congestion, shortness of Breath for 4 days.

EXAM:
CHEST  2 VIEW

[Series 1: dg chest 2 view · 0.14mm/px · 2 of 2 slices shown]
[im 1/2]
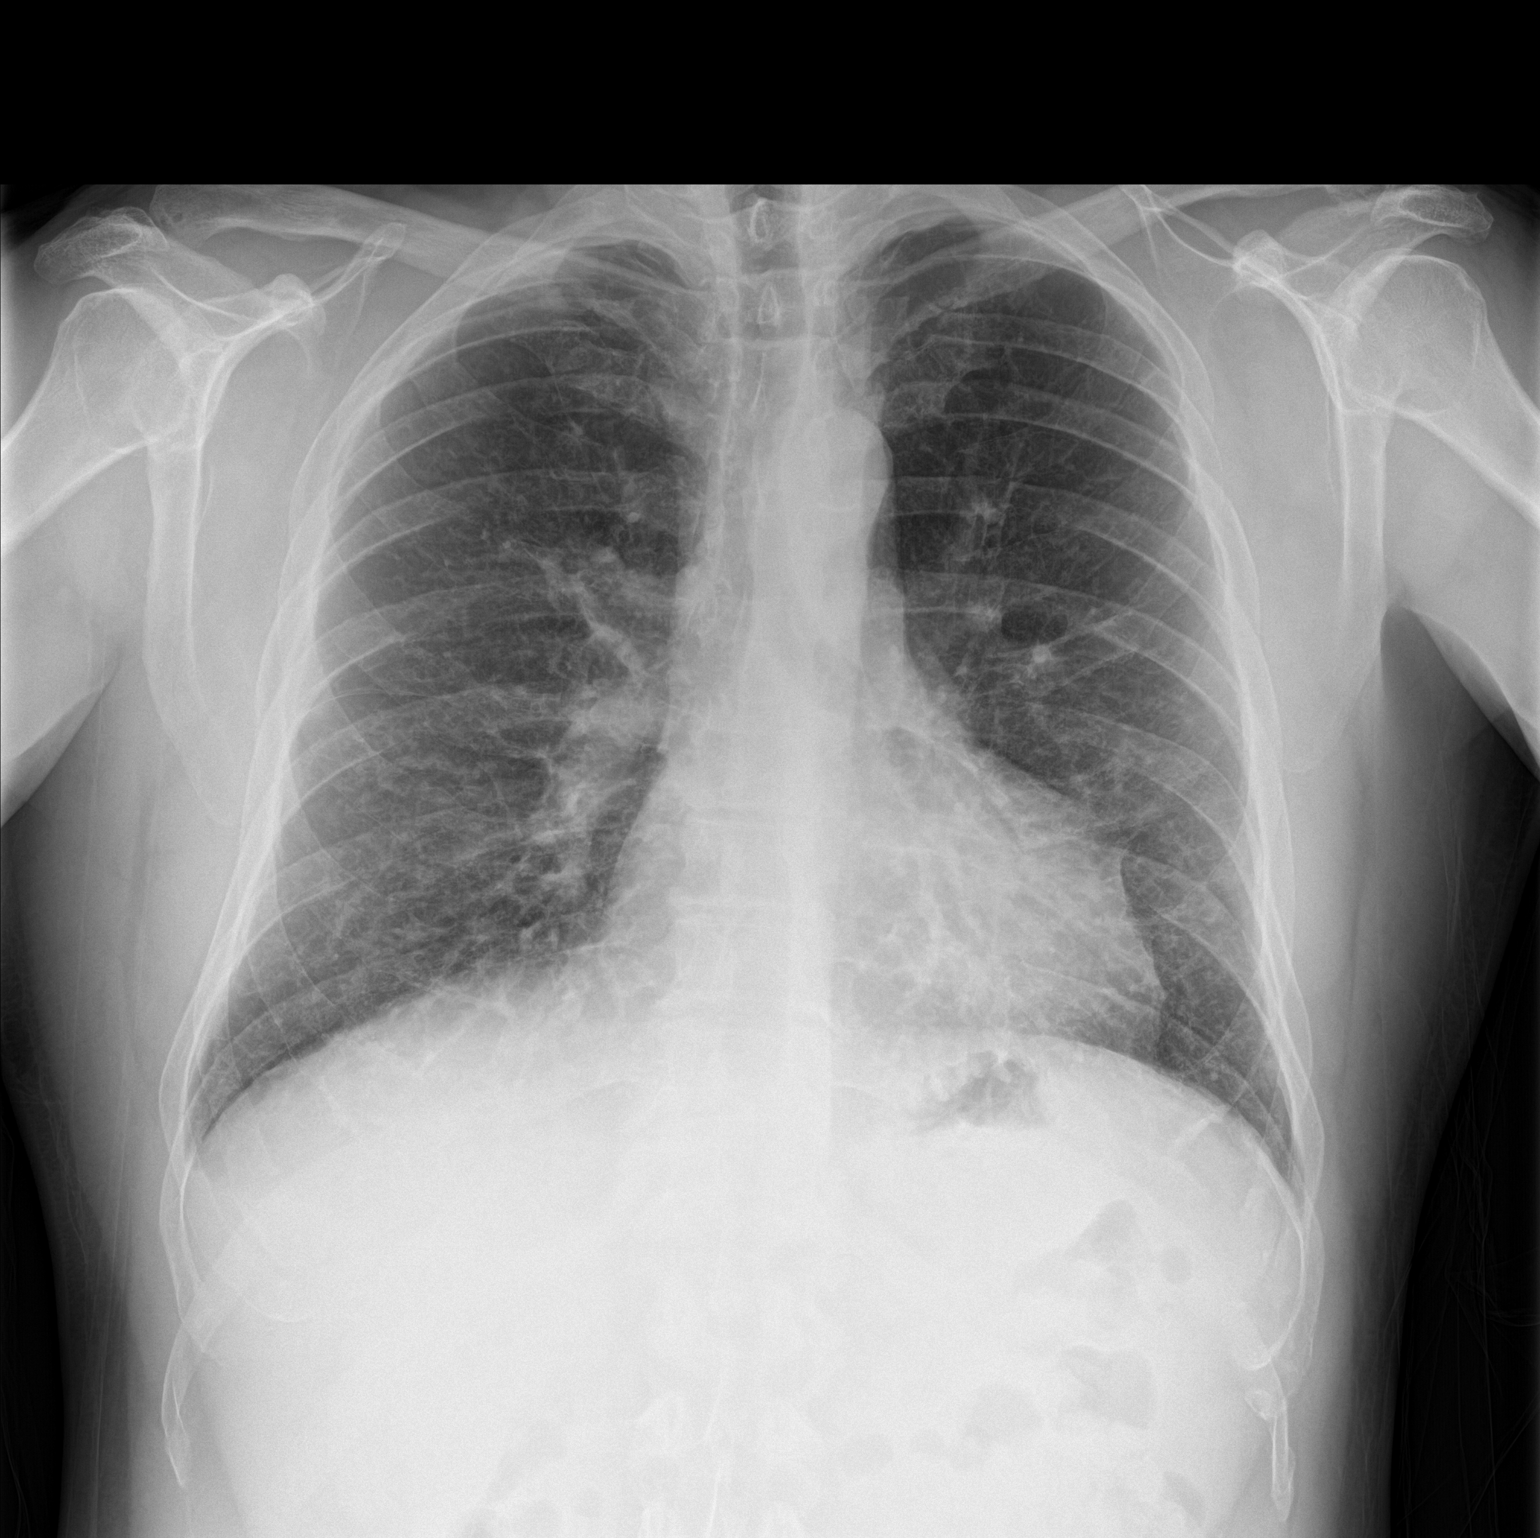
[im 2/2]
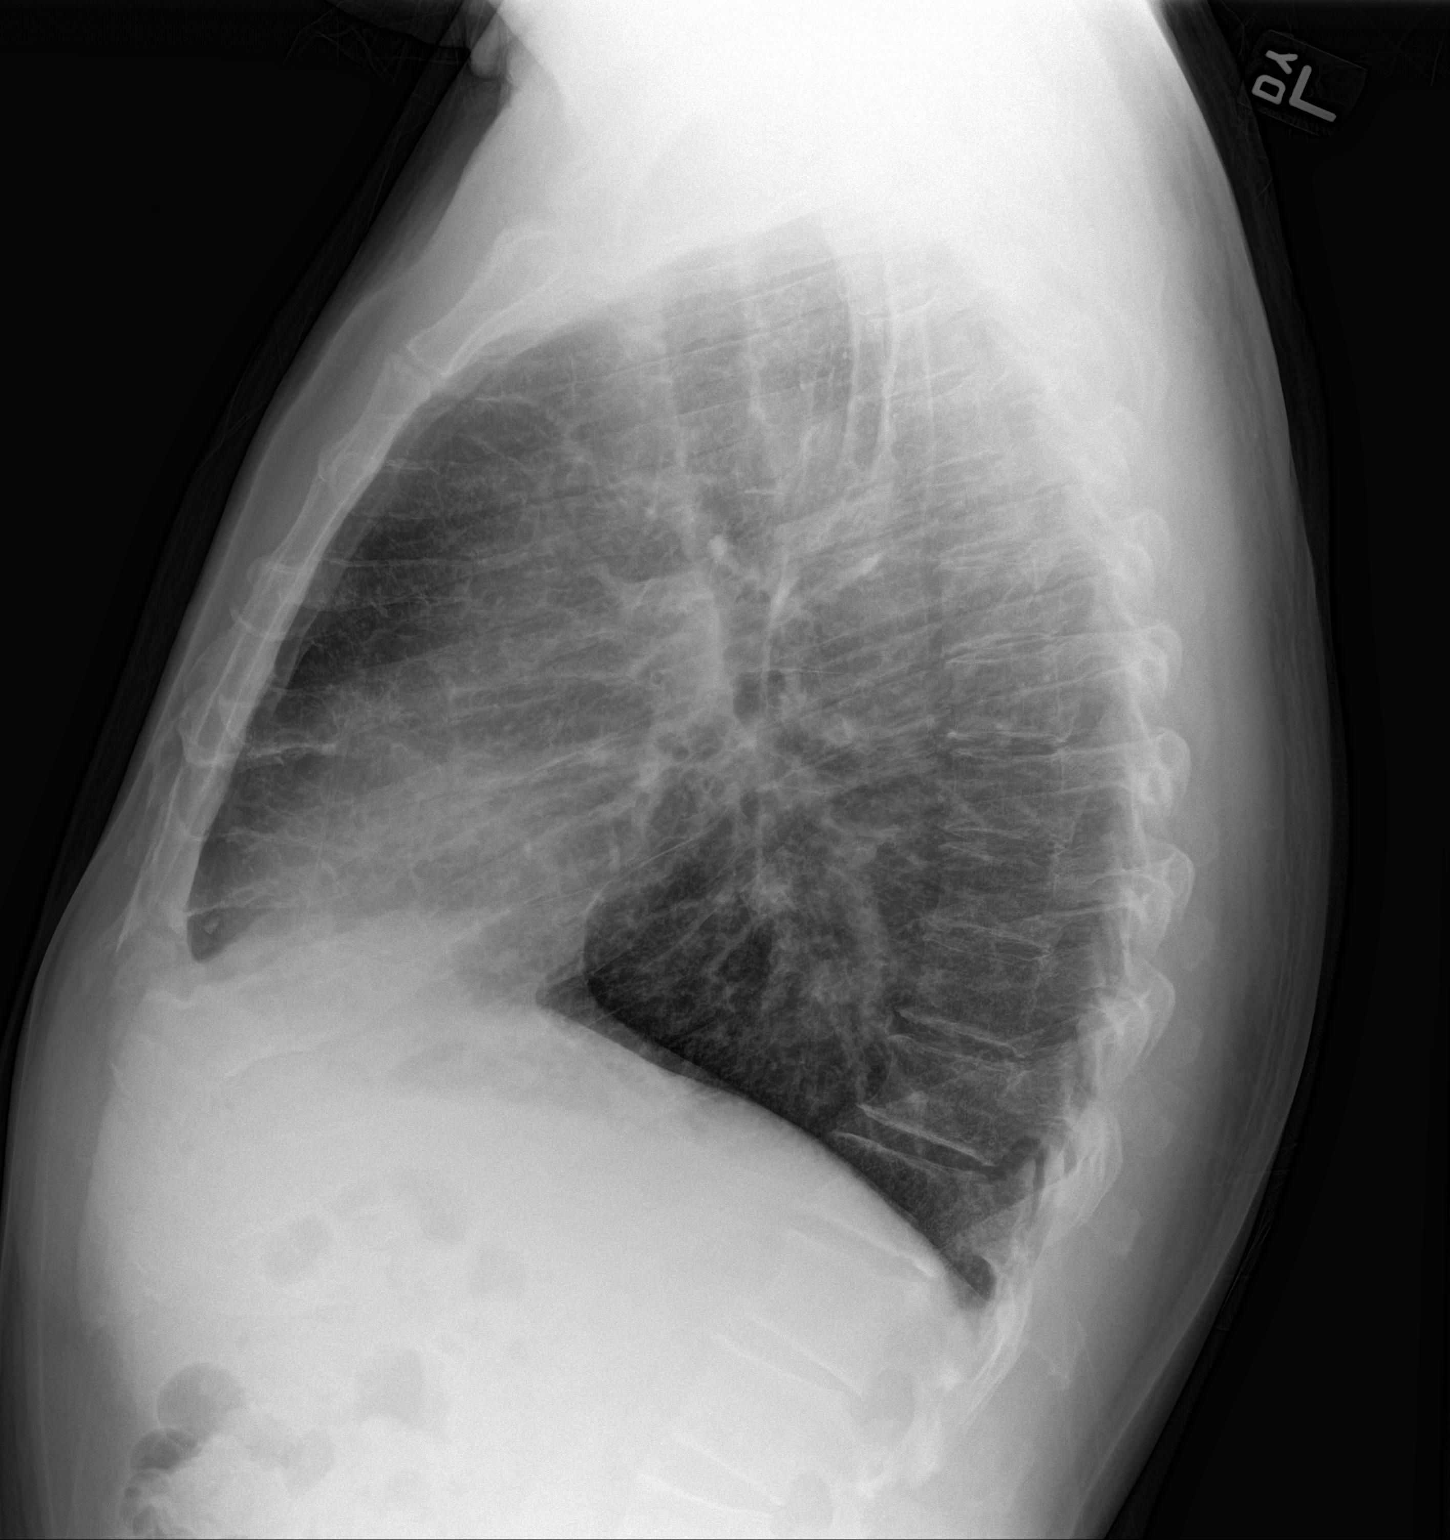

[2 of 2 positions shown; findings below may reference images not displayed]

FINDINGS: Diffuse interstitial prominence throughout the lungs, most
pronounced in the lung bases, likely chronic interstitial lung
disease. Heart is normal size. No effusions. No acute bony
abnormality.
IMPRESSION: No acute findings.  Chronic interstitial lung disease.

## 2015-08-31 MED ORDER — ACETAMINOPHEN-CODEINE #3 300-30 MG PO TABS
1.0000 | ORAL_TABLET | Freq: Once | ORAL | Status: AC
  Administered 2015-08-31: 1 via ORAL
  Filled 2015-08-31: qty 1

## 2015-08-31 MED ORDER — IPRATROPIUM-ALBUTEROL 0.5-2.5 (3) MG/3ML IN SOLN
3.0000 mL | Freq: Once | RESPIRATORY_TRACT | Status: AC
  Administered 2015-08-31: 3 mL via RESPIRATORY_TRACT
  Filled 2015-08-31: qty 3

## 2015-08-31 MED ORDER — ACETAMINOPHEN-CODEINE #3 300-30 MG PO TABS
1.0000 | ORAL_TABLET | Freq: Four times a day (QID) | ORAL | Status: DC | PRN
Start: 2015-08-31 — End: 2016-02-12

## 2015-08-31 NOTE — ED Notes (Signed)
Pt was started on bactrim last Wednesday for toe infection but was unable to tolerate it (n/v) and his PCP changed him to doxycycline on Thursday 08/28/15. Pt reports he has been taking doxycycline, mucinex, and tessalon pearls at home with no relief.
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
## 2015-08-31 NOTE — ED Notes (Signed)
Discussed discharge instructions, prescriptions, and follow-up care with patient. No questions or concerns at this time. Pt stable at discharge.  

## 2015-08-31 NOTE — Discharge Instructions (Signed)
Acute Bronchitis Bronchitis is inflammation of the airways that extend from the windpipe into the lungs (bronchi). The inflammation often causes mucus to develop. This leads to a cough, which is the most common symptom of bronchitis.  In acute bronchitis, the condition usually develops suddenly and goes away over time, usually in a couple weeks. Smoking, allergies, and asthma can make bronchitis worse. Repeated episodes of bronchitis may cause further lung problems.  CAUSES Acute bronchitis is most often caused by the same virus that causes a cold. The virus can spread from person to person (contagious) through coughing, sneezing, and touching contaminated objects. SIGNS AND SYMPTOMS   Cough.   Fever.   Coughing up mucus.   Body aches.   Chest congestion.   Chills.   Shortness of breath.   Sore throat.  DIAGNOSIS  Acute bronchitis is usually diagnosed through a physical exam. Your health care provider will also ask you questions about your medical history. Tests, such as chest X-rays, are sometimes done to rule out other conditions.  TREATMENT  Acute bronchitis usually goes away in a couple weeks. Oftentimes, no medical treatment is necessary. Medicines are sometimes given for relief of fever or cough. Antibiotic medicines are usually not needed but may be prescribed in certain situations. In some cases, an inhaler may be recommended to help reduce shortness of breath and control the cough. A cool mist vaporizer may also be used to help thin bronchial secretions and make it easier to clear the chest.  HOME CARE INSTRUCTIONS  Get plenty of rest.   Drink enough fluids to keep your urine clear or pale yellow (unless you have a medical condition that requires fluid restriction). Increasing fluids may help thin your respiratory secretions (sputum) and reduce chest congestion, and it will prevent dehydration.   Take medicines only as directed by your health care provider.  If  you were prescribed an antibiotic medicine, finish it all even if you start to feel better.  Avoid smoking and secondhand smoke. Exposure to cigarette smoke or irritating chemicals will make bronchitis worse. If you are a smoker, consider using nicotine gum or skin patches to help control withdrawal symptoms. Quitting smoking will help your lungs heal faster.   Reduce the chances of another bout of acute bronchitis by washing your hands frequently, avoiding people with cold symptoms, and trying not to touch your hands to your mouth, nose, or eyes.   Keep all follow-up visits as directed by your health care provider.  SEEK MEDICAL CARE IF: Your symptoms do not improve after 1 week of treatment.  SEEK IMMEDIATE MEDICAL CARE IF:  You develop an increased fever or chills.   You have chest pain.   You have severe shortness of breath.  You have bloody sputum.   You develop dehydration.  You faint or repeatedly feel like you are going to pass out.  You develop repeated vomiting.  You develop a severe headache. MAKE SURE YOU:   Understand these instructions.  Will watch your condition.  Will get help right away if you are not doing well or get worse.   This information is not intended to replace advice given to you by your health care provider. Make sure you discuss any questions you have with your health care provider.   Document Released: 07/15/2004 Document Revised: 06/28/2014 Document Reviewed: 11/28/2012 Elsevier Interactive Patient Education 2016 [place] chest x-ray is normal today. You should continue to dose the antibiotic as previously prescribed. In  addition, continue to dose over-the-counter cough and cold medicines as directed. Take the Tylenol with Codeine as needed for cough control and continue to increase fluid intake. Follow your provider for ongoing symptom management.

## 2015-08-31 NOTE — ED Notes (Signed)
Reports cough and sob, dx by his MD with bronchitis last week.  Taking doxycycline but not better.  No resp distress, skin w/d with good color.  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

## 2015-09-01 ENCOUNTER — Telehealth: Payer: Self-pay | Admitting: Family Medicine

## 2015-09-01 NOTE — Telephone Encounter (Signed)
Called pt and LMTCB with the wife.  The ER gave him tylenol #3 and I am not sure what he means by doubled up on cough perles.  If he means the 200mg  tessalon perles, I can give that to him. If he is missing the controlled medication, I would like to do more investigating as to why the ER didn't give him a prescription.

## 2015-09-01 NOTE — Telephone Encounter (Signed)
The doctor at the ER doubled up on the cough pearls but failed to write prescription.  Can Brian Wolfe write him another prescription for 30 pills?  Please call [phone]

## 2015-09-01 NOTE — Telephone Encounter (Signed)
Pt has Rx for Tylenol #3 but they didn't gave a Rx for tessalon pearl ?

## 2015-09-02 ENCOUNTER — Ambulatory Visit: Payer: Self-pay | Admitting: Podiatry

## 2015-09-02 NOTE — Telephone Encounter (Signed)
Called into pharmacy on file.

## 2015-09-12 ENCOUNTER — Ambulatory Visit: Payer: Self-pay | Admitting: Sports Medicine

## 2015-09-12 ENCOUNTER — Telehealth: Payer: Self-pay | Admitting: *Deleted

## 2015-09-12 NOTE — Telephone Encounter (Signed)
Left message for patient at [phone] (Home #) to check to see how they were doing from their ingrown toenail procedure that was performed on Wednesday, August 27, 2015. Waiting for a response.

## 2015-09-17 NOTE — Progress Notes (Signed)
Subjective:     Patient ID: Brian Wolfe, male   DOB: March 15, 1961, 55 y.o.   MRN: [id number]  HPI patient presents with irritation in the right big toe and is concerned that there may be a staph or MRSA infection as part of this. Also has discoloration of the right great toenail   Review of Systems  All other systems reviewed and are negative.      Objective:   Physical Exam  Constitutional: He is oriented to person, place, and time.  Cardiovascular: Intact distal pulses.   Musculoskeletal: Normal range of motion.  Neurological: He is oriented to person, place, and time.  Skin: Skin is warm.  Nursing note and vitals reviewed.  neurovascular status found to be intact muscle strength was adequate with localized redness of the right big toe with no indications of acute infection or drainage at the current time. Patient states the pain seems to be improving but they just wanted to get it checked again and they are also working with her family physician in [place] for this particular condition     Assessment:     Appears to be some toe localized infection process right that's or to being treated currently with antibiotic treatment which I do believe most likely is appropriate for condition. Do not note any proximal edema erythema or drainage at the current time    Plan:     H&P condition reviewed with patient. At this point we will continue him on his antibiotics of doxycycline soaks and padding. If any worsening of symptoms were to occur or any proximal edema erythema or drainage were to occur he is to immediately go emergency room. He is going to continue to work with his physician as she is more local for him and he's here for him to see. I am available if there should be any problems that should occur

## 2015-10-10 ENCOUNTER — Ambulatory Visit (INDEPENDENT_AMBULATORY_CARE_PROVIDER_SITE_OTHER): Payer: Managed Care, Other (non HMO) | Admitting: Urology

## 2015-10-10 ENCOUNTER — Encounter: Payer: Self-pay | Admitting: Urology

## 2015-10-10 VITALS — BP 130/82 | HR 70 | Ht 65.0 in | Wt 167.2 lb

## 2015-10-10 DIAGNOSIS — N4231 Prostatic intraepithelial neoplasia: Secondary | ICD-10-CM

## 2015-10-10 DIAGNOSIS — N138 Other obstructive and reflux uropathy: Secondary | ICD-10-CM

## 2015-10-10 DIAGNOSIS — N401 Enlarged prostate with lower urinary tract symptoms: Secondary | ICD-10-CM | POA: Diagnosis not present

## 2015-10-10 DIAGNOSIS — N529 Male erectile dysfunction, unspecified: Secondary | ICD-10-CM

## 2015-10-10 DIAGNOSIS — N528 Other male erectile dysfunction: Secondary | ICD-10-CM | POA: Diagnosis not present

## 2015-10-10 DIAGNOSIS — E291 Testicular hypofunction: Secondary | ICD-10-CM

## 2015-10-10 MED ORDER — TADALAFIL 20 MG PO TABS: 20.0000 mg | ORAL_TABLET | Freq: Every day | ORAL | Status: DC | PRN

## 2015-10-10 MED ORDER — ANDROGEL PUMP 20.25 MG/ACT (1.62%) TD GEL: 2.0000 | Freq: Every morning | TRANSDERMAL | Status: DC
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
## 2015-10-11 LAB — HEMATOCRIT: HEMATOCRIT: 45.5 % (ref 37.5–51.0)

## 2015-10-11 LAB — TESTOSTERONE: Testosterone: 299 ng/dL — ABNORMAL LOW (ref 348–1197)

## 2015-10-11 LAB — ESTRADIOL: ESTRADIOL: 12.3 pg/mL (ref 7.6–42.6)

## 2015-10-11 LAB — TSH: TSH: 0.592 u[IU]/mL (ref 0.450–4.500)

## 2015-10-11 LAB — PSA: PROSTATE SPECIFIC AG, SERUM: 2.9 ng/mL (ref 0.0–4.0)

## 2015-10-13 DIAGNOSIS — N529 Male erectile dysfunction, unspecified: Secondary | ICD-10-CM | POA: Insufficient documentation

## 2015-10-14 ENCOUNTER — Telehealth: Payer: Self-pay

## 2015-10-14 NOTE — Telephone Encounter (Signed)
-----   Message from Nori Riis, PA-C sent at 10/12/2015  6:51 PM EDT ----- Patient's testosterone is not at therapeutic levels.  It may be beneficial for him to change to the Testopel as it bypasses skin.  He may get better levels.

## 2015-10-14 NOTE — Telephone Encounter (Signed)
LMOM

## 2015-10-16 NOTE — Telephone Encounter (Signed)
No answer

## 2015-10-20 NOTE — Telephone Encounter (Signed)
Pt's wife called stating she filled out paperwork for Testopel last week but the pt has to sign it & he won't be available until after 10/30/15. She is looking for her POA paperwork so she may be able to sign it prior to that if she finds the paperwork.

## 2015-10-20 NOTE — Telephone Encounter (Signed)
LMOM- Will send a letter.

## 2015-11-04 ENCOUNTER — Encounter: Payer: Self-pay | Admitting: Family Medicine

## 2015-11-09 ENCOUNTER — Encounter: Payer: Self-pay | Admitting: Urology

## 2015-11-18 ENCOUNTER — Telehealth: Payer: Self-pay | Admitting: *Deleted

## 2015-11-18 NOTE — Telephone Encounter (Signed)
LMOM for patient to call back and we can schedule an appointment for his Testopel.

## 2015-11-19 NOTE — Telephone Encounter (Signed)
Spoke with pt in reference to testopel appt. Pt stated that his wife is going to call back to make appt.

## 2015-12-01 ENCOUNTER — Ambulatory Visit (INDEPENDENT_AMBULATORY_CARE_PROVIDER_SITE_OTHER): Payer: Managed Care, Other (non HMO) | Admitting: Urology

## 2015-12-01 VITALS — BP 147/86 | HR 56 | Ht 65.0 in | Wt 168.0 lb

## 2015-12-01 DIAGNOSIS — E291 Testicular hypofunction: Secondary | ICD-10-CM | POA: Diagnosis not present

## 2015-12-01 MED ORDER — TESTOSTERONE 75 MG IL PLLT
75.0000 mg | PELLET | Freq: Once | Status: AC
  Administered 2015-12-01: 75 mg

## 2015-12-01 NOTE — Progress Notes (Signed)
This is a 55 -year-old male with hypogonadism and he is managed with Testopel. He presents today for Testopel insertion.  Patient is placed on the exam table in the left lateral jackknife position.  Identified upper outer quadrant of hip for insertion; prepped area with Betadine and injected 10 cc's of Lidocaine 1% with Epinephrine to anesthetize superficially and distally along trocar tract.  Made 3 mm incision using 15 blade of scalpel; trocar with sharp ended stylet was inserted into subcutaneous tissue in line with femur. Sharp stylet was withdrawn and 6 pellets were placed into trocar well. Testopel pellets advanced into tissue using blunt ended stylet. Trocar removed and incision closed using 6 Steri-Strips. Cleansed area to remove Betadine and covered Steri-Strips with outer Band-Aid.  Careful inspection of insertion is done and patient informed of post procedure instructions.  He will return in one month for serum testosterone.

## 2016-01-05 ENCOUNTER — Other Ambulatory Visit: Payer: Self-pay

## 2016-01-05 DIAGNOSIS — E291 Testicular hypofunction: Secondary | ICD-10-CM

## 2016-01-06 ENCOUNTER — Telehealth: Payer: Self-pay | Admitting: Family Medicine

## 2016-01-06 ENCOUNTER — Other Ambulatory Visit: Payer: Managed Care, Other (non HMO)

## 2016-01-06 ENCOUNTER — Encounter: Payer: Self-pay | Admitting: Family Medicine

## 2016-01-06 ENCOUNTER — Ambulatory Visit (INDEPENDENT_AMBULATORY_CARE_PROVIDER_SITE_OTHER): Payer: Managed Care, Other (non HMO) | Admitting: Family Medicine

## 2016-01-06 ENCOUNTER — Ambulatory Visit
Admission: RE | Admit: 2016-01-06 | Discharge: 2016-01-06 | Disposition: A | Discharge status: Home / Self Care | Payer: Managed Care, Other (non HMO) | Source: Ambulatory Visit | Attending: Family Medicine | Admitting: Family Medicine

## 2016-01-06 VITALS — BP 118/75 | HR 65 | Temp 98.0°F | Resp 16 | Ht 65.0 in | Wt 168.0 lb

## 2016-01-06 DIAGNOSIS — L600 Ingrowing nail: Secondary | ICD-10-CM

## 2016-01-06 DIAGNOSIS — E291 Testicular hypofunction: Secondary | ICD-10-CM

## 2016-01-06 DIAGNOSIS — R0989 Other specified symptoms and signs involving the circulatory and respiratory systems: Secondary | ICD-10-CM

## 2016-01-06 DIAGNOSIS — L539 Erythematous condition, unspecified: Secondary | ICD-10-CM | POA: Diagnosis not present

## 2016-01-06 DIAGNOSIS — J189 Pneumonia, unspecified organism: Secondary | ICD-10-CM

## 2016-01-06 DIAGNOSIS — J849 Interstitial pulmonary disease, unspecified: Secondary | ICD-10-CM | POA: Diagnosis not present

## 2016-01-06 DIAGNOSIS — Z720 Tobacco use: Secondary | ICD-10-CM | POA: Diagnosis not present

## 2016-01-06 IMAGING — DX DG CHEST 2V
2 series · 2 of 2 positions shown · non-contrast
Comparison: [DATE] .

CLINICAL DATA: Chest congestion.

EXAM:
CHEST  2 VIEW

[chest pa]
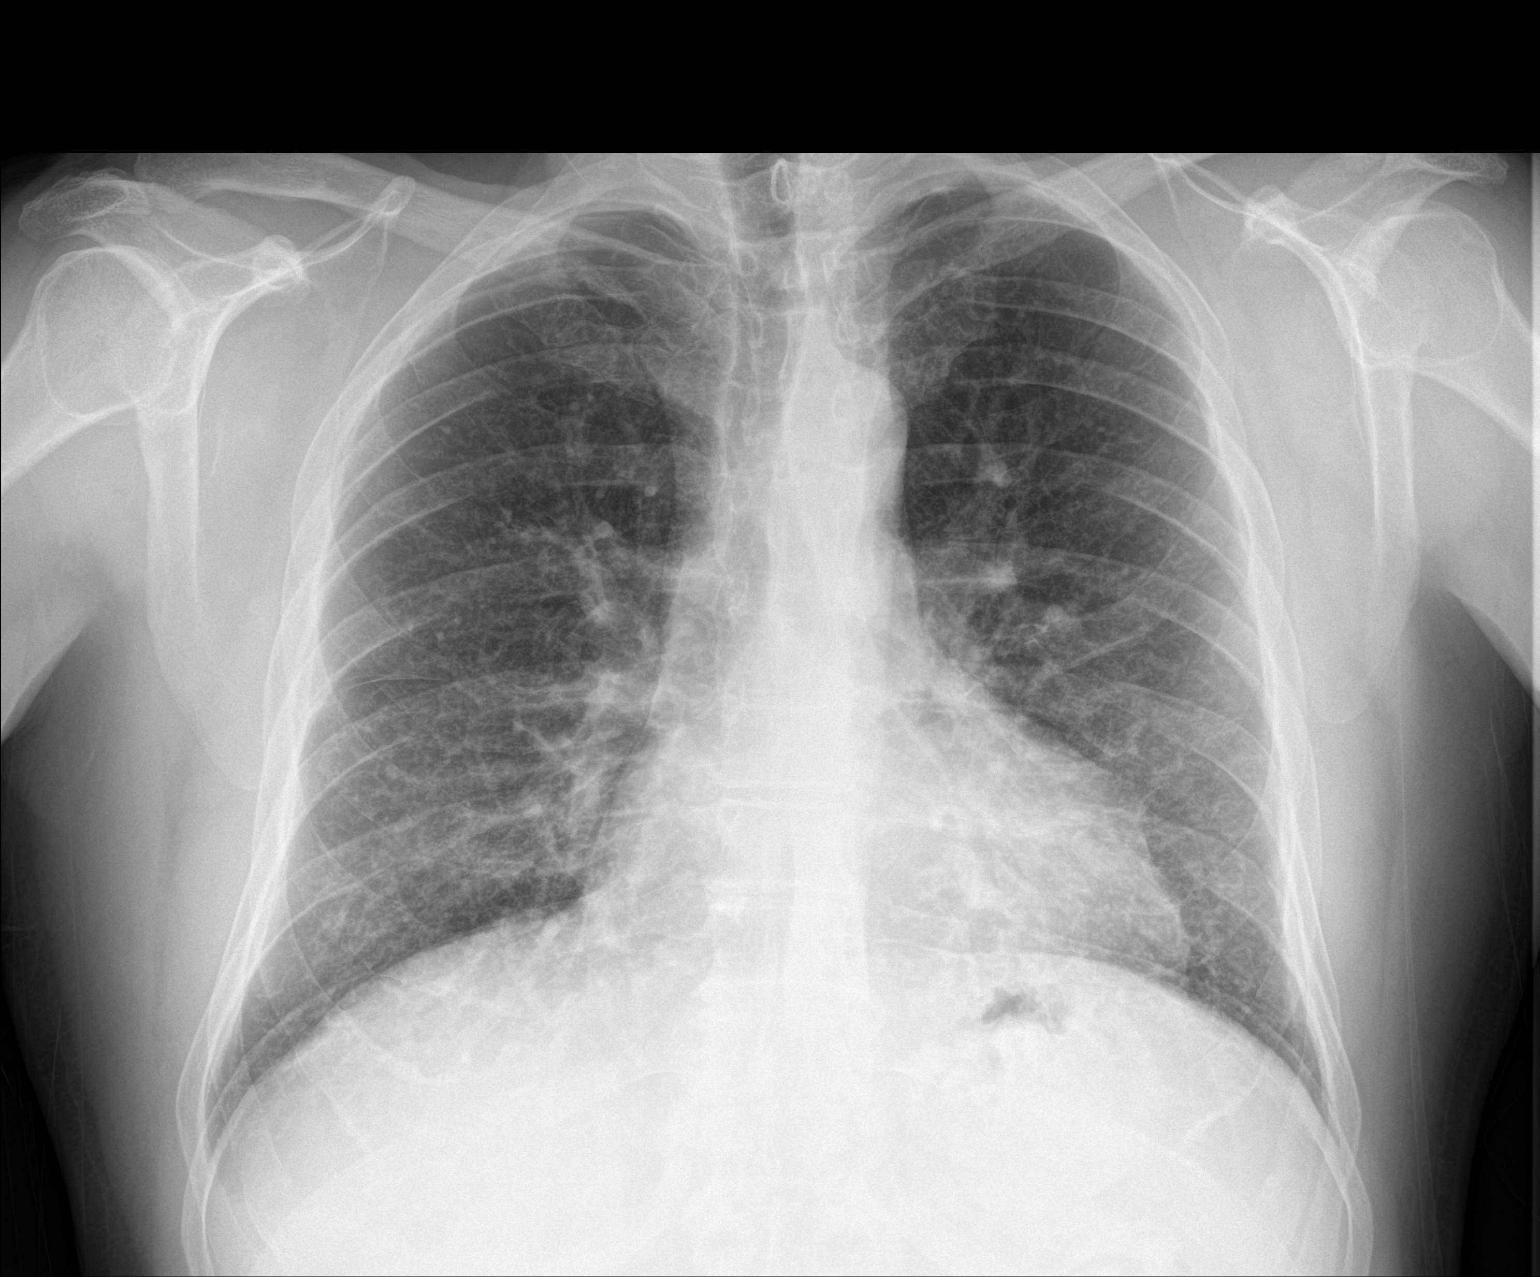

[chest lat]
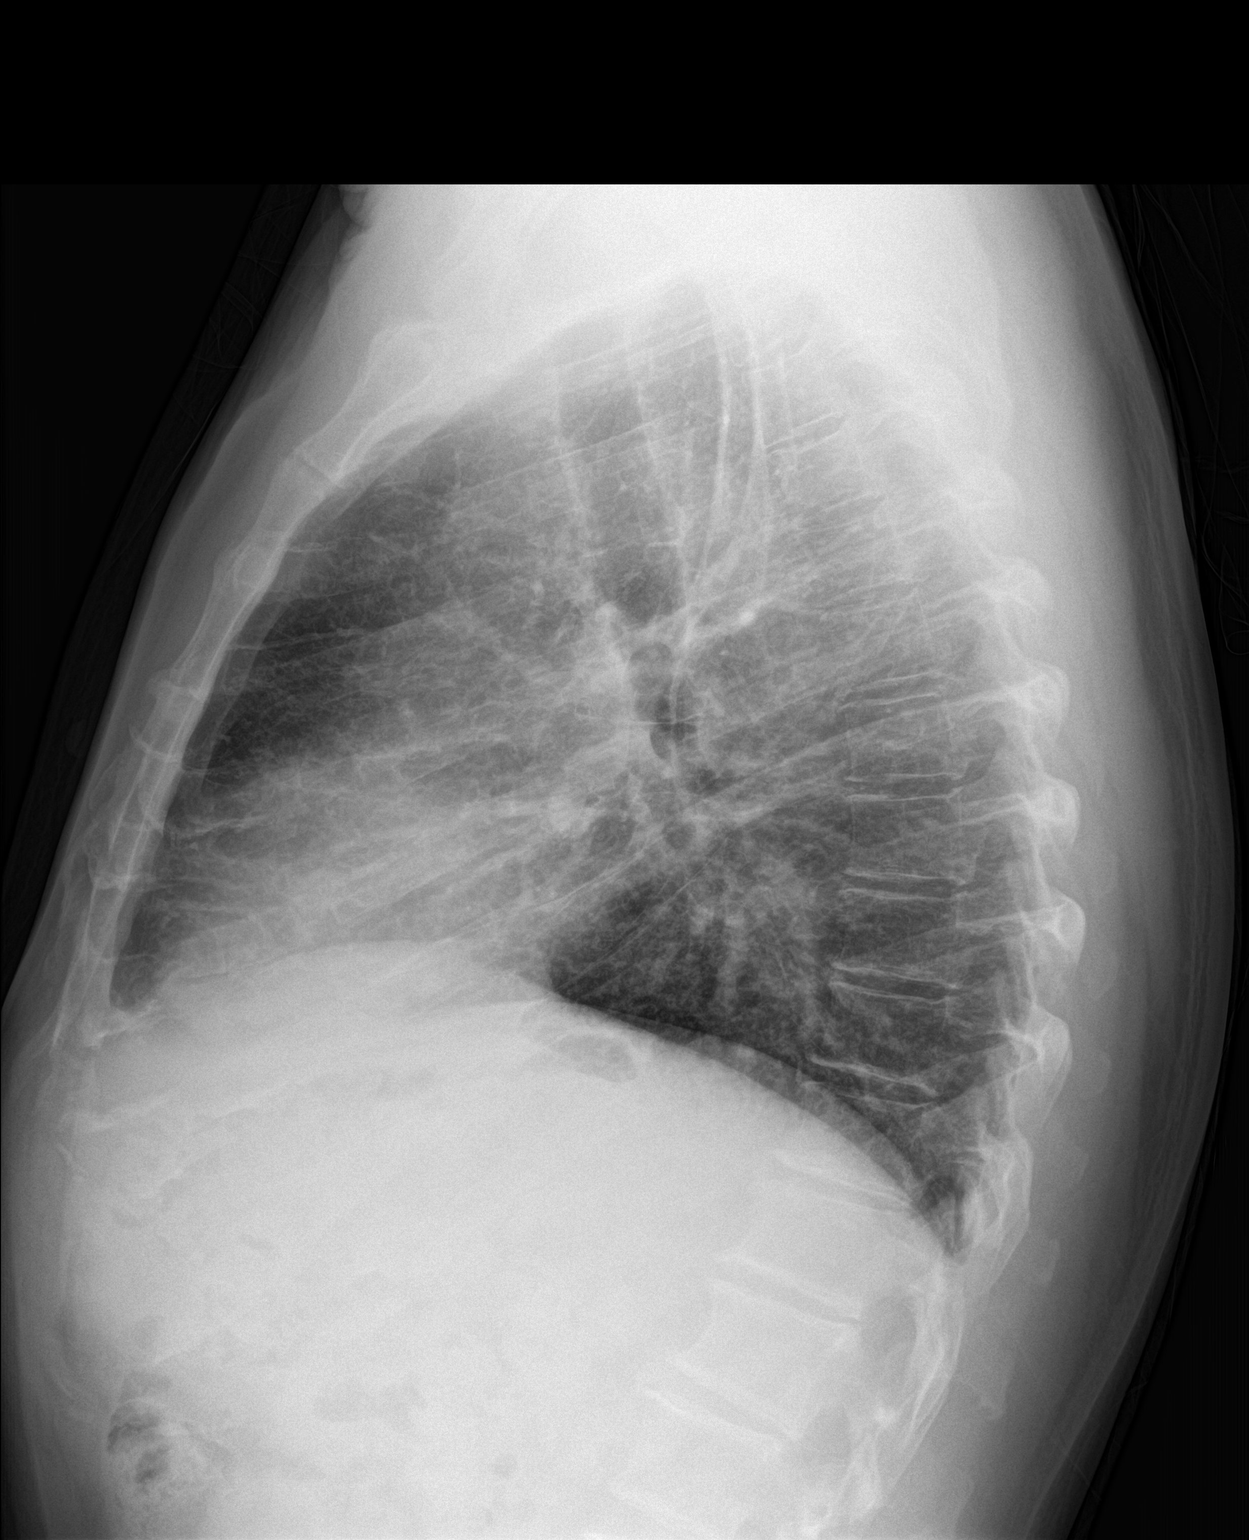

[2 of 2 positions shown; findings below may reference images not displayed]

FINDINGS: Mediastinum hilar structures normal. Stable cardiomegaly. No
prominent pulmonary venous congestion. Bilateral mild interstitial
prominence noted. Similar findings noted on prior exams. Findings
most likely related chronic interstitial lung disease. An active
component interstitial lung disease cannot be excluded .
IMPRESSION: Chronic interstitial lung disease. Superimposed active interstitial
disease including pneumonitis cannot be excluded.

## 2016-01-06 MED ORDER — DOXYCYCLINE HYCLATE 100 MG PO TABS: 100.0000 mg | ORAL_TABLET | Freq: Two times a day (BID) | ORAL | Status: DC

## 2016-01-06 MED ORDER — PREDNISONE 20 MG PO TABS: 40.0000 mg | ORAL_TABLET | Freq: Every day | ORAL | Status: DC

## 2016-01-06 NOTE — Patient Instructions (Addendum)
I have made you an appt at Podiatry to evaluate your toe at 830am with Dr. Cannon Kettle . Soak in epsom salts overnight. I have sent in doxycycline to cover for infection while you are traveling. If the toe becomes more swollen, red or painful take twice daily for 1 week. Otherwise no ABX needed.  I think you chest congestion is related to your smoking. Any amount you can quit will be helpful.  We will get a chest XR to ensure nothing is going on and do lung studies if the cough doesn't go away.   Smoking Cessation, Tips for Success If you are ready to quit smoking, congratulations! You have chosen to help yourself be healthier. Cigarettes bring nicotine, tar, carbon monoxide, and other irritants into your body. Your lungs, heart, and blood vessels will be able to work better without these poisons. There are many different ways to quit smoking. Nicotine gum, nicotine patches, a nicotine inhaler, or nicotine nasal spray can help with physical craving. Hypnosis, support groups, and medicines help break the habit of smoking. WHAT THINGS CAN I DO TO MAKE QUITTING EASIER?  Here are some tips to help you quit for good:  Pick a date when you will quit smoking completely. Tell all of your friends and family about your plan to quit on that date.  Do not try to slowly cut down on the number of cigarettes you are smoking. Pick a quit date and quit smoking completely starting on that day.  Throw away all cigarettes.   Clean and remove all ashtrays from your home, work, and car.  On a card, write down your reasons for quitting. Carry the card with you and read it when you get the urge to smoke.  Cleanse your body of nicotine. Drink enough water and fluids to keep your urine clear or pale yellow. Do this after quitting to flush the nicotine from your body.  Learn to predict your moods. Do not let a bad situation be your excuse to have a cigarette. Some situations in your life might tempt you into wanting a  cigarette.  Never have "just one" cigarette. It leads to wanting another and another. Remind yourself of your decision to quit.  Change habits associated with smoking. If you smoked while driving or when feeling stressed, try other activities to replace smoking. Stand up when drinking your coffee. Brush your teeth after eating. Sit in a different chair when you read the paper. Avoid alcohol while trying to quit, and try to drink fewer caffeinated beverages. Alcohol and caffeine may urge you to smoke.  Avoid foods and drinks that can trigger a desire to smoke, such as sugary or spicy foods and alcohol.  Ask people who smoke not to smoke around you.  Have something planned to do right after eating or having a cup of coffee. For example, plan to take a walk or exercise.  Try a relaxation exercise to calm you down and decrease your stress. Remember, you may be tense and nervous for the first 2 weeks after you quit, but this will pass.  Find new activities to keep your hands busy. Play with a pen, coin, or rubber band. Doodle or draw things on paper.  Brush your teeth right after eating. This will help cut down on the craving for the taste of tobacco after meals. You can also try mouthwash.   Use oral substitutes in place of cigarettes. Try using lemon drops, carrots, cinnamon sticks, or chewing gum. Keep them  handy so they are available when you have the urge to smoke.  When you have the urge to smoke, try deep breathing.  Designate your home as a nonsmoking area.  If you are a heavy smoker, ask your health care provider about a prescription for nicotine chewing gum. It can ease your withdrawal from nicotine.  Reward yourself. Set aside the cigarette money you save and buy yourself something nice.  Look for support from others. Join a support group or smoking cessation program. Ask someone at home or at work to help you with your plan to quit smoking.  Always ask yourself, "Do I need this  cigarette or is this just a reflex?" Tell yourself, "Today, I choose not to smoke," or "I do not want to smoke." You are reminding yourself of your decision to quit.  Do not replace cigarette smoking with electronic cigarettes (commonly called e-cigarettes). The safety of e-cigarettes is unknown, and some may contain harmful chemicals.  If you relapse, do not give up! Plan ahead and think about what you will do the next time you get the urge to smoke. HOW WILL I FEEL WHEN I QUIT SMOKING? You may have symptoms of withdrawal because your body is used to nicotine (the addictive substance in cigarettes). You may crave cigarettes, be irritable, feel very hungry, cough often, get headaches, or have difficulty concentrating. The withdrawal symptoms are only temporary. They are strongest when you first quit but will go away within 10-14 days. When withdrawal symptoms occur, stay in control. Think about your reasons for quitting. Remind yourself that these are signs that your body is healing and getting used to being without cigarettes. Remember that withdrawal symptoms are easier to treat than the major diseases that smoking can cause.  Even after the withdrawal is over, expect periodic urges to smoke. However, these cravings are generally short lived and will go away whether you smoke or not. Do not smoke! WHAT RESOURCES ARE AVAILABLE TO HELP ME QUIT SMOKING? Your health care provider can direct you to community resources or hospitals for support, which may include:  Group support.  Education.  Hypnosis.  Therapy.   This information is not intended to replace advice given to you by your health care provider. Make sure you discuss any questions you have with your health care provider.   Document Released: 03/05/2004 Document Revised: 06/28/2014 Document Reviewed: 11/23/2012 Elsevier Interactive Patient Education [employer].

## 2016-01-06 NOTE — Telephone Encounter (Signed)
Reviewed CXR. Saw interstitial lung disease- noted on past few XR. 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 Saw some active inflammation- possible pneumonitits. Will treat with prednisone and repeat CXR when he is in town on August 4. Plan on pulmonology referral at that point.

## 2016-01-06 NOTE — Progress Notes (Signed)
Subjective:    Patient ID: Brian Wolfe, male    DOB: 1960/12/20, 55 y.o.   MRN: [id number]  HPI: Brian Wolfe is a 55 y.o. male presenting on 01/06/2016 for Nail Problem   HPI  Pt presents for R great toe nail issue. Had ceullitis and abscess of toe in March 2017. Treated with bactrim  and saw podiatry. Was told the nail did not need to come off at that time. Per the patient he had XR and was told it was fine. Toe is sometimes painful when walking. Nail has not improved. No fever. No spreading redness. Toe mildly erythematous at the distal portion. No fevers. No drainage from toe. Initial abscess has healed.  Pt had bronchitis in March. Cough resolved but has intermittent chest congestion since March. Shortness of breath "not on a regular basis"  With exertions- 1-2 times per week. Smokes 1 pack per day x 40 years.  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   Past Medical History  Diagnosis Date  . Headache   . Depression   . GERD (gastroesophageal reflux disease)   . HTN (hypertension)   . Hypogonadism in male   . BPH with obstruction/lower urinary tract symptoms   . Cellulitis of leg   . Skin abscess   . Elevated PSA   . High grade prostatic intraepithelial neoplasia     Current Outpatient Prescriptions on File Prior to Visit  Medication Sig  . acetaminophen-codeine (TYLENOL #3) 300-30 MG tablet Take 1 tablet by mouth every 6 (six) hours as needed for moderate pain.  Marland Kitchen albuterol (PROVENTIL HFA;VENTOLIN HFA) 108 (90 Base) MCG/ACT inhaler Inhale 2 puffs into the lungs every 6 (six) hours as needed for wheezing or shortness of breath.  . ANDROGEL PUMP 20.25 MG/ACT (1.62%) GEL Apply 2 Act topically every morning.  Marland Kitchen atorvastatin (LIPITOR) 20 MG tablet Take 1 tablet (20 mg total) by mouth daily.  Marland Kitchen co-enzyme Q-10 30 MG capsule Take 1 capsule (30 mg total) by mouth daily.  Marland Kitchen dextromethorphan-guaiFENesin (MUCINEX DM) 30-600 MG 12hr tablet Take 1  tablet by mouth 2 (two) times daily.  . pantoprazole (PROTONIX) 40 MG tablet Take 1 tablet (40 mg total) by mouth daily.  . sertraline (ZOLOFT) 25 MG tablet Take 1 tablet (25 mg total) by mouth daily.  . tadalafil (CIALIS) 20 MG tablet Take 1 tablet (20 mg total) by mouth daily as needed for erectile dysfunction.  . terbinafine (LAMISIL) 1 % cream Apply 1 application topically 2 (two) times daily.   No current facility-administered medications on file prior to visit.    Review of Systems  Constitutional: Negative for fever and chills.  Respiratory: Positive for cough and shortness of breath. Negative for chest tightness and wheezing.   Musculoskeletal: Positive for joint swelling.  Skin: Positive for color change.       Rigid nail.    Per HPI unless specifically indicated above     Objective:    BP 118/75 mmHg  Pulse 65  Temp(Src) 98 F (36.7 C) (Oral)  Resp 16  Ht 5\' 5"  (1.651 m)  Wt 168 lb (76.204 kg)  BMI 27.96 kg/m2  SpO2 95%  Wt Readings from Last 3 Encounters:  01/06/16 168 lb (76.204 kg)  12/01/15 168 lb (76.204 kg)  10/10/15 167 lb 3.2 oz (75.841 kg)    Physical Exam  Constitutional: He is oriented to person, place, and time. He appears well-developed and  well-nourished. No distress.  HENT:  Head: Normocephalic and atraumatic.  Neck: Neck supple. No thyromegaly present.  Cardiovascular: Normal rate, regular rhythm and normal heart sounds.  Exam reveals no gallop and no friction rub.   No murmur heard. Pulmonary/Chest: Effort normal and breath sounds normal. He has no wheezes. He has no rhonchi. He has no rales. Chest wall is not dull to percussion. He exhibits no tenderness.  Abdominal: Soft. Bowel sounds are normal. He exhibits no distension. There is no tenderness. There is no rebound.  Musculoskeletal: Normal range of motion. He exhibits no edema or tenderness.       Feet:  Neurological: He is alert and oriented to person, place, and time. He has normal  reflexes.  Skin: Skin is warm and dry. No rash noted. There is erythema (along the distal portion of great toe. ). No cyanosis. Nails show no clubbing.  Thick, ridged, yellow/brown R great toe nail. Non-tender to touch. No swelling. Not warm to touch. Good cap refill.    Psychiatric: He has a normal mood and affect. His behavior is normal. Thought content normal.   Results for orders placed or performed in visit on 10/10/15  PSA  Result Value Ref Range   Prostate Specific Ag, Serum 2.9 0.0 - 4.0 ng/mL  Testosterone  Result Value Ref Range   Testosterone 299 (L) 348 - 1197 ng/dL   Comment, Testosterone Comment   Hematocrit  Result Value Ref Range   Hematocrit 45.5 37.5 - 51.0 %  Estradiol  Result Value Ref Range   Estradiol 12.3 7.6 - 42.6 pg/mL  TSH  Result Value Ref Range   TSH 0.592 0.450 - 4.500 uIU/mL      Assessment & Plan:   Problem List Items Addressed This Visit      Other   Tobacco user    Reviewed tips and trick to help quit smoking. Reviewed health risks of continued smoking.        Other Visit Diagnoses    Toe erythema    -  Primary    R great toe. Likely non-infections due to lack or surrounding erythema. However given patient travel- will cover with doxycycline for increased swelling, redness, or fever while travelling.     Relevant Medications    doxycycline (VIBRA-TABS) 100 MG tablet    Ingrown toenail        Made follow-up appt with podiatry for eval of R great toe.     Chest congestion        Likely 2/2 smokers cough. CXR to r/o infection. Consider spirometry if symptoms continue.     Relevant Orders    DG Chest 2 View       Meds ordered this encounter  Medications  . doxycycline (VIBRA-TABS) 100 MG tablet    Sig: Take 1 tablet (100 mg total) by mouth 2 (two) times daily.    Dispense:  14 tablet    Refill:  0    Order Specific Question:  Supervising Provider    Answer:  Arlis Porta [id number]      Follow up plan: Return if symptoms  worsen or fail to improve.

## 2016-01-06 NOTE — Assessment & Plan Note (Signed)
Reviewed tips and trick to help quit smoking. Reviewed health risks of continued smoking.

## 2016-01-07 LAB — HEMATOCRIT: Hematocrit: 42.6 % (ref 37.5–51.0)

## 2016-01-07 LAB — TESTOSTERONE: TESTOSTERONE: 696 ng/dL (ref 264–916)

## 2016-01-08 ENCOUNTER — Telehealth: Payer: Self-pay

## 2016-01-08 DIAGNOSIS — Z79899 Other long term (current) drug therapy: Secondary | ICD-10-CM

## 2016-01-08 NOTE — Telephone Encounter (Signed)
Spoke with pt wife in reference to testosterone results. Wife stated when it gets closer to time she will make lab appt for pt.

## 2016-01-08 NOTE — Telephone Encounter (Signed)
LMOM

## 2016-01-08 NOTE — Telephone Encounter (Signed)
-----   Message from Nori Riis, PA-C sent at 01/07/2016  5:46 AM EDT ----- Testosterone is great!  I will need to see him in 3 months.  He will need a PSA, HCT and testosterone level prior to the appointment.  Labs do not need to be in the morning.

## 2016-01-09 ENCOUNTER — Other Ambulatory Visit: Payer: Managed Care, Other (non HMO)

## 2016-01-23 ENCOUNTER — Encounter: Payer: Self-pay | Admitting: Sports Medicine

## 2016-01-23 ENCOUNTER — Ambulatory Visit
Admission: RE | Admit: 2016-01-23 | Discharge: 2016-01-23 | Disposition: A | Discharge status: Home / Self Care | Payer: Managed Care, Other (non HMO) | Source: Ambulatory Visit | Attending: Family Medicine | Admitting: Family Medicine

## 2016-01-23 ENCOUNTER — Ambulatory Visit (INDEPENDENT_AMBULATORY_CARE_PROVIDER_SITE_OTHER): Payer: Managed Care, Other (non HMO) | Admitting: Sports Medicine

## 2016-01-23 ENCOUNTER — Other Ambulatory Visit: Payer: Self-pay | Admitting: Family Medicine

## 2016-01-23 DIAGNOSIS — L02611 Cutaneous abscess of right foot: Secondary | ICD-10-CM | POA: Diagnosis not present

## 2016-01-23 DIAGNOSIS — M79671 Pain in right foot: Secondary | ICD-10-CM

## 2016-01-23 DIAGNOSIS — J189 Pneumonia, unspecified organism: Secondary | ICD-10-CM

## 2016-01-23 DIAGNOSIS — J849 Interstitial pulmonary disease, unspecified: Secondary | ICD-10-CM

## 2016-01-23 DIAGNOSIS — L539 Erythematous condition, unspecified: Secondary | ICD-10-CM | POA: Diagnosis not present

## 2016-01-23 DIAGNOSIS — L600 Ingrowing nail: Secondary | ICD-10-CM | POA: Diagnosis not present

## 2016-01-23 DIAGNOSIS — L603 Nail dystrophy: Secondary | ICD-10-CM | POA: Diagnosis not present

## 2016-01-23 DIAGNOSIS — L03031 Cellulitis of right toe: Secondary | ICD-10-CM

## 2016-01-23 IMAGING — CR DG CHEST 2V
1 series · 2 of 2 positions shown · non-contrast
Comparison: [DATE]

CLINICAL DATA: Interstitial lung disease, pneumonitis

EXAM:
CHEST  2 VIEW

[Series 1: dg chest 2 view · 0.14mm/px · 2 of 2 slices shown]
[im 1/2]
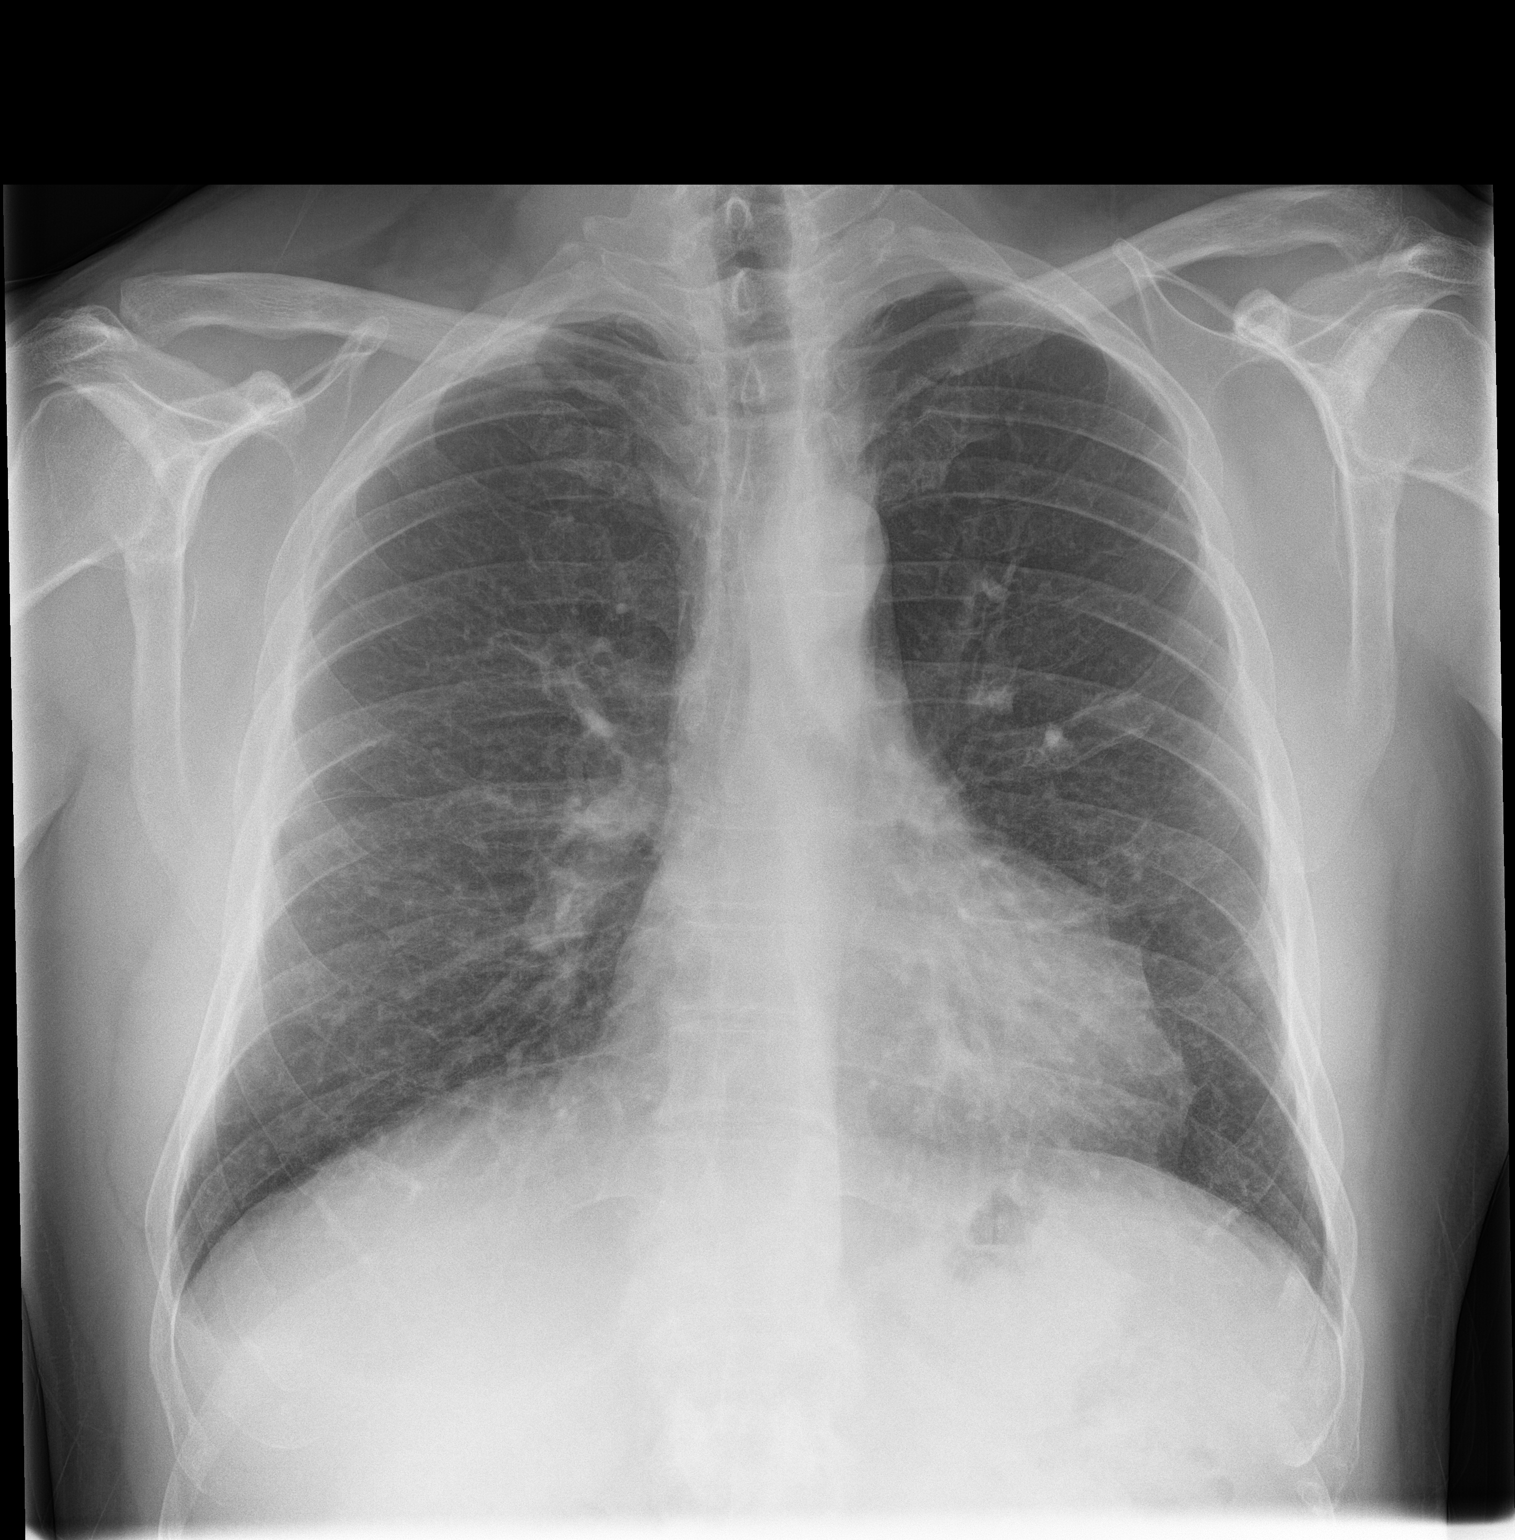
[im 2/2]
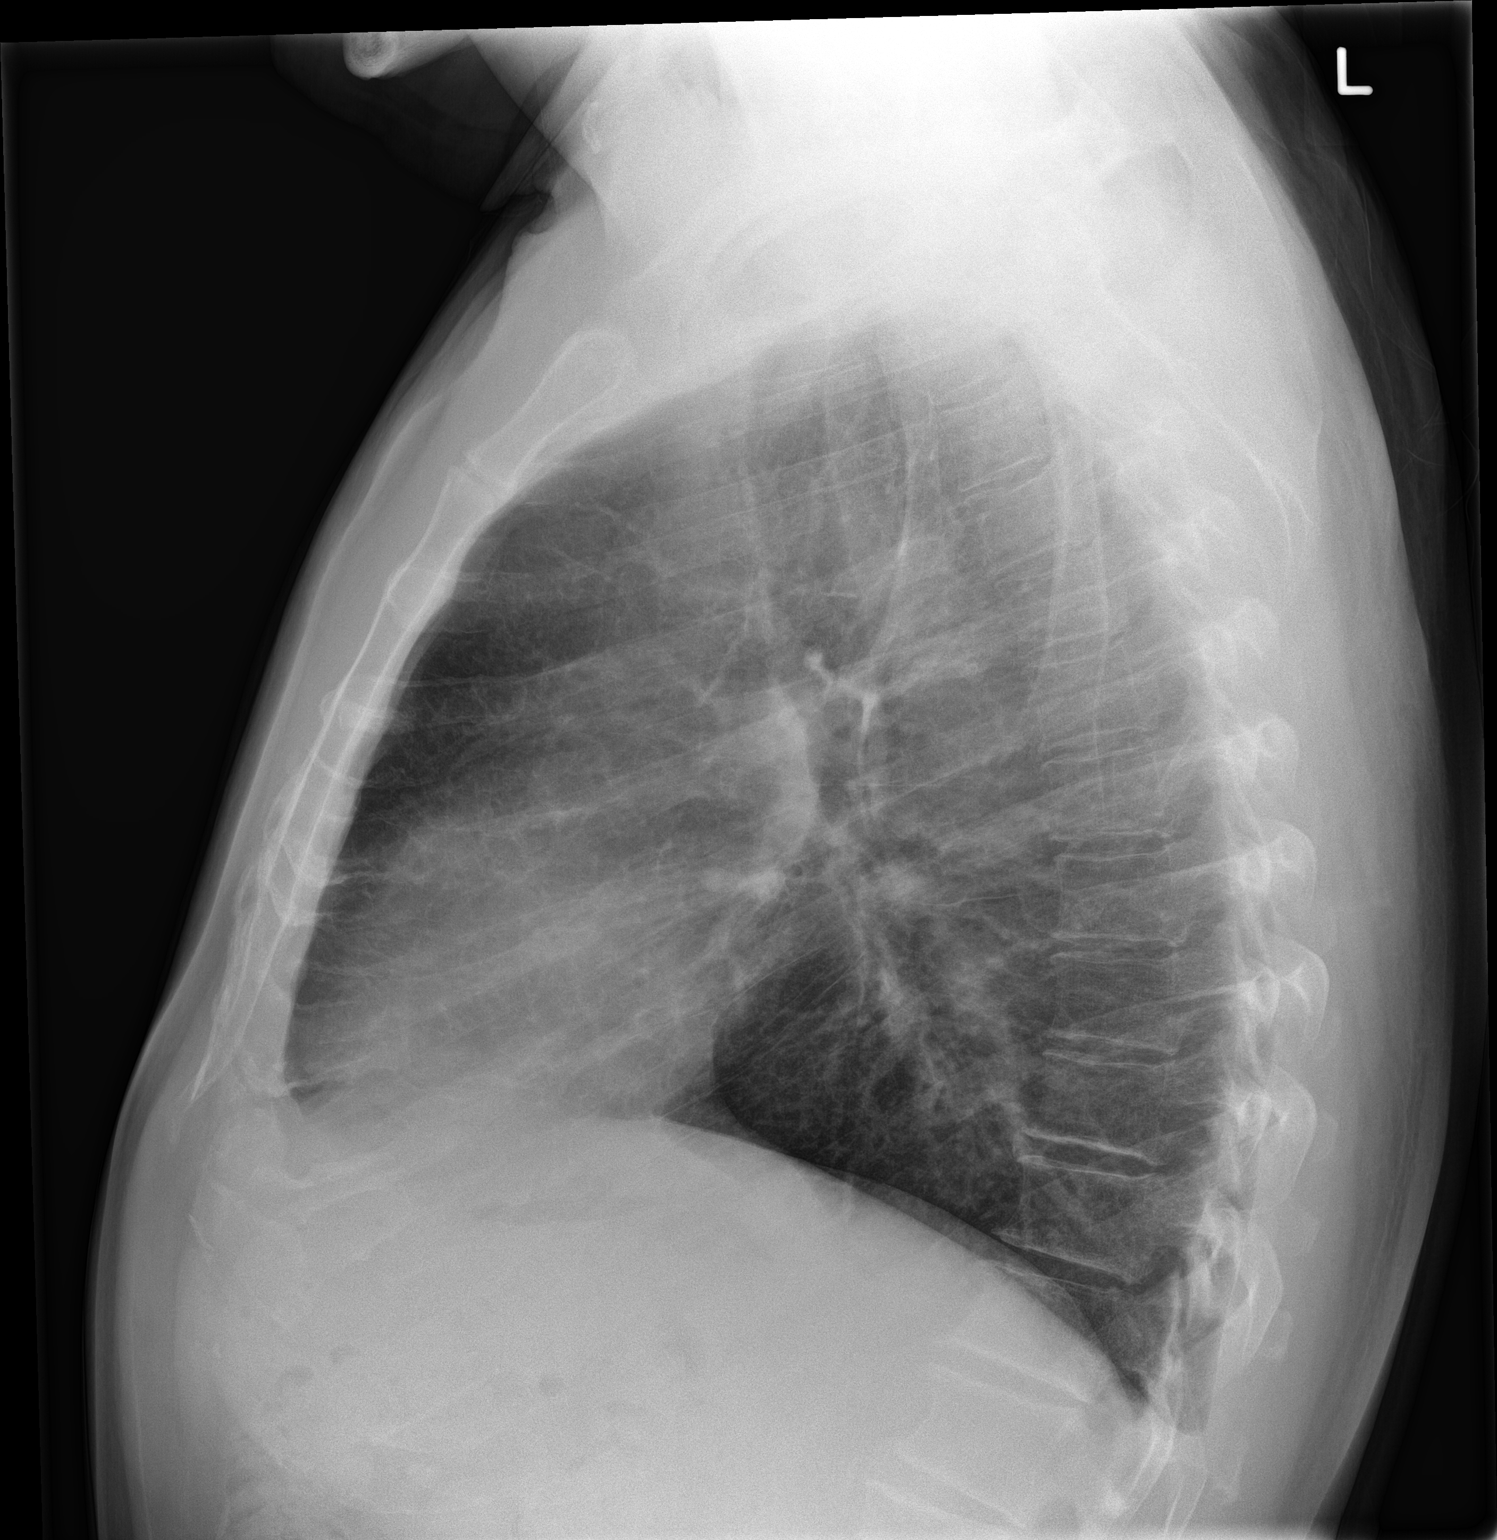

[2 of 2 positions shown; findings below may reference images not displayed]

FINDINGS: Heart is normal size. No effusions or confluent airspace opacity.
Superior chronic interstitial prominence, stable since prior study.
No effusions. No acute bone abnormality.
IMPRESSION: Stable chronic interstitial lung disease.  No active disease.

## 2016-01-23 MED ORDER — DOXYCYCLINE HYCLATE 100 MG PO TABS: 100.0000 mg | ORAL_TABLET | Freq: Two times a day (BID) | ORAL | 0 refills | Status: DC

## 2016-01-23 NOTE — Progress Notes (Signed)
Subjective: Brian Wolfe is a 55 y.o. male patient presents to office today complaining of a painful thick, red, hot, swollen right big toenail that has been ongoing for 10 years after trauma with recent infection completed doxycyline. Patient has treated this by self trimming. Patient denies fever/chills/nausea/vomitting/any other related constitutional symptoms at this time.  Patient Active Problem List   Diagnosis Date Noted  . Erectile dysfunction of organic origin 10/13/2015  . Hypogonadism in male 04/15/2015  . Erectile dysfunction 04/15/2015  . BPH with obstruction/lower urinary tract symptoms 04/15/2015  . High grade prostatic intraepithelial neoplasia 04/14/2015  . Elevated PSA 04/14/2015  . Benign enlargement of prostate 04/14/2015  . Hyperlipidemia 01/07/2015  . Anxiety and depression 01/03/2015  . GERD (gastroesophageal reflux disease) 01/03/2015  . Tobacco user 01/03/2015    Current Outpatient Prescriptions on File Prior to Visit  Medication Sig Dispense Refill  . acetaminophen-codeine (TYLENOL #3) 300-30 MG tablet Take 1 tablet by mouth every 6 (six) hours as needed for moderate pain. 15 tablet 0  . albuterol (PROVENTIL HFA;VENTOLIN HFA) 108 (90 Base) MCG/ACT inhaler Inhale 2 puffs into the lungs every 6 (six) hours as needed for wheezing or shortness of breath. 1 Inhaler 0  . ANDROGEL PUMP 20.25 MG/ACT (1.62%) GEL Apply 2 Act topically every morning. 75 g 5  . atorvastatin (LIPITOR) 20 MG tablet Take 1 tablet (20 mg total) by mouth daily. 30 tablet 11  . co-enzyme Q-10 30 MG capsule Take 1 capsule (30 mg total) by mouth daily.    Marland Kitchen dextromethorphan-guaiFENesin (MUCINEX DM) 30-600 MG 12hr tablet Take 1 tablet by mouth 2 (two) times daily. 20 tablet 0  . pantoprazole (PROTONIX) 40 MG tablet Take 1 tablet (40 mg total) by mouth daily. 30 tablet 11  . predniSONE (DELTASONE) 20 MG tablet Take 2 tablets (40 mg total) by mouth daily with breakfast. 10 tablet 0  . sertraline  (ZOLOFT) 25 MG tablet Take 1 tablet (25 mg total) by mouth daily. 30 tablet 11  . tadalafil (CIALIS) 20 MG tablet Take 1 tablet (20 mg total) by mouth daily as needed for erectile dysfunction. 6 tablet 12  . terbinafine (LAMISIL) 1 % cream Apply 1 application topically 2 (two) times daily. 30 g 2   No current facility-administered medications on file prior to visit.     Allergies  Allergen Reactions  . Bactrim [Sulfamethoxazole-Trimethoprim] Nausea And Vomiting    Objective:  There were no vitals filed for this visit.  General: Well developed, nourished, in no acute distress, alert and oriented x3   Dermatology: Skin is warm, dry and supple bilateral. Right hallux nail appears to be  severely incurvated with severe thickening and hyperkeratosis formation at the distal aspects of the medial and lateral nail borders. (+) Erythema. (+) Edema. (-) serosanguous drainage present. The remaining nails appear unremarkable at this time. There are no open sores, lesions or other signs of infection present.  Vascular: Dorsalis Pedis artery and Posterior Tibial artery pedal pulses are palpable bilateral with immedate capillary fill time. Pedal hair growth present. Trace lower extremity edema.   Neruologic: Grossly intact via light touch bilateral.  Musculoskeletal: Tenderness to palpation of the right hallux nail fold(s). Muscular strength within normal limits in all groups bilateral.   Assesement and Plan: Problem List Items Addressed This Visit    None    Visit Diagnoses    Foot pain, right    -  Primary   Ingrown nail  Cellulitis and abscess of toe, right       Nail dystrophy       Toe erythema       R great toe. Likely non-infections due to lack or surrounding erythema. However given patient travel- will cover with doxycycline for increased swelling, rednes   Relevant Medications   doxycycline (VIBRA-TABS) 100 MG tablet     -Discussed treatment alternatives and plan of care;  Explained permanent/temporary nail avulsion and post procedure course to patient. Patient opt for total permanent avulsion right hallux nail.  - After a verbal consent, injected 3 ml of a 50:50 mixture of 2% plain  lidocaine and 0.5% plain marcaine in a normal hallux block fashion. Next, a  betadine prep was performed. Anesthesia was tested and found to be appropriate.  The right hallux nail was then incised from the hyponychium to the epinychium. The total nail was removed and cleared from the field. The area was curretted for any remaining nail or spicules. Phenol application performed and the area was then flushed with alcohol and dressed with antibiotic cream and a dry sterile dressing. -Patient was instructed to leave the dressing intact for today and begin soaking  in a weak solution of betadine and water tomorrow. Patient was instructed to  soak for 15 minutes each day and apply neosporin and a gauze or bandaid dressing each day. -Rx Doxycyline for preventive measures  -Patient was instructed to monitor the toe for signs of infection and return to office if toe becomes red, hot or swollen. -Advised ice, elevation, and tylenol or motrin if needed for pain.  -Patient is to return in 1-2 weeks for follow up care/nail check or sooner if problems arise. 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  Landis Martins, DPM

## 2016-01-23 NOTE — Patient Instructions (Signed)

## 2016-01-29 ENCOUNTER — Ambulatory Visit: Payer: Managed Care, Other (non HMO) | Admitting: Internal Medicine

## 2016-02-03 ENCOUNTER — Other Ambulatory Visit: Payer: Self-pay | Admitting: Family Medicine

## 2016-02-03 DIAGNOSIS — F32A Depression, unspecified: Secondary | ICD-10-CM

## 2016-02-03 DIAGNOSIS — E785 Hyperlipidemia, unspecified: Secondary | ICD-10-CM

## 2016-02-03 DIAGNOSIS — F419 Anxiety disorder, unspecified: Secondary | ICD-10-CM

## 2016-02-03 DIAGNOSIS — F329 Major depressive disorder, single episode, unspecified: Secondary | ICD-10-CM

## 2016-02-10 ENCOUNTER — Encounter: Payer: Self-pay | Admitting: Urology

## 2016-02-10 ENCOUNTER — Ambulatory Visit (INDEPENDENT_AMBULATORY_CARE_PROVIDER_SITE_OTHER): Payer: Managed Care, Other (non HMO) | Admitting: Urology

## 2016-02-10 VITALS — BP 121/81 | HR 65 | Ht 65.0 in | Wt 167.1 lb

## 2016-02-10 DIAGNOSIS — T148XXA Other injury of unspecified body region, initial encounter: Secondary | ICD-10-CM

## 2016-02-10 DIAGNOSIS — E291 Testicular hypofunction: Secondary | ICD-10-CM | POA: Diagnosis not present

## 2016-02-10 DIAGNOSIS — T148 Other injury of unspecified body region: Secondary | ICD-10-CM

## 2016-02-10 MED ORDER — CEPHALEXIN 500 MG PO CAPS: 500.0000 mg | ORAL_CAPSULE | Freq: Four times a day (QID) | ORAL | 0 refills | Status: DC
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
## 2016-02-12 ENCOUNTER — Encounter: Payer: Self-pay | Admitting: Pulmonary Disease

## 2016-02-12 ENCOUNTER — Ambulatory Visit (INDEPENDENT_AMBULATORY_CARE_PROVIDER_SITE_OTHER): Payer: Managed Care, Other (non HMO) | Admitting: Pulmonary Disease

## 2016-02-12 VITALS — BP 136/78 | HR 85 | Ht 65.0 in | Wt 166.7 lb

## 2016-02-12 DIAGNOSIS — R938 Abnormal findings on diagnostic imaging of other specified body structures: Secondary | ICD-10-CM

## 2016-02-12 DIAGNOSIS — R9389 Abnormal findings on diagnostic imaging of other specified body structures: Secondary | ICD-10-CM

## 2016-02-12 DIAGNOSIS — Z720 Tobacco use: Secondary | ICD-10-CM

## 2016-02-12 DIAGNOSIS — J42 Unspecified chronic bronchitis: Secondary | ICD-10-CM

## 2016-02-12 DIAGNOSIS — F17200 Nicotine dependence, unspecified, uncomplicated: Secondary | ICD-10-CM

## 2016-02-12 MED ORDER — UMECLIDINIUM-VILANTEROL 62.5-25 MCG/INH IN AEPB: 1.0000 | INHALATION_SPRAY | Freq: Every day | RESPIRATORY_TRACT | 0 refills | Status: AC

## 2016-02-12 MED ORDER — UMECLIDINIUM-VILANTEROL 62.5-25 MCG/INH IN AEPB: 1.0000 | INHALATION_SPRAY | Freq: Every day | RESPIRATORY_TRACT | Status: DC

## 2016-02-12 NOTE — Patient Instructions (Signed)
Smoking cessation Trial of Anoro inhaler - sample provided and order sent to your pharmacy Follow up in 6 weeks with lung function tests

## 2016-02-12 NOTE — Progress Notes (Signed)
Patient ID: Brian Wolfe, male   DOB: [birth year], 55 y.o.   MRN: [id number] Patient seen in the office today and instructed on use of anoro ellipta.  Patient expressed understanding and demonstrated technique.

## 2016-02-13 ENCOUNTER — Telehealth: Payer: Self-pay | Admitting: Pulmonary Disease

## 2016-02-13 ENCOUNTER — Ambulatory Visit (INDEPENDENT_AMBULATORY_CARE_PROVIDER_SITE_OTHER): Payer: Managed Care, Other (non HMO) | Admitting: Sports Medicine

## 2016-02-13 ENCOUNTER — Encounter: Payer: Self-pay | Admitting: Sports Medicine

## 2016-02-13 DIAGNOSIS — M79671 Pain in right foot: Secondary | ICD-10-CM

## 2016-02-13 DIAGNOSIS — Z9889 Other specified postprocedural states: Secondary | ICD-10-CM

## 2016-02-13 DIAGNOSIS — L539 Erythematous condition, unspecified: Secondary | ICD-10-CM

## 2016-02-13 MED ORDER — UMECLIDINIUM-VILANTEROL 62.5-25 MCG/INH IN AEPB: 1.0000 | INHALATION_SPRAY | Freq: Every day | RESPIRATORY_TRACT | 5 refills | Status: DC

## 2016-02-13 NOTE — Telephone Encounter (Signed)
*  STAT* If patient is at the pharmacy, call can be transferred to refill team.   1. Which medications need to be refilled? (please list name of each medication and dose if known) New medication ??? Inhaler???  2. Which pharmacy/location (including street and city if local pharmacy) is medication to be sent to? [facility]  3. Do they need a 30 day or 90 day supply? 30 day  Should have been called in already.

## 2016-02-13 NOTE — Telephone Encounter (Signed)
rx has been sent to preferred pharmacy. Pt aware & voiced understanding. Nothing further needed.

## 2016-02-13 NOTE — Progress Notes (Signed)
Subjective: Brian Wolfe is a 55 y.o. male patient returns to office today for follow up evaluation after having Right total hallux permanent nail avulsion performed on 01-23-16. Patient has been soaking using betadine and applying topical antibiotic covered with bandaid when he can. 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 States that he has finished his Doxycyline with no issues and that the pain is less in the toe. Patient deniesfever/chills/nausea/vomitting/any other related constitutional symptoms at this time.  Patient Active Problem List   Diagnosis Date Noted  . Erectile dysfunction of organic origin 10/13/2015  . Hypogonadism in male 04/15/2015  . Erectile dysfunction 04/15/2015  . BPH with obstruction/lower urinary tract symptoms 04/15/2015  . High grade prostatic intraepithelial neoplasia 04/14/2015  . Elevated PSA 04/14/2015  . Benign enlargement of prostate 04/14/2015  . Hyperlipidemia 01/07/2015  . Anxiety and depression 01/03/2015  . GERD (gastroesophageal reflux disease) 01/03/2015  . Tobacco user 01/03/2015    Current Outpatient Prescriptions on File Prior to Visit  Medication Sig Dispense Refill  . albuterol (PROVENTIL HFA;VENTOLIN HFA) 108 (90 Base) MCG/ACT inhaler Inhale 2 puffs into the lungs every 6 (six) hours as needed for wheezing or shortness of breath. 1 Inhaler 0  . atorvastatin (LIPITOR) 20 MG tablet TAKE ONE (1) TABLET BY MOUTH EVERY DAY 30 tablet 11  . cephALEXin (KEFLEX) 500 MG capsule Take 1 capsule (500 mg total) by mouth 4 (four) times daily. 14 capsule 0  . co-enzyme Q-10 30 MG capsule Take 1 capsule (30 mg total) by mouth daily.    . pantoprazole (PROTONIX) 40 MG tablet Take 1 tablet (40 mg total) by mouth daily. 30 tablet 11  . sertraline (ZOLOFT) 25 MG tablet TAKE ONE (1) TABLET EACH DAY 30 tablet 11   Current Facility-Administered Medications on File Prior to Visit  Medication Dose Route Frequency Provider Last  Rate Last Dose  . umeclidinium-vilanterol (ANORO ELLIPTA) 62.5-25 MCG/INH 1 puff  1 puff Inhalation Daily Wilhelmina Mcardle, MD        Allergies  Allergen Reactions  . Bactrim [Sulfamethoxazole-Trimethoprim] Nausea And Vomiting    Objective:  General: Well developed, nourished, in no acute distress, alert and oriented x3   Dermatology: Skin is warm, dry and supple bilateral. Right hallux nail bed appears to be clean, dry, with mild granular tissue and surrounding eschar/scab. Decreased Erythema.  Mild maceration. (-) Edema . (-) serosanguous drainage present. The remaining nails appear unremarkable at this time. There are no other lesions or other signs of infection  present.  Neurovascular status: Intact. No lower extremity swelling; No pain with calf compression bilateral.  Musculoskeletal: Decreased tenderness to palpation of the Right hallux. Muscular strength within normal limits bilateral.   Assesement and Plan: Problem List Items Addressed This Visit    None    Visit Diagnoses    S/P nail surgery    -  Primary   Foot pain, right       Toe erythema         -Examined patient  -Cleansed right hallux nail bed and gently scrubbed with peroxide and curetted away eschar at site and applied antibiotic cream covered with bandaid.  -Discussed plan of care with patient. -Patient to now begin soaking in a weak solution of Epsom salt and warm water. Patient was instructed to soak for 15-20 minutes each day until the toe appears normal and there is no drainage, redness, tenderness, or swelling at the procedure  site, and apply neosporin and a gauze or bandaid dressing each day as needed. May leave open to air at night. -Educated patient on long term care after nail surgery. -Patient was instructed to monitor the toe for reoccurrence and signs of infection; Patient advised to return to office or go to ER if toe becomes red, hot or swollen. -Patient is to return as needed or sooner if problems  arise.  Landis Martins, DPM

## 2016-02-14 NOTE — Patient Instructions (Signed)
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
## 2016-03-12 ENCOUNTER — Other Ambulatory Visit: Payer: Self-pay

## 2016-03-12 ENCOUNTER — Other Ambulatory Visit: Payer: Managed Care, Other (non HMO)

## 2016-03-12 ENCOUNTER — Ambulatory Visit: Payer: Managed Care, Other (non HMO) | Admitting: Urology

## 2016-03-12 DIAGNOSIS — Z79899 Other long term (current) drug therapy: Secondary | ICD-10-CM

## 2016-03-13 LAB — HEMATOCRIT: Hematocrit: 41.4 % (ref 37.5–51.0)

## 2016-03-13 LAB — PSA: Prostate Specific Ag, Serum: 2.8 ng/mL (ref 0.0–4.0)

## 2016-03-13 LAB — TESTOSTERONE: Testosterone: 348 ng/dL (ref 264–916)

## 2016-03-26 ENCOUNTER — Encounter: Payer: Self-pay | Admitting: Urology

## 2016-03-26 ENCOUNTER — Ambulatory Visit: Payer: Managed Care, Other (non HMO) | Admitting: Urology

## 2016-03-26 ENCOUNTER — Ambulatory Visit (INDEPENDENT_AMBULATORY_CARE_PROVIDER_SITE_OTHER): Payer: Managed Care, Other (non HMO) | Admitting: *Deleted

## 2016-03-26 ENCOUNTER — Ambulatory Visit (INDEPENDENT_AMBULATORY_CARE_PROVIDER_SITE_OTHER): Payer: Managed Care, Other (non HMO) | Admitting: Urology

## 2016-03-26 VITALS — BP 121/79 | HR 65 | Ht 65.0 in | Wt 166.0 lb

## 2016-03-26 DIAGNOSIS — J42 Unspecified chronic bronchitis: Secondary | ICD-10-CM | POA: Diagnosis not present

## 2016-03-26 DIAGNOSIS — N529 Male erectile dysfunction, unspecified: Secondary | ICD-10-CM | POA: Diagnosis not present

## 2016-03-26 DIAGNOSIS — N138 Other obstructive and reflux uropathy: Secondary | ICD-10-CM

## 2016-03-26 DIAGNOSIS — N4231 Prostatic intraepithelial neoplasia: Secondary | ICD-10-CM | POA: Diagnosis not present

## 2016-03-26 DIAGNOSIS — N401 Enlarged prostate with lower urinary tract symptoms: Secondary | ICD-10-CM | POA: Diagnosis not present

## 2016-03-26 DIAGNOSIS — E291 Testicular hypofunction: Secondary | ICD-10-CM | POA: Diagnosis not present

## 2016-03-26 LAB — PULMONARY FUNCTION TEST
DL/VA % PRED: 81 %
DL/VA: 3.48 ml/min/mmHg/L
DLCO UNC % PRED: 78 %
DLCO unc: 20 ml/min/mmHg
FEF 25-75 POST: 2.22 L/s
FEF 25-75 Pre: 2.69 L/sec
FEF2575-%Change-Post: -17 %
FEF2575-%PRED-PRE: 95 %
FEF2575-%Pred-Post: 78 %
FEV1-%Change-Post: -4 %
FEV1-%Pred-Post: 83 %
FEV1-%Pred-Pre: 87 %
FEV1-PRE: 2.79 L
FEV1-Post: 2.66 L
FEV1FVC-%CHANGE-POST: 1 %
FEV1FVC-%Pred-Pre: 102 %
FEV6-%CHANGE-POST: -7 %
FEV6-%PRED-PRE: 89 %
FEV6-%Pred-Post: 83 %
FEV6-Post: 3.29 L
FEV6-Pre: 3.54 L
FEV6FVC-%Pred-Post: 104 %
FEV6FVC-%Pred-Pre: 104 %
FVC-%Change-Post: -5 %
FVC-%Pred-Post: 80 %
FVC-%Pred-Pre: 85 %
FVC-Post: 3.34 L
FVC-Pre: 3.54 L
POST FEV1/FVC RATIO: 80 %
POST FEV6/FVC RATIO: 100 %
PRE FEV1/FVC RATIO: 79 %
Pre FEV6/FVC Ratio: 100 %
RV % pred: 84 %
RV: 1.56 L
TLC % PRED: 94 %
TLC: 5.64 L
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
## 2016-03-26 NOTE — Progress Notes (Signed)
PFT performed today. 

## 2016-03-29 ENCOUNTER — Ambulatory Visit (INDEPENDENT_AMBULATORY_CARE_PROVIDER_SITE_OTHER): Payer: Managed Care, Other (non HMO) | Admitting: Urology

## 2016-03-29 ENCOUNTER — Encounter: Payer: Self-pay | Admitting: Urology

## 2016-03-29 ENCOUNTER — Encounter: Payer: Self-pay | Admitting: Pulmonary Disease

## 2016-03-29 ENCOUNTER — Ambulatory Visit (INDEPENDENT_AMBULATORY_CARE_PROVIDER_SITE_OTHER): Payer: Managed Care, Other (non HMO) | Admitting: Pulmonary Disease

## 2016-03-29 VITALS — BP 136/78 | HR 69 | Ht 65.0 in | Wt 168.0 lb

## 2016-03-29 VITALS — BP 117/69 | HR 71 | Ht 65.0 in | Wt 167.7 lb

## 2016-03-29 DIAGNOSIS — F17200 Nicotine dependence, unspecified, uncomplicated: Secondary | ICD-10-CM

## 2016-03-29 DIAGNOSIS — J410 Simple chronic bronchitis: Secondary | ICD-10-CM | POA: Diagnosis not present

## 2016-03-29 DIAGNOSIS — J40 Bronchitis, not specified as acute or chronic: Secondary | ICD-10-CM | POA: Diagnosis not present

## 2016-03-29 DIAGNOSIS — E291 Testicular hypofunction: Secondary | ICD-10-CM | POA: Diagnosis not present

## 2016-03-29 MED ORDER — ALBUTEROL SULFATE HFA 108 (90 BASE) MCG/ACT IN AERS: 2.0000 | INHALATION_SPRAY | Freq: Four times a day (QID) | RESPIRATORY_TRACT | 2 refills | Status: DC | PRN

## 2016-03-29 MED ORDER — TESTOSTERONE 75 MG IL PLLT
75.0000 mg | PELLET | Freq: Once | Status: AC
  Administered 2016-03-29: 75 mg

## 2016-03-29 NOTE — Patient Instructions (Signed)
Smoking cessation as we discussed Continue Anoro inhaler Follow up in 3 months. We will discuss the possibility of lung cancer screening in the future

## 2016-03-29 NOTE — Progress Notes (Signed)
This is a 55 -year-old male with hypogonadism and he is managed with Testopel. He presents today for Testopel insertion.  Patient is placed on the exam table in the left lateral jackknife position.  Identified upper outer quadrant of hip for insertion; prepped area with Betadine and injected 10 cc's of Lidocaine 1% with Epinephrine to anesthetize superficially and distally along trocar tract.  Made 3 mm incision using 15 blade of scalpel; trocar with sharp ended stylet was inserted into subcutaneous tissue in line with femur. Sharp stylet was withdrawn and 6 pellets were placed into trocar well. Testopel pellets advanced into tissue using blunt ended stylet. Trocar removed and incision closed using 6 Steri-Strips. Cleansed area to remove Betadine and covered Steri-Strips with outer Band-Aid.  Careful inspection of insertion is done and patient informed of post procedure instructions.  He will return in three month for serum testosterone before 9:00am.

## 2016-03-29 NOTE — Progress Notes (Signed)
PULMONARY OFFICE FOLLOW UP NOTE  Requesting MD/Service: Brian Sorrow, NP Date of initial consultation: 02/12/16 Reason for consultation: Smoker, dyspnea, abnormal CXR  PT PROFILE: 50 M smoker referred by AMy Krebs, NP for further evaluation of interstitial prominence on recent CXR   DATA: CXR (01/23/16): chronic interstitial prominence. NACPD CT chest (2010):   mild to moderate emphysematous changes throughout both lungs PFTs (03/26/16): Normal spiro, normal volumes, low normal DLCO  SUBJ:  Improved on Anoro which helps his cough and sputum production. Still smoking 1 ppd. Denies CP, fever, purulent sputum, hemoptysis, LE edema and calf tenderness  OBJ: Vitals:   03/29/16 0853  BP: 136/78  Pulse: 69  SpO2: 95%  Weight: 168 lb (76.2 kg)  Height: 5\' 5"  (1.651 m)     EXAM:  Gen: NAD HEENT: WNL Lungs: breath sounds full, no wheezes or crackles Cardiovascular: Reg, no murmurs noted Abdomen: Soft, nontender, normal BS Ext: without clubbing, cyanosis, edema Neuro: grossly intact Skin: Limited exam, no lesions noted  DATA:   CXR: NNF  IMPRESSION:     ICD-9-CM ICD-10-CM   1. Smoker 305.1 F17.200   2. Simple chronic bronchitis (HCC) 491.0 J41.0   3. Emphysema 4. history of lung nodule - stable > 2 yrs and deemed benign  PLAN:  1) We again discussed smoking cessation and strategies  2) Cont Anoro inhaler as it has provided him symptomatic improvement 3) ROV 3 months or PRN  Brian Border, MD PCCM service Mobile [phone] Pager [phone] 03/29/2016

## 2016-04-23 ENCOUNTER — Telehealth: Payer: Self-pay | Admitting: *Deleted

## 2016-04-23 NOTE — Telephone Encounter (Signed)
Initiated PA for Anoro thru CMM. KeyLouretta Parma PA # [id number] Pt ID: [id number]  Sent for review

## 2016-04-26 ENCOUNTER — Ambulatory Visit: Payer: Managed Care, Other (non HMO) | Admitting: Urology

## 2016-04-26 NOTE — Telephone Encounter (Signed)
Still pending review

## 2016-04-27 NOTE — Telephone Encounter (Signed)
CMM shows still pending.

## 2016-05-03 NOTE — Telephone Encounter (Signed)
Received forms from [place]. Filled out and faxed back.

## 2016-05-04 NOTE — Telephone Encounter (Signed)
Called [employer] they state PA is still pending.

## 2016-05-06 NOTE — Telephone Encounter (Signed)
[employer] and they have informed me that the Anoro is a covered medication. I called [employer] that the medication is covered and to run it through pt's insurance again and if they have any questions to give me a call back here at the office. Nothing further needed.

## 2016-06-22 ENCOUNTER — Telehealth: Payer: Self-pay | Admitting: *Deleted

## 2016-06-22 NOTE — Telephone Encounter (Signed)
Patients wife called back and states se will come pick up the paper work again and have him fill it out and will bring paperwork for Testopel back to the office.

## 2016-06-22 NOTE — Telephone Encounter (Signed)
LMOM for patient's wife to call back. Patient was given a testopel form to fill out and send back for the new year and was given a self addressed envelope to send it back with. I received the paper in the mail the week before Christmas however the patient did not fill it out and only signed it. No date of birth, There were to many of the same name in Epic to pick which patient he really was and I had to wait on them to call. Patient's wife called last week when I was out of the office Min[place] gave the message to me today and I tried to call patient's wife this morning had to leave message.

## 2016-07-09 ENCOUNTER — Telehealth: Payer: Self-pay | Admitting: *Deleted

## 2016-07-09 NOTE — Telephone Encounter (Signed)
LMOM for patient to call back about his Testopel. I was on the phone for 63 minutes and 3 seconds with his insurance company trying to deal with the prior authorization for Testopel I was transferred 6 times and then was going to be transferred back to the original person whom I spoke with that could not help me. I explained to the rep that I could not stay on the phone any longer and that I would call the patient and let him  know what is going on and see if he can call and get me a number of someone that can help.

## 2016-07-09 NOTE — Telephone Encounter (Signed)
Spoke with patient's wife and let her know what was going on and she stated she would get Brian Wolfe to call and get a valid number for the insurance department we need and call me back I offered her my information and told her to tell the patient to give them my contact info to call me if that will help speed things up. Ok with plan and will return call when they find out something.

## 2016-07-15 ENCOUNTER — Telehealth: Payer: Self-pay | Admitting: Urology

## 2016-07-15 NOTE — Telephone Encounter (Signed)
Pt called and wants you to give him a call on new cell# [phone].  He has some insurance information to give you.

## 2016-07-15 NOTE — Telephone Encounter (Signed)
Pt called and

## 2016-07-15 NOTE — Telephone Encounter (Signed)
Spoke with patient and he gave me a different phone number to help. I let him know that I would proceed with everything and call him back with an answer as quick as I can. Patient ok with plan.

## 2016-07-16 ENCOUNTER — Telehealth: Payer: Self-pay | Admitting: *Deleted

## 2016-07-16 NOTE — Telephone Encounter (Signed)
LMOM that I received approval for his Testopel and he can call the office and make his appointment for the Testopel procedure.

## 2016-07-20 ENCOUNTER — Telehealth: Payer: Self-pay | Admitting: *Deleted

## 2016-07-20 NOTE — Telephone Encounter (Signed)
Spoke with patient to ensure he had received my message about Testopel approval. Patient stated yes and thanked me again. He was out of town on the truck and stated he would call and make an appointment when he knew his schedule. I let him know he also needed labs again and it was ok to have a lab appointment a few days before his Testopel appointment. He is ok with the plan. And also wanted me to know that after his next Testopel before he would get the next one his insurance would be changing to [employer]. I let him know that I would give him new paper work at his appointment so we can get him approved before the next appointment with his new insurance. Patient ok with plan.

## 2016-07-29 ENCOUNTER — Other Ambulatory Visit: Payer: Self-pay

## 2016-07-29 DIAGNOSIS — E291 Testicular hypofunction: Secondary | ICD-10-CM

## 2016-07-30 ENCOUNTER — Other Ambulatory Visit: Payer: Managed Care, Other (non HMO)

## 2016-07-30 DIAGNOSIS — E291 Testicular hypofunction: Secondary | ICD-10-CM

## 2016-07-31 LAB — PSA: PROSTATE SPECIFIC AG, SERUM: 3.4 ng/mL (ref 0.0–4.0)

## 2016-07-31 LAB — HEMATOCRIT: HEMATOCRIT: 41.7 % (ref 37.5–51.0)

## 2016-07-31 LAB — TESTOSTERONE: Testosterone: 413 ng/dL (ref 264–916)

## 2016-08-02 ENCOUNTER — Encounter: Payer: Self-pay | Admitting: Urology

## 2016-08-02 ENCOUNTER — Ambulatory Visit (INDEPENDENT_AMBULATORY_CARE_PROVIDER_SITE_OTHER): Payer: Managed Care, Other (non HMO) | Admitting: Urology

## 2016-08-02 VITALS — BP 129/74 | HR 67 | Ht 65.0 in | Wt 167.5 lb

## 2016-08-02 DIAGNOSIS — N4231 Prostatic intraepithelial neoplasia: Secondary | ICD-10-CM

## 2016-08-02 DIAGNOSIS — E291 Testicular hypofunction: Secondary | ICD-10-CM | POA: Diagnosis not present

## 2016-08-02 DIAGNOSIS — N529 Male erectile dysfunction, unspecified: Secondary | ICD-10-CM

## 2016-08-02 DIAGNOSIS — N401 Enlarged prostate with lower urinary tract symptoms: Secondary | ICD-10-CM | POA: Diagnosis not present

## 2016-08-02 DIAGNOSIS — N138 Other obstructive and reflux uropathy: Secondary | ICD-10-CM
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
## 2016-08-03 ENCOUNTER — Telehealth: Payer: Self-pay

## 2016-08-03 LAB — HEPATIC FUNCTION PANEL
ALBUMIN: 4.7 g/dL (ref 3.5–5.5)
ALK PHOS: 74 IU/L (ref 39–117)
ALT: 19 IU/L (ref 0–44)
AST: 14 IU/L (ref 0–40)
BILIRUBIN TOTAL: 0.3 mg/dL (ref 0.0–1.2)
BILIRUBIN, DIRECT: 0.07 mg/dL (ref 0.00–0.40)
TOTAL PROTEIN: 7.5 g/dL (ref 6.0–8.5)

## 2016-08-03 NOTE — Telephone Encounter (Signed)
-----   Message from Nori Riis, PA-C sent at 08/03/2016  8:20 AM EST ----- Please notify the patient that his LFT's are normal.

## 2016-08-03 NOTE — Telephone Encounter (Signed)
LMOM-recent labs are normal 

## 2016-08-30 ENCOUNTER — Other Ambulatory Visit: Payer: Self-pay | Admitting: Family Medicine

## 2016-08-30 DIAGNOSIS — K219 Gastro-esophageal reflux disease without esophagitis: Secondary | ICD-10-CM

## 2016-08-30 MED ORDER — PANTOPRAZOLE SODIUM 40 MG PO TBEC: 40.0000 mg | DELAYED_RELEASE_TABLET | Freq: Every day | ORAL | 2 refills | Status: DC

## 2016-09-07 ENCOUNTER — Ambulatory Visit (INDEPENDENT_AMBULATORY_CARE_PROVIDER_SITE_OTHER): Payer: Managed Care, Other (non HMO) | Admitting: Urology

## 2016-09-07 ENCOUNTER — Encounter: Payer: Self-pay | Admitting: Urology

## 2016-09-07 VITALS — BP 130/86 | HR 69 | Ht 64.0 in | Wt 179.4 lb

## 2016-09-07 DIAGNOSIS — E291 Testicular hypofunction: Secondary | ICD-10-CM

## 2016-09-07 DIAGNOSIS — N4231 Prostatic intraepithelial neoplasia: Secondary | ICD-10-CM
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
## 2016-09-09 LAB — PSA TOTAL (REFLEX TO FREE): Prostate Specific Ag, Serum: 3.2 ng/mL (ref 0.0–4.0)

## 2016-09-09 LAB — TESTOSTERONE, BIOAVAILABLE (M)
TESTOSTERONE: 353 ng/dL (ref 264–916)
Testost., % Free&Weakly Bound: 16 % (ref 9.0–46.0)
Testost., F&W Bound: 56.5 ng/dL (ref 40.0–250.0)

## 2016-09-10 ENCOUNTER — Telehealth: Payer: Self-pay

## 2016-09-10 DIAGNOSIS — E291 Testicular hypofunction: Secondary | ICD-10-CM

## 2016-09-10 NOTE — Telephone Encounter (Signed)
Festus Aloe, MD  Lestine Box, LPN  Cc: Nori Riis, PA-C        Pt PSA back down to 3.2 -- I think it's safe to start him on clomiphene. Recheck PSA in 3 months.    Spoke with pt wife in reference to PSA results. Made pt wife aware would be ok to start clomid. How do you want to start the dosing?

## 2016-09-10 NOTE — Telephone Encounter (Signed)
Clomid 50 mg, 1/2 tablet daily - # 30 and check morning testosterone before 10 AM in one month.

## 2016-09-13 ENCOUNTER — Ambulatory Visit: Payer: Managed Care, Other (non HMO)

## 2016-09-13 MED ORDER — CLOMIPHENE CITRATE 50 MG PO TABS: ORAL_TABLET | ORAL | 0 refills | Status: DC

## 2016-09-13 NOTE — Telephone Encounter (Signed)
Spoke with pt wife in reference to clomid. Wife stated that pt is currently out on the road in [place] but will have medication transferred to start ASAP. Wife will call when pt starts medication for labs.

## 2016-10-20 ENCOUNTER — Telehealth: Payer: Self-pay | Admitting: Urology

## 2016-10-20 DIAGNOSIS — E291 Testicular hypofunction: Secondary | ICD-10-CM

## 2016-10-20 NOTE — Telephone Encounter (Signed)
Pt wife called stating pt will be in town next Friday for labs that need to be drawn per Larene Beach from last visit. Lab appt made for next Fri, May 11, need orders put in please. Thanks.

## 2016-10-20 NOTE — Telephone Encounter (Signed)
Orders placed.

## 2016-10-29 ENCOUNTER — Other Ambulatory Visit: Payer: Managed Care, Other (non HMO)

## 2016-10-29 DIAGNOSIS — E291 Testicular hypofunction: Secondary | ICD-10-CM

## 2016-10-30 LAB — TESTOSTERONE: Testosterone: 548 ng/dL (ref 264–916)

## 2016-11-01 ENCOUNTER — Telehealth: Payer: Self-pay

## 2016-11-01 NOTE — Telephone Encounter (Signed)
PSA was added

## 2016-11-01 NOTE — Telephone Encounter (Signed)
-----   Message from Nori Riis, PA-C sent at 10/30/2016  7:31 PM EDT ----- Please add a PSA to his blood work.

## 2016-11-02 ENCOUNTER — Telehealth: Payer: Self-pay

## 2016-11-02 DIAGNOSIS — E291 Testicular hypofunction: Secondary | ICD-10-CM

## 2016-11-02 MED ORDER — CLOMIPHENE CITRATE 50 MG PO TABS: ORAL_TABLET | ORAL | 2 refills | Status: DC

## 2016-11-02 NOTE — Telephone Encounter (Signed)
Spoke with pt wife in reference to PSA results. Made aware pt will need a f/u in 19mo. Wife then went on to state that pt has become very irritable, cranky, sleepy, groggy, having trouble functioning on clomid. 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 Reinforced with wife pt will need an OV to discuss further treatments with [facility]. Wife voiced understanding. Wife stated that she will find out pt schedule and call back for an appt.

## 2016-11-02 NOTE — Telephone Encounter (Signed)
-----   Message from Nori Riis, PA-C sent at 11/02/2016  1:22 PM EDT ----- Patient's PSA is stable at 3.2.   We will see him in 3 months.  PSA to be drawn before his next appointment along with a morning testosterone, HCT, HBG and LFT's.

## 2016-11-02 NOTE — Telephone Encounter (Signed)
Pt wife called back stating she was able to speak with pt in reference to clomid and possibly changing medications. Per wife pt stated that he would like to stay on the clomid as it is easier for him to take while out on the road. Per wife pt voiced concerns to be he is no longer getting spontaneous erections, does not have the desire for intercourse, and is not able to get an erection if "the time is right". Wife stated that when pt has been on previous testosterone therapies this has not been an issue. Pt inquired about increasing tablet to a whole tablet daily. Please advise.

## 2016-11-02 NOTE — Telephone Encounter (Signed)
That would be fine to increase to a whole tablet daily.

## 2016-11-02 NOTE — Telephone Encounter (Signed)
Spoke to spouse. Gave instructions per Shannon's previous note. Spouse requesting refill on Clomid.  Faxed Rx.

## 2016-11-03 LAB — SPECIMEN STATUS REPORT

## 2016-11-03 LAB — PSA: Prostate Specific Ag, Serum: 3.2 ng/mL (ref 0.0–4.0)

## 2016-11-06 ENCOUNTER — Other Ambulatory Visit: Payer: Self-pay | Admitting: Pulmonary Disease

## 2016-12-08 ENCOUNTER — Ambulatory Visit (INDEPENDENT_AMBULATORY_CARE_PROVIDER_SITE_OTHER): Payer: Managed Care, Other (non HMO) | Admitting: Physician Assistant

## 2016-12-08 ENCOUNTER — Encounter: Payer: Self-pay | Admitting: Physician Assistant

## 2016-12-08 VITALS — BP 122/73 | HR 67 | Temp 98.4°F | Ht 65.0 in | Wt 166.2 lb

## 2016-12-08 DIAGNOSIS — J441 Chronic obstructive pulmonary disease with (acute) exacerbation: Secondary | ICD-10-CM | POA: Diagnosis not present

## 2016-12-08 DIAGNOSIS — Z720 Tobacco use: Secondary | ICD-10-CM | POA: Diagnosis not present

## 2016-12-08 DIAGNOSIS — K219 Gastro-esophageal reflux disease without esophagitis: Secondary | ICD-10-CM

## 2016-12-08 MED ORDER — PANTOPRAZOLE SODIUM 40 MG PO TBEC: 40.0000 mg | DELAYED_RELEASE_TABLET | Freq: Every day | ORAL | 2 refills | Status: DC

## 2016-12-08 MED ORDER — DOXYCYCLINE HYCLATE 100 MG PO TABS: 100.0000 mg | ORAL_TABLET | Freq: Two times a day (BID) | ORAL | 0 refills | Status: AC

## 2016-12-08 MED ORDER — PREDNISONE 10 MG PO TABS: 10.0000 mg | ORAL_TABLET | Freq: Every day | ORAL | 0 refills | Status: AC

## 2016-12-08 MED ORDER — NICOTINE 21 MG/24HR TD PT24: 21.0000 mg | MEDICATED_PATCH | Freq: Every day | TRANSDERMAL | 0 refills | Status: DC

## 2016-12-08 NOTE — Patient Instructions (Signed)
Chronic Obstructive Pulmonary Disease Exacerbation  Chronic obstructive pulmonary disease (COPD) is a common lung problem. In COPD, the flow of air from the lungs is limited. COPD exacerbations are times that breathing gets worse and you need extra treatment. Without treatment they can be life threatening. If they happen often, your lungs can become more damaged. If your COPD gets worse, your doctor may treat you with:  ? Medicines.  ? Oxygen.  ? Different ways to clear your airway, such as using a mask.    Follow these instructions at home:  ? Do not smoke.  ? Avoid tobacco smoke and other things that bother your lungs.  ? If given, take your antibiotic medicine as told. Finish the medicine even if you start to feel better.  ? Only take medicines as told by your doctor.  ? Drink enough fluids to keep your pee (urine) clear or pale yellow (unless your doctor has told you not to).  ? Use a cool mist machine (vaporizer).  ? If you use oxygen or a machine that turns liquid medicine into a mist (nebulizer), continue to use them as told.  ? Keep up with shots (vaccinations) as told by your doctor.  ? Exercise regularly.  ? Eat healthy foods.  ? Keep all doctor visits as told.  Get help right away if:  ? You are very short of breath and it gets worse.  ? You have trouble talking.  ? You have bad chest pain.  ? You have blood in your spit (sputum).  ? You have a fever.  ? You keep throwing up (vomiting).  ? You feel weak, or you pass out (faint).  ? You feel confused.  ? You keep getting worse.  This information is not intended to replace advice given to you by your health care provider. Make sure you discuss any questions you have with your health care provider.  Document Released: 05/27/2011 Document Revised: 11/13/2015 Document Reviewed: 02/09/2013  Elsevier Interactive Patient Education ? 2017 Elsevier Inc[employer]

## 2016-12-08 NOTE — Progress Notes (Signed)
   Subjective:    Patient ID: Brian Wolfe, male    DOB: 02-13-1961, 56 y.o.   MRN: [id number]  Brian Wolfe is a 56 y.o. male presenting on 12/08/2016 for Cough (productive coughing, chest congestion and chest discomfort x 2.5weeks )   HPI   Brian Wolfe smokes 1.5 packs of cigarettes per day, has history of chronic bronchitis and GERD presenting today with productive cough ongoing for > 2 weeks. He reports the cough is associated with increased purulent sputum production, SOB, and wheezing. He has had to use his rescue inhaler more than usual. 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 Interested in quitting, requesting prescription for nicotine patches. Also needs refill on Protonix.   Social History  Substance Use Topics  . Smoking status: Current Every Day Smoker    Packs/day: 1.00    Years: 40.00    Types: Cigarettes  . Smokeless tobacco: Never Used  . Alcohol use 0.0 oz/week     Comment: rarely    Review of Systems Per HPI unless specifically indicated above     Objective:    BP 122/73 (BP Location: Right Arm, Patient Position: Sitting, Cuff Size: Normal)   Pulse 67   Temp 98.4 F (36.9 C) (Oral)   Ht 5\' 5"  (1.651 m)   Wt 166 lb 3.2 oz (75.4 kg)   SpO2 98%   BMI 27.66 kg/m   Wt Readings from Last 3 Encounters:  12/08/16 166 lb 3.2 oz (75.4 kg)  09/07/16 179 lb 6.4 oz (81.4 kg)  08/02/16 167 lb 8 oz (76 kg)    Physical Exam Results for orders placed or performed in visit on 10/29/16  Testosterone  Result Value Ref Range   Testosterone 548 264 - 916 ng/dL  PSA  Result Value Ref Range   Prostate Specific Ag, Serum 3.2 0.0 - 4.0 ng/mL  Specimen status report  Result Value Ref Range   specimen status report Comment       Assessment & Plan:   1. COPD exacerbation (HCC)  - doxycycline (VIBRA-TABS) 100 MG tablet; Take 1 tablet (100 mg total) by mouth 2 (two) times daily.  Dispense:  14 tablet; Refill: 0 - predniSONE (DELTASONE) 10 MG tablet; Take 1 tablet (10 mg total) by mouth daily with breakfast.  Dispense: 5 tablet; Refill: 0  2. Gastroesophageal reflux disease without esophagitis  - pantoprazole (PROTONIX) 40 MG tablet; Take 1 tablet (40 mg total) by mouth daily.  Dispense: 30 tablet; Refill: 2  3. Tobacco user  21 mg patch daily for 6 weeks and then decrease to 14 mg patchy daily for 2 weeks. Should follow up in 6 weeks with Lauren, hasn't been established with anybody since Dr. Raliegh Ip left. Also has some chronic issues with cholesterol and depression that need f/u.   - nicotine (NICODERM CQ - DOSED IN MG/24 HOURS) 21 mg/24hr patch; Place 1 patch (21 mg total) onto the skin daily.  Dispense: 42 patch; Refill: 0   Follow up plan: Return in about 6 weeks (around 01/19/2017) for smoking cessation, cholesterol with Lauren.  Carles Collet, PA-C [facility] 12/08/2016, 10:37 AM

## 2017-01-21 ENCOUNTER — Ambulatory Visit: Payer: Managed Care, Other (non HMO) | Admitting: Nurse Practitioner

## 2017-03-02 ENCOUNTER — Other Ambulatory Visit: Payer: Self-pay

## 2017-03-02 DIAGNOSIS — F419 Anxiety disorder, unspecified: Principal | ICD-10-CM

## 2017-03-02 DIAGNOSIS — F329 Major depressive disorder, single episode, unspecified: Secondary | ICD-10-CM

## 2017-03-02 MED ORDER — SERTRALINE HCL 25 MG PO TABS: ORAL_TABLET | ORAL | 0 refills | Status: DC

## 2017-03-07 ENCOUNTER — Other Ambulatory Visit: Payer: Self-pay

## 2017-03-07 ENCOUNTER — Other Ambulatory Visit: Payer: 59

## 2017-03-07 DIAGNOSIS — E291 Testicular hypofunction: Secondary | ICD-10-CM

## 2017-03-08 ENCOUNTER — Encounter: Payer: Self-pay | Admitting: Nurse Practitioner

## 2017-03-08 ENCOUNTER — Other Ambulatory Visit: Payer: Self-pay | Admitting: Physician Assistant

## 2017-03-08 ENCOUNTER — Ambulatory Visit (INDEPENDENT_AMBULATORY_CARE_PROVIDER_SITE_OTHER): Payer: 59 | Admitting: Nurse Practitioner

## 2017-03-08 VITALS — BP 118/72 | HR 62 | Temp 98.2°F | Ht 65.0 in | Wt 169.4 lb

## 2017-03-08 DIAGNOSIS — Z720 Tobacco use: Secondary | ICD-10-CM

## 2017-03-08 DIAGNOSIS — E782 Mixed hyperlipidemia: Secondary | ICD-10-CM | POA: Diagnosis not present

## 2017-03-08 DIAGNOSIS — K219 Gastro-esophageal reflux disease without esophagitis: Secondary | ICD-10-CM

## 2017-03-08 DIAGNOSIS — T65221A Toxic effect of tobacco cigarettes, accidental (unintentional), initial encounter: Secondary | ICD-10-CM

## 2017-03-08 DIAGNOSIS — J410 Simple chronic bronchitis: Secondary | ICD-10-CM | POA: Diagnosis not present

## 2017-03-08 DIAGNOSIS — F329 Major depressive disorder, single episode, unspecified: Secondary | ICD-10-CM

## 2017-03-08 DIAGNOSIS — Z716 Tobacco abuse counseling: Secondary | ICD-10-CM

## 2017-03-08 DIAGNOSIS — F419 Anxiety disorder, unspecified: Secondary | ICD-10-CM

## 2017-03-08 DIAGNOSIS — F32A Depression, unspecified: Secondary | ICD-10-CM

## 2017-03-08 LAB — HEPATIC FUNCTION PANEL
ALBUMIN: 4.2 g/dL (ref 3.5–5.5)
ALT: 27 IU/L (ref 0–44)
AST: 16 IU/L (ref 0–40)
Alkaline Phosphatase: 51 IU/L (ref 39–117)
BILIRUBIN TOTAL: 0.2 mg/dL (ref 0.0–1.2)
Bilirubin, Direct: 0.08 mg/dL (ref 0.00–0.40)
TOTAL PROTEIN: 6.9 g/dL (ref 6.0–8.5)

## 2017-03-08 LAB — TESTOSTERONE: Testosterone: 436 ng/dL (ref 264–916)

## 2017-03-08 LAB — HEMOGLOBIN: HEMOGLOBIN: 13.4 g/dL (ref 13.0–17.7)

## 2017-03-08 LAB — HEMATOCRIT: HEMATOCRIT: 40.1 % (ref 37.5–51.0)

## 2017-03-08 LAB — PSA: Prostate Specific Ag, Serum: 5.9 ng/mL — ABNORMAL HIGH (ref 0.0–4.0)

## 2017-03-08 MED ORDER — ATORVASTATIN CALCIUM 20 MG PO TABS: ORAL_TABLET | ORAL | 11 refills | Status: DC

## 2017-03-08 MED ORDER — ESCITALOPRAM OXALATE 10 MG PO TABS: 10.0000 mg | ORAL_TABLET | Freq: Every day | ORAL | 5 refills | Status: DC

## 2017-03-08 MED ORDER — SERTRALINE HCL 25 MG PO TABS: ORAL_TABLET | ORAL | 0 refills | Status: DC

## 2017-03-08 MED ORDER — UMECLIDINIUM-VILANTEROL 62.5-25 MCG/INH IN AEPB: INHALATION_SPRAY | RESPIRATORY_TRACT | 5 refills | Status: DC

## 2017-03-08 MED ORDER — NICOTINE 14 MG/24HR TD PT24: 14.0000 mg | MEDICATED_PATCH | Freq: Every day | TRANSDERMAL | 0 refills | Status: DC

## 2017-03-08 MED ORDER — OMEPRAZOLE 20 MG PO CPDR: 20.0000 mg | DELAYED_RELEASE_CAPSULE | Freq: Every day | ORAL | 5 refills | Status: DC

## 2017-03-08 MED ORDER — ALBUTEROL SULFATE HFA 108 (90 BASE) MCG/ACT IN AERS: 2.0000 | INHALATION_SPRAY | Freq: Four times a day (QID) | RESPIRATORY_TRACT | 2 refills | Status: DC | PRN

## 2017-03-08 NOTE — Progress Notes (Addendum)
Subjective:    Patient ID: Brian Wolfe, male    DOB: [birth year], 56 y.o.   MRN: [id number]  Brian Wolfe is a 56 y.o. male presenting on 03/08/2017 for COPD   HPI Med Refills Today - Lipitor:  - Acid reflux - Zoloft  COPD Stable symptoms on albuterol rescue inhaler, anoro ellipta.  He feels the anoro isn't helping symptoms much, but has continued taking this until the last month.  Has not used his rescue inhaler more than 1x per week.  Admits over last 1.5-2 months he has had increased shortness of breath w/ exertion that resolves at rest.  Pt still smokes and would like to quit. Has not reduced smoking despite being started on smoking cessation by another provider. Wants to stop.  Nicotine patch 21mg  caused nausea, so he stopped using these.   Hyperlipidemia No s/sx of ascvd. Pt denies headache, lightheadedness, dizziness, changes in vision, chest tightness/pressure, palpitations, leg swelling, sudden loss of speech or loss of consciousness.  He has been taking atorvastatin 20 mg once daily and is tolerating it well w/o arthralgias or myalgias.   GERD Has symptoms daily.  Takes pantoprazole.  Eats breakfast and then still feels bloated at lunch.   - No heartburn noted, hoarseness, or sore throat. - Feels full and w/ stomach swelling.   Neg diarrhea, pain w/ or after eating. - irregular eating, sleeping w/ job.  No constipation, but does admit he only has one BM q3d and sometimes more frequently. - occasional globus sensation   History of Rash Back of right leg - rash intermittently.  Starts at bump and works up, itchy.  Then none.  Uses lotion.  No current rash today.  Does not know of any exposures to new substances or other possible allergy.  None in past.   Depression Pt does endorse depression, down moods.  Pt feels it is related to low T levels.  Has been taking sertraline 25 mg once daily.  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   Most significant symptoms are fatigue, sleeping too much.  He does have feelings about being down. Pt states is mostly driven by his desire to maintain active sexual encounters but w/ low libido and no ability to have erection.  Pt is receiving treatment for ED, from urology w/ appointment scheduled for tomorrow.   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  Last Thursday, especially bad.  Pt asks why this is occurring. Symptoms started about 2 weeks after starting Clomid.  Instructed pt to ask about this at his next urology visit.  This sweating is not associated w/ shortness of breath or exertion.   Depression screen [facility] 2/9 03/08/2017 07/29/2015 01/03/2015 01/03/2015  Decreased Interest 2 0 1 0  Down, Depressed, Hopeless 2 0 1 0  PHQ - 2 Score 4 0 2 0  Altered sleeping - - 1 -  Tired, decreased energy 3 - 3 -  Change in appetite 2 - 2 -  Feeling bad or failure about yourself  2 - 1 -  Trouble concentrating 1 - 0 -  Moving slowly or fidgety/restless 2 - 0 -  Suicidal thoughts 0 - 0 -  PHQ-9 Score - - 9 -  Difficult doing work/chores Somewhat difficult - Somewhat difficult -    Social History  Substance Use Topics  . Smoking status: Current Every Day Smoker    Packs/day: 1.00    Years:  40.00    Types: Cigarettes  . Smokeless tobacco: Never Used  . Alcohol use 0.0 oz/week     Comment: rarely    Review of Systems Per HPI unless specifically indicated above     Objective:    BP 118/72 (BP Location: Right Arm, Patient Position: Sitting, Cuff Size: Normal)   Pulse 62   Temp 98.2 F (36.8 C) (Oral)   Ht 5\' 5"  (1.651 m)   Wt 169 lb 6.4 oz (76.8 kg)   SpO2 97%   BMI 28.19 kg/m   Wt Readings from Last 3 Encounters:  03/09/17 167 lb 14.4 oz (76.2 kg)  03/08/17 169 lb 6.4 oz (76.8 kg)  12/08/16 166 lb 3.2 oz (75.4 kg)    Physical Exam General - overweight (central adiposity), well-appearing, NAD HEENT - Normocephalic, atraumatic Neck -  supple, non-tender, no LAD, no thyromegaly, no carotid bruit Heart - RRR, no murmurs heard Lungs - Clear throughout all lobes, no wheezing, crackles, or rhonchi. Normal work of breathing. Abdomen - soft, generalized tenderness RUQ, otherwise NTND, no masses, no hepatosplenomegaly, active bowel sounds Extremeties - non-tender, no edema, cap refill < 2 seconds, peripheral pulses intact +2 bilaterally Skin - warm, dry, no rashes Neuro - awake, alert, oriented x3, normal gait Psych - Anxious mood and affect, behavior: continuous leg shaking / restlessness   Results for orders placed or performed in visit on 03/08/17  Lipid panel  Result Value Ref Range   Cholesterol 140 <200 mg/dL   HDL 35 (L) >40 mg/dL   Triglycerides 138 <150 mg/dL   LDL Cholesterol (Calc) 81 mg/dL (calc)   Total CHOL/HDL Ratio 4.0 <5.0 (calc)   Non-HDL Cholesterol (Calc) 105 <130 mg/dL (calc)  BASIC METABOLIC PANEL WITH GFR  Result Value Ref Range   Glucose, Bld 83 65 - 99 mg/dL   BUN 12 7 - 25 mg/dL   Creat 0.91 0.70 - 1.33 mg/dL   GFR, Est Non African American 95 > OR = 60 mL/min/1.37m2   GFR, Est African American 110 > OR = 60 mL/min/1.22m2   BUN/Creatinine Ratio NOT APPLICABLE 6 - 22 (calc)   Sodium 141 135 - 146 mmol/L   Potassium 4.4 3.5 - 5.3 mmol/L   Chloride 108 98 - 110 mmol/L   CO2 25 20 - 32 mmol/L   Calcium 9.4 8.6 - 10.3 mg/dL  TSH  Result Value Ref Range   TSH 0.80 0.40 - 4.50 mIU/L      Assessment & Plan:   Problem List Items Addressed This Visit      Respiratory   Simple chronic bronchitis ([place])    Secondary to smoking.  Currently having some increased symptoms, but has not had anoro inhaler x 1 month.  Likely COPD, but no spirometry testing recently.  Plan: 1. Discussed spirometry. Pt declined. 2. Resume anoro ellipta inhaler once daily. 3. Reviewed rescue inhaler use albuterol 1-2 puffs up to 6x daily prn. 4. Reduce smoking.  5. Follow up 3 months.      Relevant Medications    umeclidinium-vilanterol (ANORO ELLIPTA) 62.5-25 MCG/INH AEPB   albuterol (PROVENTIL HFA;VENTOLIN HFA) 108 (90 Base) MCG/ACT inhaler     Digestive   GERD (gastroesophageal reflux disease)    Currently symptomatic w/ occasional globus sensation.  Otherwise asymptomatic.  Bloating likely 2/2 constipation.  Plan: 1. Pantoprazole preferred agent for insurance.  Continue pantoprazole 20 mg once daily. 2. Reviewed to avoid food triggers. 3. Follow up 6 months.      Relevant  Medications   omeprazole (PRILOSEC) 20 MG capsule   Other Relevant Orders   BASIC METABOLIC PANEL WITH GFR     Other   Anxiety and depression    Not well controlled w/ worsening r/t pt desire for active sexual function and current difficulty w/ ED and decreased libido. PHQ=9 score indicates need to change treatment.  Pt declines dose increase of sertraline.  Plan: 1. STOP sertraline 2. START escitalopram 10 mg once daily. 3. Maintain follow up w/ urology. 4. Follow up 4-6 weeks.      Relevant Medications   escitalopram (LEXAPRO) 10 MG tablet   Hyperlipidemia    Stable prior lipids, but no check in > 1 year.  Pt tolerating atorvastatin 20 mg once daily.  No s/sx of ASCVD.  Plan: 1. Continue atorvastatin 20 mg once daily. 2. Check fasting lipid today. 3. Encouraged heart healthy diet. 4. Follow up 6 months.      Relevant Medications   atorvastatin (LIPITOR) 20 MG tablet   Other Relevant Orders   Lipid panel (Completed)   BASIC METABOLIC PANEL WITH GFR   TSH    Other Visit Diagnoses    Encounter for smoking cessation counseling    -  Primary Discussed need to reduce smoking for improving overall symptoms.  Will likely help ED, breathing, fatigue, and reduce ASCVD risk.  Plan: 1. Reduce nicotine replacement to 14 mg patch once daily.  After 4 weeks, reduce to 7 mg patch once daily. 2. If not effective discussed possibility of using Chantix or Wellbutrin. 3. Follow up 4-6 weeks.  Discussion today >10  minutes specifically on counseling on risks of tobacco use, complications, treatment, smoking cessation.       Meds ordered this encounter  Medications  . DISCONTD: omeprazole (PRILOSEC) 20 MG capsule    Sig: Take 1 capsule (20 mg total) by mouth daily.    Dispense:  30 capsule    Refill:  5  . DISCONTD: atorvastatin (LIPITOR) 20 MG tablet    Sig: TAKE ONE (1) TABLET BY MOUTH EVERY DAY    Dispense:  30 tablet    Refill:  11  . DISCONTD: sertraline (ZOLOFT) 25 MG tablet    Sig: TAKE ONE (1) TABLET EACH DAY    Dispense:  30 tablet    Refill:  0  . DISCONTD: escitalopram (LEXAPRO) 10 MG tablet    Sig: Take 1 tablet (10 mg total) by mouth daily.    Dispense:  30 tablet    Refill:  5  . DISCONTD: nicotine (NICODERM CQ - DOSED IN MG/24 HOURS) 14 mg/24hr patch    Sig: Place 1 patch (14 mg total) onto the skin daily.    Dispense:  28 patch    Refill:  0  . escitalopram (LEXAPRO) 10 MG tablet    Sig: Take 1 tablet (10 mg total) by mouth daily.    Dispense:  30 tablet    Refill:  5  . omeprazole (PRILOSEC) 20 MG capsule    Sig: Take 1 capsule (20 mg total) by mouth daily.    Dispense:  30 capsule    Refill:  5  . atorvastatin (LIPITOR) 20 MG tablet    Sig: TAKE ONE (1) TABLET BY MOUTH EVERY DAY    Dispense:  30 tablet    Refill:  11  . umeclidinium-vilanterol (ANORO ELLIPTA) 62.5-25 MCG/INH AEPB    Sig: INHALE ONE PUFF BY MOUTH DAILY.    Dispense:  1 each    Refill:  5  . albuterol (PROVENTIL HFA;VENTOLIN HFA) 108 (90 Base) MCG/ACT inhaler    Sig: Inhale 2 puffs into the lungs every 6 (six) hours as needed for wheezing or shortness of breath.    Dispense:  1 Inhaler    Refill:  2      Follow up plan: Return in about 1 year (around 03/08/2018) for hyperlipidemia, GERD, anxiety.   Cassell Smiles, DNP, AGPCNP-BC Adult Gerontology Primary Care Nurse Practitioner [facility] 03/11/2017, 12:49 PM
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
## 2017-03-08 NOTE — Patient Instructions (Addendum)
Braedyn, Thank you for coming in to clinic today.  1. For your labs: - Checking thyroid function, kidney, electrolytes, cholesterol. You will be due for FASTING BLOOD WORK (no food or drink after midnight before, only water or coffee without cream/sugar on the morning of)  - Please go ahead and schedule a "Lab Only" visit in the morning at the clinic for lab draw in the next 4 days.  For Lab Results, once available within 2-3 days of blood draw, you can can log in to MyChart online to view your results and a brief explanation. Also, we can discuss results at next follow-up visit.  2. Continue medications as prescribed previously EXCEPT for your acid reflux medication for your next fill. - Continue pantoprazole 40 mg once daily until current bottle empty. THEN, start omeprazole 20 mg once daily and monitor symptoms.  We can go up on this medication if needed.    3. Zoloft: we can increase dose or change medicine if you are not seeing improvement in your moods.  4. START with your 14 mg nicotine patch.  Then, decrease to 7 mg at your next step.   Please schedule a follow-up appointment with Cassell Smiles, AGNP. Return in about 1 year (around 03/08/2018) for hyperlipidemia, GERD, depression.  If you have any other questions or concerns, please feel free to call the clinic or send a message through [place]. You may also schedule an earlier appointment if necessary.  You will receive a survey after today's visit either digitally by e-mail or paper by [employer]. Your experiences and feedback matter to [place].  Please respond so we know how we are doing as we provide care for you.   Cassell Smiles, DNP, AGNP-BC Adult Gerontology Nurse Practitioner [facility]

## 2017-03-09 ENCOUNTER — Ambulatory Visit (INDEPENDENT_AMBULATORY_CARE_PROVIDER_SITE_OTHER): Payer: 59 | Admitting: Urology

## 2017-03-09 ENCOUNTER — Encounter: Payer: Self-pay | Admitting: Urology

## 2017-03-09 ENCOUNTER — Other Ambulatory Visit: Payer: 59

## 2017-03-09 VITALS — BP 139/76 | HR 56 | Ht 65.0 in | Wt 167.9 lb

## 2017-03-09 DIAGNOSIS — N401 Enlarged prostate with lower urinary tract symptoms: Secondary | ICD-10-CM | POA: Diagnosis not present

## 2017-03-09 DIAGNOSIS — N4231 Prostatic intraepithelial neoplasia: Secondary | ICD-10-CM | POA: Diagnosis not present

## 2017-03-09 DIAGNOSIS — R972 Elevated prostate specific antigen [PSA]: Secondary | ICD-10-CM | POA: Diagnosis not present

## 2017-03-09 DIAGNOSIS — N529 Male erectile dysfunction, unspecified: Secondary | ICD-10-CM | POA: Diagnosis not present

## 2017-03-09 DIAGNOSIS — N138 Other obstructive and reflux uropathy: Secondary | ICD-10-CM | POA: Diagnosis not present

## 2017-03-09 DIAGNOSIS — E349 Endocrine disorder, unspecified: Secondary | ICD-10-CM | POA: Diagnosis not present

## 2017-03-09 LAB — LIPID PANEL
Cholesterol: 140 mg/dL (ref <200)
HDL: 35 mg/dL — ABNORMAL LOW (ref >40)
LDL Cholesterol (Calc): 81 mg/dL (calc)
Non-HDL Cholesterol (Calc): 105 mg/dL (calc) (ref <130)
Total CHOL/HDL Ratio: 4 (calc) (ref <5.0)
Triglycerides: 138 mg/dL (ref <150)

## 2017-03-09 LAB — BASIC METABOLIC PANEL WITH GFR
BUN: 12 mg/dL (ref 7–25)
CO2: 25 mmol/L (ref 20–32)
Calcium: 9.4 mg/dL (ref 8.6–10.3)
Chloride: 108 mmol/L (ref 98–110)
Creat: 0.91 mg/dL (ref 0.70–1.33)
GFR, Est African American: 110 mL/min/{1.73_m2} (ref >=60)
GFR, Est Non African American: 95 mL/min/{1.73_m2} (ref >=60)
Glucose, Bld: 83 mg/dL (ref 65–99)
Potassium: 4.4 mmol/L (ref 3.5–5.3)
Sodium: 141 mmol/L (ref 135–146)

## 2017-03-09 LAB — TSH: TSH: 0.8 mIU/L (ref 0.40–4.50)

## 2017-03-09 MED ORDER — SILDENAFIL CITRATE 20 MG PO TABS: ORAL_TABLET | ORAL | 3 refills | Status: DC

## 2017-03-10 ENCOUNTER — Telehealth: Payer: Self-pay | Admitting: *Deleted

## 2017-03-10 ENCOUNTER — Other Ambulatory Visit: Payer: Self-pay

## 2017-03-10 DIAGNOSIS — Z716 Tobacco abuse counseling: Secondary | ICD-10-CM

## 2017-03-10 LAB — PSA: Prostate Specific Ag, Serum: 5.8 ng/mL — ABNORMAL HIGH (ref 0.0–4.0)

## 2017-03-10 MED ORDER — NICOTINE 14 MG/24HR TD PT24: 14.0000 mg | MEDICATED_PATCH | Freq: Every day | TRANSDERMAL | 0 refills | Status: DC

## 2017-03-10 NOTE — Telephone Encounter (Signed)
-----   Message from Nori Riis, PA-C sent at 03/10/2017 12:26 PM EDT ----- Please schedule a prostate biopsy at this time and add a free and total PSA.

## 2017-03-10 NOTE — Telephone Encounter (Signed)
Added Lab tests with Maudie Mercury in our [place] lab  per Margretta Ditty can you please schedule the biopsy for the patient.

## 2017-03-11 ENCOUNTER — Encounter: Payer: Self-pay | Admitting: Nurse Practitioner

## 2017-03-11 ENCOUNTER — Encounter: Payer: Self-pay | Admitting: Urology

## 2017-03-11 ENCOUNTER — Telehealth: Payer: Self-pay

## 2017-03-11 NOTE — Telephone Encounter (Signed)
-----   Message from Nori Riis, PA-C sent at 03/11/2017  7:56 AM EDT ----- Please let Brian Wolfe know that his additional blood work demonstrates a 56% probability of having prostate cancer, so I think it is a good idea to go ahead with the prostate biopsy.

## 2017-03-11 NOTE — Assessment & Plan Note (Signed)
Stable prior lipids, but no check in > 1 year.  Pt tolerating atorvastatin 20 mg once daily.  No s/sx of ASCVD.  Plan: 1. Continue atorvastatin 20 mg once daily. 2. Check fasting lipid today. 3. Encouraged heart healthy diet. 4. Follow up 6 months.

## 2017-03-11 NOTE — Assessment & Plan Note (Signed)
Secondary to smoking.  Currently having some increased symptoms, but has not had anoro inhaler x 1 month.  Likely COPD, but no spirometry testing recently.  Plan: 1. Discussed spirometry. Pt declined. 2. Resume anoro ellipta inhaler once daily. 3. Reviewed rescue inhaler use albuterol 1-2 puffs up to 6x daily prn. 4. Reduce smoking.  5. Follow up 3 months.

## 2017-03-11 NOTE — Telephone Encounter (Signed)
Pt was made aware via mychart message.

## 2017-03-11 NOTE — Assessment & Plan Note (Signed)
Not well controlled w/ worsening r/t pt desire for active sexual function and current difficulty w/ ED and decreased libido. PHQ=9 score indicates need to change treatment.  Pt declines dose increase of sertraline.  Plan: 1. STOP sertraline 2. START escitalopram 10 mg once daily. 3. Maintain follow up w/ urology. 4. Follow up 4-6 weeks.

## 2017-03-11 NOTE — Assessment & Plan Note (Addendum)
Currently symptomatic w/ occasional globus sensation.  Otherwise asymptomatic.  Bloating likely 2/2 constipation.  Plan: 1. Pantoprazole preferred agent for insurance in past.  Try change to omeprazole 20 mg once daily.  If not covered,will continue pantoprazole 20 mg once daily. 2. Reviewed to avoid food triggers. 3. Follow up 6 months.

## 2017-03-17 ENCOUNTER — Other Ambulatory Visit: Payer: 59

## 2017-03-17 LAB — PSA, TOTAL AND FREE
PSA FREE PCT: 9.3 %
PSA FREE: 0.56 ng/mL
Prostate Specific Ag, Serum: 6 ng/mL — ABNORMAL HIGH (ref 0.0–4.0)

## 2017-03-17 LAB — SPECIMEN STATUS REPORT

## 2017-03-22 ENCOUNTER — Ambulatory Visit (INDEPENDENT_AMBULATORY_CARE_PROVIDER_SITE_OTHER): Payer: 59 | Admitting: Urology

## 2017-03-22 ENCOUNTER — Other Ambulatory Visit: Payer: Self-pay | Admitting: Urology

## 2017-03-22 ENCOUNTER — Encounter: Payer: Self-pay | Admitting: Urology

## 2017-03-22 VITALS — BP 136/84 | HR 70 | Ht 65.0 in | Wt 167.5 lb

## 2017-03-22 DIAGNOSIS — R972 Elevated prostate specific antigen [PSA]: Secondary | ICD-10-CM | POA: Diagnosis not present

## 2017-03-22 MED ORDER — GENTAMICIN SULFATE 40 MG/ML IJ SOLN
80.0000 mg | Freq: Once | INTRAMUSCULAR | Status: AC
  Administered 2017-03-22: 80 mg via INTRAMUSCULAR

## 2017-03-22 MED ORDER — LIDOCAINE HCL 2 % EX GEL
1.0000 "application " | Freq: Once | CUTANEOUS | Status: AC
  Administered 2017-03-22: 1 via URETHRAL

## 2017-03-22 MED ORDER — LEVOFLOXACIN 500 MG PO TABS
500.0000 mg | ORAL_TABLET | Freq: Once | ORAL | Status: AC
  Administered 2017-03-22: 500 mg via ORAL

## 2017-03-22 NOTE — Progress Notes (Signed)
Patient follows up for prostate biopsy. He has a rising PSA. His PSA was 2.9 in 2017 and then rose to 3.4 in 2018 and then was 6 in Sept 2018. This was rechecked a couple of days later and noted to be 5.9. 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   Actually, I called him this morning to go over the nature risks and benefits of PSA screening as well as the nature risks and benefits of continued surveillance with a PSA recheck in about a month, prostate MRI, other lab tests as well as the prostate biopsy (I wanted to reach him before the prep). He was a bit upset that the plan might change as he said a biopsy was set up in March and canceled. I told him I was just trying to keep him safe and give him all his options.We went over these options again in office with his wife, too. He understands risk of bleeding and infection which can be serious with a biopsy among other risks. He elected to proceed with biopsy.    Prostate Biopsy Procedure   Informed consent was obtained after discussing risks/benefits of the procedure.  A time out was performed to ensure correct patient identity.  Pre-Procedure: - Last PSA Level: No results found for: PSA - Gentamicin given prophylactically - Levaquin 500 mg administered PO -Normal DRE apart from subtle induration on right  -Transrectal Ultrasound performed revealing a 30.20 gm prostate -No significant hypoechoic or median lobe noted  Procedure: - Prostate block performed using 10 cc 1% lidocaine and biopsies taken from sextant areas, a total of 12 under ultrasound guidance.  Post-Procedure: - Patient tolerated the procedure well - He was counseled to seek immediate medical attention if experiences any severe pain, significant bleeding, or fevers - Return in one week to discuss biopsy results

## 2017-03-26 LAB — PATHOLOGY REPORT

## 2017-03-28 ENCOUNTER — Other Ambulatory Visit: Payer: Self-pay | Admitting: Urology

## 2017-04-01 ENCOUNTER — Telehealth: Payer: Self-pay | Admitting: Urology

## 2017-04-01 ENCOUNTER — Ambulatory Visit: Payer: 59 | Admitting: Urology

## 2017-04-01 ENCOUNTER — Encounter: Payer: Self-pay | Admitting: Urology

## 2017-04-01 NOTE — Telephone Encounter (Signed)
From our conversation from our last office visit, he was on Clomid but he was still irritable.  He has gel at home and wanted to restart the medication.  It will be okay to start the gel and we will recheck his testosterone levels in one month.

## 2017-04-01 NOTE — Telephone Encounter (Signed)
Per Dr.Budzyn his biopsy was negative and he didn't come in today. He said that ya'll had talked about starting him back on some testosterone medication after he had his biopsy. He wants to know if you still want to do this or not? Can you give him a call about this? He is leaving to go back out on the road soon.  Thanks, Sharyn Lull

## 2017-04-05 ENCOUNTER — Other Ambulatory Visit: Payer: Self-pay | Admitting: Urology

## 2017-04-05 MED ORDER — TESTOSTERONE 20.25 MG/1.25GM (1.62%) TD GEL
2.0000 | Freq: Every morning | TRANSDERMAL | 5 refills | Status: DC
Start: 2017-04-05 — End: 2017-08-01

## 2017-04-05 NOTE — Progress Notes (Unsigned)
Please let Brian Wolfe now that I have sent the prescription for the AndroGel to the medical Pharmacy. We will need to recheck his testosterone level in 1 month.

## 2017-04-07 ENCOUNTER — Telehealth: Payer: Self-pay

## 2017-04-07 NOTE — Telephone Encounter (Signed)
Spoke spouse. Gave message per PA-Shannon's previous note.  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 Will c/b to schedule 1 month lab draw after pt starts med.

## 2017-04-10 ENCOUNTER — Encounter: Payer: Self-pay | Admitting: Urology

## 2017-04-18 ENCOUNTER — Encounter: Payer: Self-pay | Admitting: Urology

## 2017-04-21 ENCOUNTER — Encounter: Payer: Self-pay | Admitting: Urology

## 2017-04-22 ENCOUNTER — Telehealth: Payer: Self-pay

## 2017-04-22 NOTE — Telephone Encounter (Signed)
PA for androgel has been APPROVED. PA#[id number]

## 2017-05-27 ENCOUNTER — Encounter: Payer: Self-pay | Admitting: Nurse Practitioner

## 2017-05-31 ENCOUNTER — Other Ambulatory Visit: Payer: Self-pay | Admitting: Family Medicine

## 2017-05-31 ENCOUNTER — Other Ambulatory Visit: Payer: Self-pay | Admitting: Nurse Practitioner

## 2017-05-31 DIAGNOSIS — F32A Depression, unspecified: Secondary | ICD-10-CM

## 2017-05-31 DIAGNOSIS — F329 Major depressive disorder, single episode, unspecified: Secondary | ICD-10-CM

## 2017-05-31 DIAGNOSIS — F419 Anxiety disorder, unspecified: Principal | ICD-10-CM

## 2017-05-31 MED ORDER — ESCITALOPRAM OXALATE 10 MG PO TABS: 10.0000 mg | ORAL_TABLET | Freq: Every day | ORAL | 5 refills | Status: DC

## 2017-06-15 ENCOUNTER — Other Ambulatory Visit: Payer: Self-pay

## 2017-06-15 ENCOUNTER — Other Ambulatory Visit: Payer: 59

## 2017-06-15 DIAGNOSIS — E291 Testicular hypofunction: Secondary | ICD-10-CM

## 2017-06-16 LAB — HEMATOCRIT: Hematocrit: 41.9 % (ref 37.5–51.0)

## 2017-06-16 LAB — TESTOSTERONE: TESTOSTERONE: 629 ng/dL (ref 264–916)

## 2017-06-16 LAB — HEMOGLOBIN: Hemoglobin: 14.5 g/dL (ref 13.0–17.7)

## 2017-06-18 ENCOUNTER — Encounter: Payer: Self-pay | Admitting: Urology

## 2017-06-22 ENCOUNTER — Telehealth: Payer: Self-pay

## 2017-06-22 DIAGNOSIS — E291 Testicular hypofunction: Secondary | ICD-10-CM

## 2017-06-22 NOTE — Telephone Encounter (Signed)
Patient out of town results given to wife, patient will call back to schedule, orders placed

## 2017-06-22 NOTE — Telephone Encounter (Signed)
-----   Message from Nori Riis, PA-C sent at 06/21/2017  9:16 PM EST ----- Please let Brian Wolfe know that his labs are normal.  I would like to see him in February/March with labs prior.  (testosterone, PSA, HCT and HBG).

## 2017-07-27 IMAGING — US US ABDOMEN COMPLETE W/ ELASTOGRAPHY
1 series · 12 of 25 positions shown · non-contrast
Comparison: CT abdomen and pelvis [DATE]

CLINICAL DATA: Nonalcoholic fatty liver disease



[Series 1: us abdomen complete w/ elastography · 0.16mm/px · 12 of 146 slices shown]
[im 7/146]
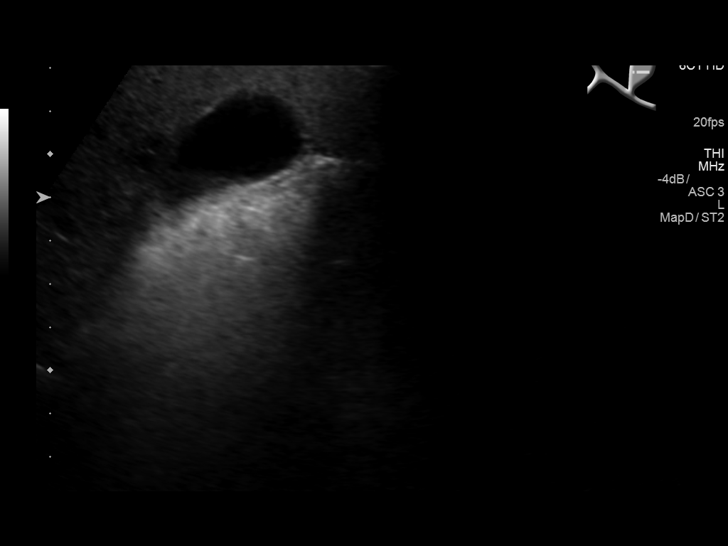
[im 19/146]
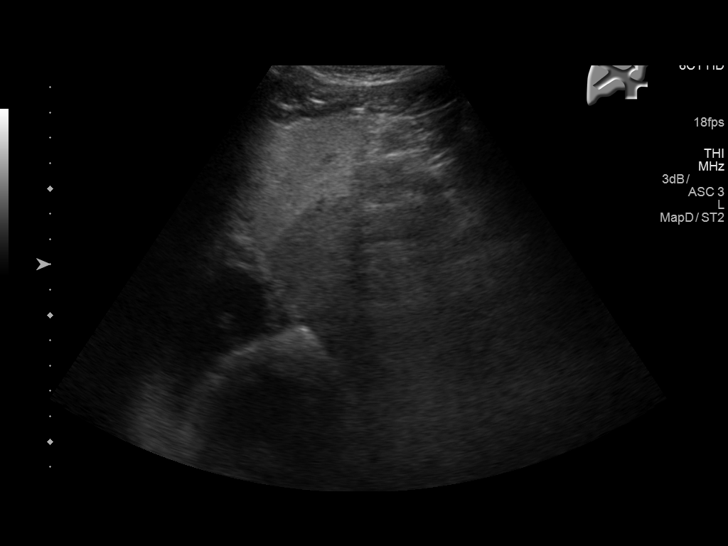
[im 31/146]
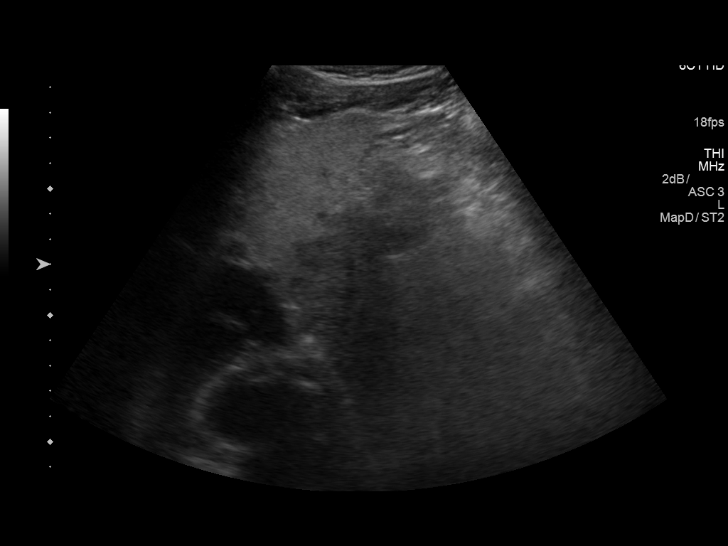
[im 43/146]
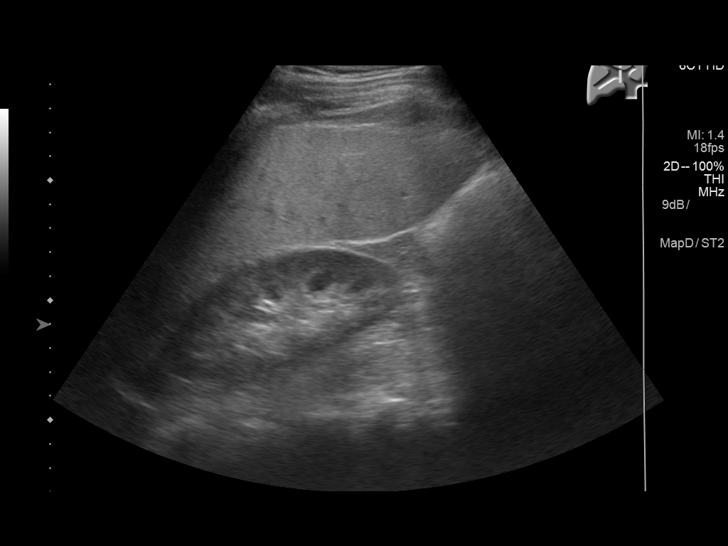
[im 55/146]
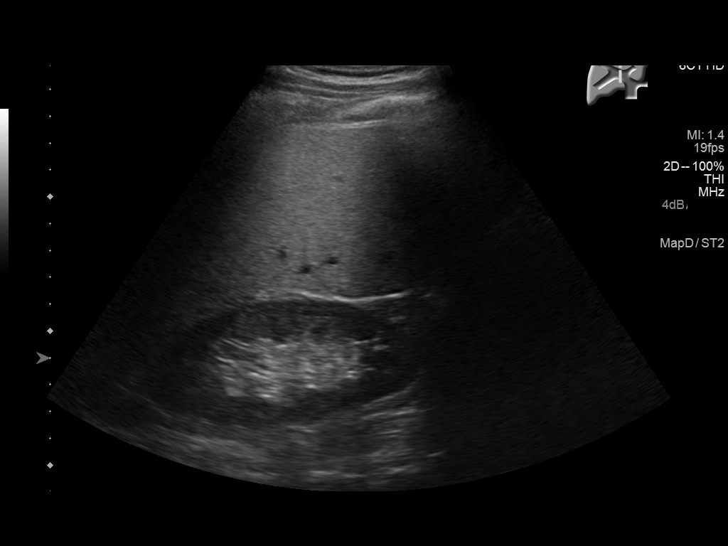
[im 67/146]
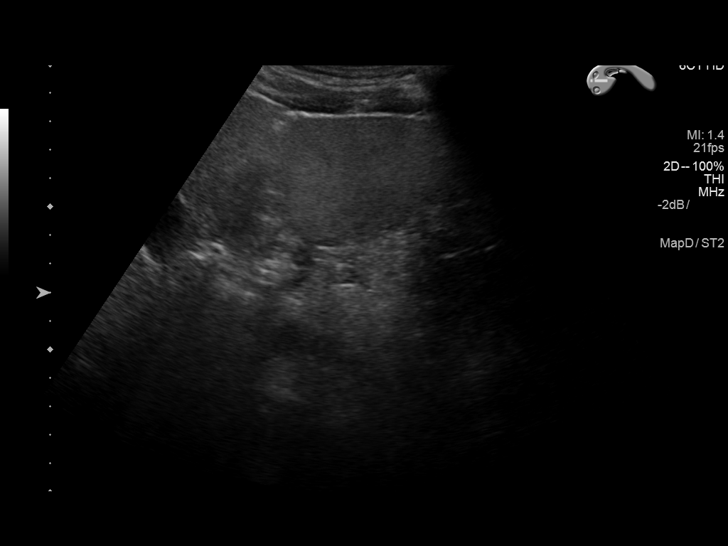
[im 79/146]
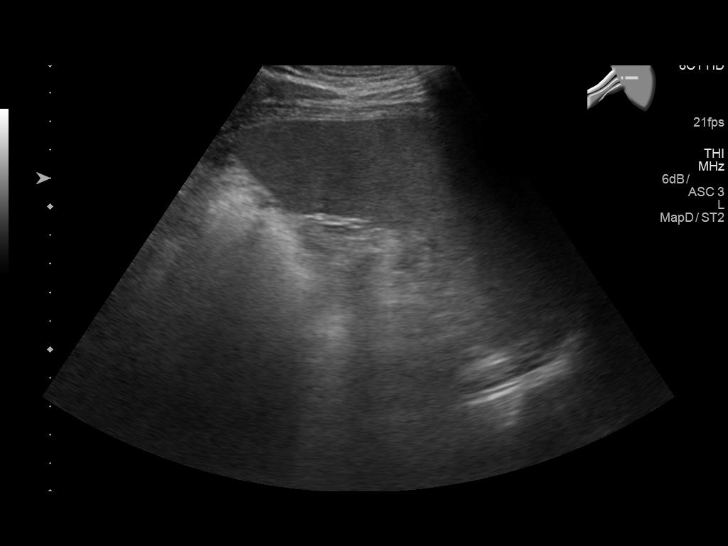
[im 91/146]
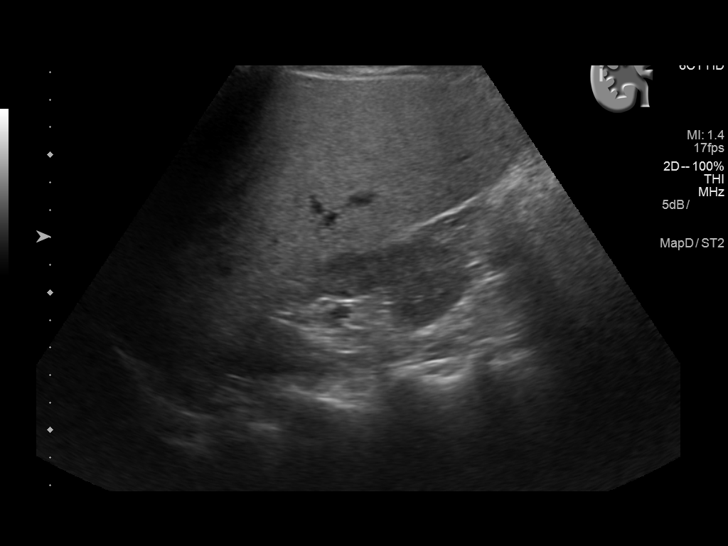
[im 103/146]
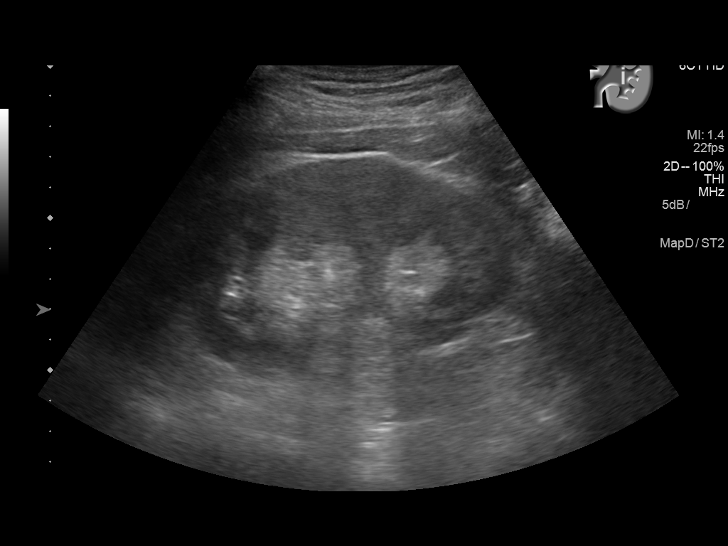
[im 115/146]
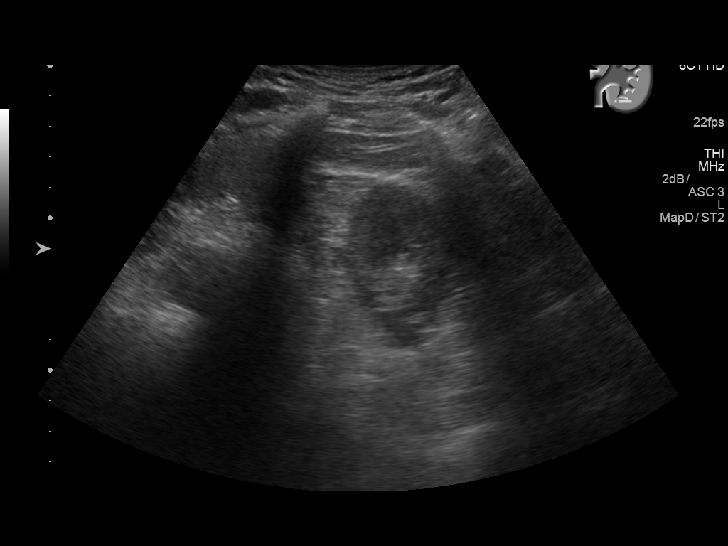
[im 127/146]
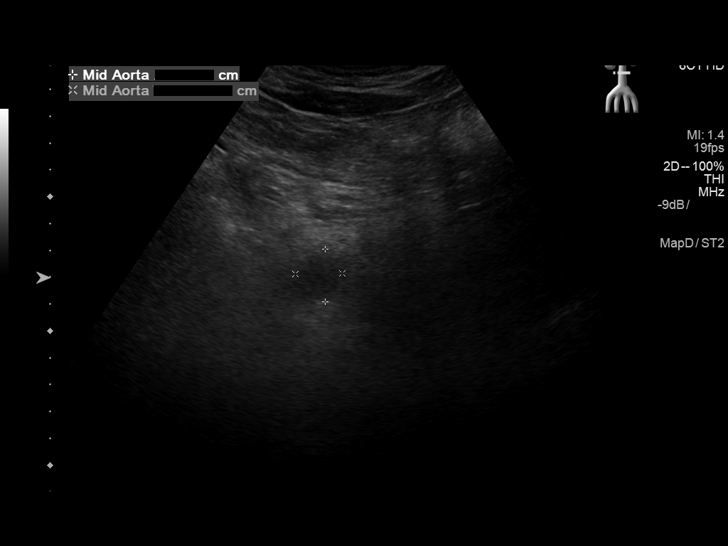
[im 139/146]
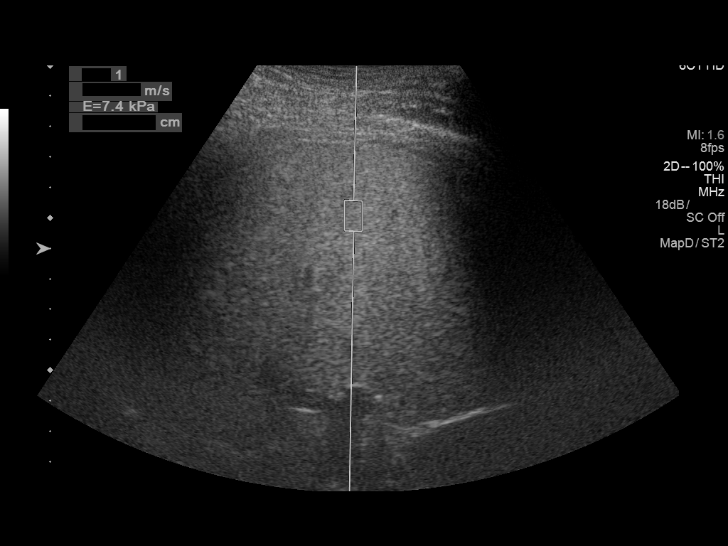

[12 of 25 positions shown; findings below may reference images not displayed]

FINDINGS: ULTRASOUND ABDOMEN

Gallbladder: Dependent sludge in gallbladder. No gallbladder wall
thickening, pericholecystic fluid, gallstones, or sonographic Murphy
sign.

Common bile duct: Diameter: 4 mm diameter, normal

Liver: Echogenic parenchyma, which although can be seen with
cirrhosis, in this patient is most consistent with fatty
infiltration as noted on recent CT. Region of decreased parenchymal
echogenicity is seen within the LEFT lobe of the liver corresponding
to relatively increased attenuation on CT favoring geographic
sparing. No definite focal hepatic mass lesions. Surface contour the
liver appears smooth and regular. Portal vein is patent on color
Doppler imaging with normal direction of blood flow towards the
liver.

IVC: Unremarkable

Pancreas: Small portion of pancreatic body normal appearance,
majority of pancreas obscured by bowel gas

Spleen: Normal appearance, 10.6 cm

Right Kidney: Length: 11.5 cm. Normal morphology without mass or
hydronephrosis.

Left Kidney: Length: 11.4 cm. Normal morphology without mass or
hydronephrosis.

Abdominal aorta: Partially obscured by bowel gas. Visualized portion
normal caliber.

Other findings: No ascites

ULTRASOUND HEPATIC ELASTOGRAPHY

Device: Siemens Helix VTQ

Patient position: Left Lateral Decubitus

Transducer [id number]

Number of measurements: 10

Hepatic segment:  8

Median velocity:   1.58 m/sec

IQR:

IQR/Median velocity ratio:

Corresponding Metavir fibrosis score:  F2 + some F3

Risk of fibrosis: Moderate

Limitations of exam: None

Please note that abnormal shear wave velocities may also be
identified in clinical settings other than with hepatic fibrosis,
such as: acute hepatitis, elevated right heart and central venous
pressures including use of beta blockers, LATIFEE disease
(LATIFEE), infiltrative processes such as
mastocytosis/amyloidosis/infiltrative tumor, extrahepatic
cholestasis, in the post-prandial state, and liver transplantation.
Correlation with patient history, laboratory data, and clinical
condition recommended.
IMPRESSION: ULTRASOUND ABDOMEN:

Probable fatty infiltration of liver with geographic sparing in the
LEFT lobe.

Incomplete visualization of aorta and pancreas.

Sludge within gallbladder.

ULTRASOUND HEPATIC ELASTOGRAPHY:

Median hepatic shear wave velocity is calculated at 1.58 m/sec.

Corresponding Metavir fibrosis score is F2 + some F3.

Risk of fibrosis is Moderate.

Follow-up: Additional testing appropriate

## 2017-08-01 ENCOUNTER — Other Ambulatory Visit: Payer: Self-pay | Admitting: Urology

## 2017-08-01 ENCOUNTER — Telehealth: Payer: Self-pay | Admitting: Urology

## 2017-08-01 MED ORDER — TESTOSTERONE 20.25 MG/1.25GM (1.62%) TD GEL: 2.0000 | Freq: Every morning | TRANSDERMAL | 5 refills | Status: DC

## 2017-08-01 NOTE — Progress Notes (Unsigned)
I have renewed the script for his AndroGel, but it will have to be faxed.

## 2017-08-01 NOTE — Telephone Encounter (Signed)
Patient says that since his pharmacy has closed and his script has been transferred once that he will need a new prescription sent to [facility] in [place]. The one that it was transferred to cost to much. Please call his wife back to let her know that this has been done. It is for his Androgel  Thanks, [employer]

## 2017-08-03 ENCOUNTER — Telehealth: Payer: Self-pay

## 2017-08-03 NOTE — Telephone Encounter (Signed)
PA for androgel APPROVED! 08/03/17-08/03/2018

## 2017-08-26 ENCOUNTER — Encounter: Payer: Self-pay | Admitting: Family Medicine

## 2017-08-26 ENCOUNTER — Ambulatory Visit: Payer: 59 | Admitting: Family Medicine

## 2017-08-26 VITALS — BP 121/79 | HR 78 | Temp 98.5°F | Resp 16 | Ht 65.0 in | Wt 168.0 lb

## 2017-08-26 DIAGNOSIS — J209 Acute bronchitis, unspecified: Secondary | ICD-10-CM

## 2017-08-26 DIAGNOSIS — J440 Chronic obstructive pulmonary disease with acute lower respiratory infection: Secondary | ICD-10-CM

## 2017-08-26 DIAGNOSIS — J410 Simple chronic bronchitis: Secondary | ICD-10-CM

## 2017-08-26 MED ORDER — PREDNISONE 50 MG PO TABS: 50.0000 mg | ORAL_TABLET | Freq: Every day | ORAL | 0 refills | Status: DC

## 2017-08-26 MED ORDER — IPRATROPIUM BROMIDE 0.06 % NA SOLN: 2.0000 | Freq: Four times a day (QID) | NASAL | 0 refills | Status: DC

## 2017-08-26 MED ORDER — AZITHROMYCIN 250 MG PO TABS: ORAL_TABLET | ORAL | 0 refills | Status: DC

## 2017-08-26 NOTE — Progress Notes (Signed)
Subjective:    Patient ID: Brian Wolfe, male    DOB: [birth year], 57 y.o.   MRN: [id number]  Brian Wolfe is a 57 y.o. male presenting on 08/26/2017 for Cough (cough is getting worst, has some chills but no fever, scrathy throat, sinusitis, nasal congestion onset week)  Patient presents for a same day appointment. 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 He recently was involved in the [employer] weather in [place] and [place] he was exposed to cold temperatures and bad weather, and he has developed over past 1 week cough, sore throat tickle in throat, sinus congestion, describes some sputum coming up with cloudy yellowish tint. Symptoms not improving but not necessarily worsening - Tried Robitussin DM liquid regularly over past week, mild to moderate relief - No known sick contact with Flu or other illness - He is active smoker. Prior history of PFTs 03/2016 per [facility] Dr Alva Garnet, he was dx with mild emphysema COPD. Has not returned to pulm, he has Anoro was using in past - Admits occasional chills - Denies any fevers/chills, body aches, nausea vomiting, sinus pain or pressure, ear pain  Health Maintenance: Did not get Flu Shot this season  Depression screen [facility] 2/9 03/08/2017 07/29/2015 01/03/2015  Decreased Interest 2 0 1  Down, Depressed, Hopeless 2 0 1  PHQ - 2 Score 4 0 2  Altered sleeping - - 1  Tired, decreased energy 3 - 3  Change in appetite 2 - 2  Feeling bad or failure about yourself  2 - 1  Trouble concentrating 1 - 0  Moving slowly or fidgety/restless 2 - 0  Suicidal thoughts 0 - 0  PHQ-9 Score - - 9  Difficult doing work/chores Somewhat difficult - Somewhat difficult    Social History   Tobacco Use  . Smoking status: Current Every Day Smoker    Packs/day: 1.00    Years: 40.00    Pack years: 40.00    Types: Cigarettes  . Smokeless tobacco: Never Used    Substance Use Topics  . Alcohol use: No    Alcohol/week: 0.0 oz    Frequency: Never    Comment: rarely  . Drug use: No    Review of Systems Per HPI unless specifically indicated above     Objective:    BP 121/79   Pulse 78   Temp 98.5 F (36.9 C) (Oral)   Resp 16   Ht 5\' 5"  (1.651 m)   Wt 168 lb (76.2 kg)   SpO2 97%   BMI 27.96 kg/m   Wt Readings from Last 3 Encounters:  08/26/17 168 lb (76.2 kg)  03/22/17 167 lb 8 oz (76 kg)  03/09/17 167 lb 14.4 oz (76.2 kg)    Physical Exam  Constitutional: He is oriented to person, place, and time. He appears well-developed and well-nourished. No distress.  Well-appearing, comfortable, cooperative  HENT:  Head: Normocephalic and atraumatic.  Mouth/Throat: Oropharynx is clear and moist.  Frontal / maxillary sinuses non-tender. Nares with some congestion without purulence or edema. Bilateral TMs obscured with cerumen without erythema, effusion or bulging. Oropharynx with mild posterior pharyngeal drainage without erythema, exudates, edema or asymmetry.  Eyes: Conjunctivae are normal. Right eye exhibits no discharge. Left eye exhibits no discharge.  Neck: Normal range of motion. Neck supple.  Cardiovascular: Normal rate, regular rhythm, normal heart sounds and intact distal pulses.  No  murmur heard. Pulmonary/Chest: Effort normal. No respiratory distress. He has no wheezes. He has no rales.  Diffuse mild coarse breath sounds with reduced air movement. Speaks full sentences, rare cough.  Musculoskeletal: Normal range of motion. He exhibits no edema.  Lymphadenopathy:    He has no cervical adenopathy.  Neurological: He is alert and oriented to person, place, and time.  Skin: Skin is warm and dry. No rash noted. He is not diaphoretic. No erythema.  Psychiatric: His behavior is normal.  Nursing note and vitals reviewed.  Results for orders placed or performed in visit on 06/15/17  Hematocrit  Result Value Ref Range   Hematocrit  41.9 37.5 - 51.0 %  Hemoglobin  Result Value Ref Range   Hemoglobin 14.5 13.0 - 17.7 g/dL  Testosterone  Result Value Ref Range   Testosterone 629 264 - 916 ng/dL      Assessment & Plan:   Problem List Items Addressed This Visit    Simple chronic bronchitis (HCC)   Relevant Medications   ipratropium (ATROVENT) 0.06 % nasal spray   azithromycin (ZITHROMAX Z-PAK) 250 MG tablet   predniSONE (DELTASONE) 50 MG tablet    Other Visit Diagnoses    Acute bronchitis with COPD ([place])    -  Primary   Relevant Medications   ipratropium (ATROVENT) 0.06 % nasal spray   azithromycin (ZITHROMAX Z-PAK) 250 MG tablet   predniSONE (DELTASONE) 50 MG tablet      Consistent with mild acute exacerbation of COPD with worsening productive cough - No hypoxia (97% on RA), afebrile, no recent hospitalization - Continues Albuterol, Anoro  Plan: 1. Start Prednisone 50mg  x 5 day steroid burst 2. Start Azithromycin Z-pak - Start Atrovent nasal spray decongestant 2 sprays in each nostril up to 4 times daily for 7 days - Mucinex OTC 3. Use albuterol q 4 hr regularly x 2-3 days. Continue maintenance inhalers, especially when improved - needs to get refill 4. Follow-up 1-2 week if not improving, otherwise strict return criteria to go to ED  Advised may need to return to Pulmonology in future if recurrent episodes.   Meds ordered this encounter  Medications  . ipratropium (ATROVENT) 0.06 % nasal spray    Sig: Place 2 sprays into both nostrils 4 (four) times daily. For up to 5-7 days then stop.    Dispense:  15 mL    Refill:  0  . azithromycin (ZITHROMAX Z-PAK) 250 MG tablet    Sig: Take 2 tabs (500mg  total) on Day 1. Take 1 tab (250mg ) daily for next 4 days.    Dispense:  6 tablet    Refill:  0  . predniSONE (DELTASONE) 50 MG tablet    Sig: Take 1 tablet (50 mg total) by mouth daily with breakfast.    Dispense:  5 tablet    Refill:  0    Follow up plan: Return if symptoms worsen or fail to  improve, for 1- 2 weeks if not improved, COPD.  [name], [facility] 08/27/2017, 12:43 AM

## 2017-08-26 NOTE — Patient Instructions (Addendum)
Thank you for coming to the office today.  1. It sounds like you had an Upper Respiratory Virus that has settled into a Bronchitis, lower respiratory tract infection. I don't have concerns for pneumonia today, and think that this should gradually improve. Once you are feeling better, the cough may take a few weeks to fully resolve. I do hear wheezing and coarse breath sounds, this may be due to the virus, also could be related to smoking.  - Start Azithromycin Z pak (antibiotic) 2 tabs day 1, then 1 tab x 4 days, complete entire course even if improved  - Start Prednisone 50mg  daily for next 5 days - this will open up lungs allow you to breath better and treat that wheezing or bronchospasm  Start Atrovent nasal spray 2 sprays in each nostril up to max 3-4 times a day up to 3-4 days for nasal congestion, if you use for longer than this it may no longer be effective, stop using at 1 week.  - Use Albuterol inhaler 2 puffs every 4-6 hours around the clock for next 2-3 days  - When feeling better, please resume the Anoro  - Start OTC Mucinex for 1 week or less, to help clear the mucus - Use nasal saline (Simply Saline or Ocean Spray) to flush nasal congestion multiple times a day, may help cough - Drink plenty of fluids to improve congestion  If your symptoms seem to worsen instead of improve over next several days, including significant fever / chills, worsening shortness of breath, worsening wheezing, or nausea / vomiting and can't take medicines - return sooner or go to hospital Emergency Department for more immediate treatment.   Please schedule a Follow-up Appointment to: Return if symptoms worsen or fail to improve, for 1- 2 weeks if not improved, COPD.  If you have any other questions or concerns, please feel free to call the office or send a message through [place]. You may also schedule an earlier appointment if necessary.  Additionally, you may be receiving a survey about your  experience at our office within a few days to 1 week by e-mail or mail. We value your feedback.  [name], DO [place]

## 2017-08-27 ENCOUNTER — Encounter: Payer: Self-pay | Admitting: Family Medicine

## 2017-08-28 ENCOUNTER — Encounter: Payer: Self-pay | Admitting: Family Medicine

## 2017-08-30 ENCOUNTER — Encounter: Payer: Self-pay | Admitting: Nurse Practitioner

## 2017-08-30 ENCOUNTER — Other Ambulatory Visit: Payer: Self-pay

## 2017-08-30 ENCOUNTER — Ambulatory Visit: Payer: 59 | Admitting: Nurse Practitioner

## 2017-08-30 VITALS — BP 120/70 | HR 71 | Temp 98.3°F | Resp 18 | Ht 65.0 in | Wt 166.2 lb

## 2017-08-30 DIAGNOSIS — R05 Cough: Secondary | ICD-10-CM | POA: Diagnosis not present

## 2017-08-30 DIAGNOSIS — R059 Cough, unspecified: Secondary | ICD-10-CM

## 2017-08-30 DIAGNOSIS — J410 Simple chronic bronchitis: Secondary | ICD-10-CM | POA: Diagnosis not present

## 2017-08-30 MED ORDER — BENZONATATE 100 MG PO CAPS: 100.0000 mg | ORAL_CAPSULE | Freq: Three times a day (TID) | ORAL | 0 refills | Status: AC | PRN

## 2017-08-30 MED ORDER — BENZONATATE 100 MG PO CAPS: 100.0000 mg | ORAL_CAPSULE | Freq: Two times a day (BID) | ORAL | 0 refills | Status: DC | PRN

## 2017-08-30 MED ORDER — UMECLIDINIUM-VILANTEROL 62.5-25 MCG/INH IN AEPB: INHALATION_SPRAY | RESPIRATORY_TRACT | 5 refills | Status: DC

## 2017-08-30 NOTE — Progress Notes (Signed)
Subjective:    Patient ID: Brian Wolfe, male    DOB: 04/15/1961, 57 y.o.   MRN: [id number]  Brian Wolfe is a 58 y.o. male presenting on 08/30/2017 for Cough (chest congestion, coghing that worse at night with lying down )   HPI Cough Pt was treated for acute bronchitis w/ COPD by Dr. Parks Ranger on 08/26/2017.  He has completed prednisone, azithromycin and continues on expectorant.  He has had some relief of congestion, but continues to have bothersome cough that has prevented sleep.  Pt states he has had about 6 hrs of sleep in last 3 days combined.  Cough is dry without congestion, nasal drainage, and chest tightness.  Pt notes cough is worst when lying down.  He is currently taking 12 hr expectorant and delsym without significant relief and no mobilization of secretions.  - Pt denies fever, chills, sweats, nausea, vomiting, diarrhea and constipation.  Social History   Tobacco Use  . Smoking status: Current Every Day Smoker    Packs/day: 1.00    Years: 40.00    Pack years: 40.00    Types: Cigarettes  . Smokeless tobacco: Never Used  Substance Use Topics  . Alcohol use: No    Alcohol/week: 0.0 oz    Frequency: Never    Comment: rarely  . Drug use: No    Review of Systems Per HPI unless specifically indicated above     Objective:    BP 120/70 (BP Location: Right Arm, Patient Position: Sitting, Cuff Size: Normal)   Pulse 71   Temp 98.3 F (36.8 C) (Oral)   Resp 18   Ht 5\' 5"  (1.651 m)   Wt 166 lb 3.2 oz (75.4 kg)   SpO2 98%   BMI 27.66 kg/m   Wt Readings from Last 3 Encounters:  08/30/17 166 lb 3.2 oz (75.4 kg)  08/26/17 168 lb (76.2 kg)  03/22/17 167 lb 8 oz (76 kg)    Physical Exam  General - overweight, well-appearing, NAD HEENT - Normocephalic, atraumatic, PERRL, EOMI, patent nares w/ mild congestion, oropharynx mildly injected and with mild cobblestoning, MMM, TM normal, Ear canal normal, external ear normal w/o lesions Neck - supple, non-tender, mild  lower anterior cervical chain LAD Heart - RRR, no murmurs heard Lungs - Diminished air movement throughout all lobes and mild popping of airway opening in bilateral lower lobes.  No wheezing, crackles, or rhonchi. Normal work of breathing. Extremeties - non-tender, no edema, cap refill < 2 seconds, peripheral pulses intact +2 bilaterally Skin - warm, dry, no rashes Neuro - awake, alert, oriented x3, normal gait Psych - Normal mood and affect, normal behavior    Results for orders placed or performed in visit on 06/15/17  Hematocrit  Result Value Ref Range   Hematocrit 41.9 37.5 - 51.0 %  Hemoglobin  Result Value Ref Range   Hemoglobin 14.5 13.0 - 17.7 g/dL  Testosterone  Result Value Ref Range   Testosterone 629 264 - 916 ng/dL      Assessment & Plan:   Problem List Items Addressed This Visit      Respiratory   Simple chronic bronchitis (HCC)   Relevant Medications   umeclidinium-vilanterol (ANORO ELLIPTA) 62.5-25 MCG/INH AEPB    Other Visit Diagnoses    Cough    -  Primary   Relevant Medications   benzonatate (TESSALON) 100 MG capsule    Consistent with improving bronchitis 2/2 mild acute exacerbation of COPD with worsening productive cough. - No hypoxia (  98% on RA), afebrile, no recent hospitalization - Continues Albuterol.  Has not yet resumed his Anoro  Plan: 1. Start benzonatate 100 mg tid prn for cough.  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 2. Use albuterol q 4 hr regularly x next 1-2 days. Resume maintenance inhalers. 3. Repeat antibiotics not indicated today. 4. RTC about 1 week if not improving, otherwise strict return criteria to go to ED   Meds ordered this encounter  Medications  . DISCONTD: benzonatate (TESSALON) 100 MG capsule    Sig: Take 1 capsule (100 mg total) by mouth 2 (two) times daily as needed for cough.    Dispense:  20 capsule    Refill:  0    Order Specific Question:   Supervising Provider     Answer:   Olin Hauser [[id number]]  . benzonatate (TESSALON) 100 MG capsule    Sig: Take 1 capsule (100 mg total) by mouth 3 (three) times daily as needed for up to 10 days for cough.    Dispense:  30 capsule    Refill:  0    Please cancel the previous prescription for bid.    Order Specific Question:   Supervising Provider    Answer:   Olin Hauser [[id number]]  . umeclidinium-vilanterol (ANORO ELLIPTA) 62.5-25 MCG/INH AEPB    Sig: INHALE ONE PUFF BY MOUTH DAILY.    Dispense:  1 each    Refill:  5    Order Specific Question:   Supervising Provider    Answer:   Olin Hauser [[id number]]   Follow up plan: Return 5-7 days if symptoms worsen or fail to improve, for  AND in April for COPD.   Cassell Smiles, DNP, AGPCNP-BC Adult Gerontology Primary Care Nurse Practitioner [facility] 08/30/2017, 5:56 PM

## 2017-08-30 NOTE — Patient Instructions (Addendum)
Brian Wolfe,  Thank you for coming in to clinic today.  1. Continue albuterol regularly about every 6 hours for another 1-2 days.  2. START benzonatate 100 mg tablet.  Take one three times daily as needed for cough.  3. START anoro one inhaler once daily.  Please schedule a follow-up appointment with Brian Wolfe, AGNP. Return 5-7 days if symptoms worsen or fail to improve.  If you have any other questions or concerns, please feel free to call the clinic or send a message through [place]. You may also schedule an earlier appointment if necessary.  You will receive a survey after today's visit either digitally by e-mail or paper by [employer]. Your experiences and feedback matter to [place].  Please respond so we know how we are doing as we provide care for you.   Brian Smiles, DNP, AGNP-BC Adult Gerontology Nurse Practitioner [place]

## 2017-08-31 ENCOUNTER — Encounter: Payer: Self-pay | Admitting: Nurse Practitioner

## 2017-09-17 ENCOUNTER — Other Ambulatory Visit: Payer: Self-pay | Admitting: Nurse Practitioner

## 2017-09-17 DIAGNOSIS — K219 Gastro-esophageal reflux disease without esophagitis: Secondary | ICD-10-CM

## 2017-09-20 ENCOUNTER — Other Ambulatory Visit: Payer: Self-pay

## 2017-09-20 ENCOUNTER — Telehealth: Payer: Self-pay | Admitting: Pulmonary Disease

## 2017-09-20 ENCOUNTER — Encounter: Payer: Self-pay | Admitting: Pulmonary Disease

## 2017-09-20 DIAGNOSIS — F329 Major depressive disorder, single episode, unspecified: Secondary | ICD-10-CM

## 2017-09-20 DIAGNOSIS — F419 Anxiety disorder, unspecified: Principal | ICD-10-CM

## 2017-09-20 MED ORDER — ESCITALOPRAM OXALATE 10 MG PO TABS: 10.0000 mg | ORAL_TABLET | Freq: Every day | ORAL | 1 refills | Status: DC

## 2017-09-20 NOTE — Telephone Encounter (Signed)
3 attempts to schedule fu appt from recall list.  Mailed Letter. Deleting recall.  ° °

## 2017-10-10 ENCOUNTER — Other Ambulatory Visit: Payer: 59

## 2017-10-10 DIAGNOSIS — E291 Testicular hypofunction: Secondary | ICD-10-CM

## 2017-10-11 LAB — TESTOSTERONE: TESTOSTERONE: 335 ng/dL (ref 264–916)

## 2017-10-11 LAB — PSA: Prostate Specific Ag, Serum: 3.8 ng/mL (ref 0.0–4.0)

## 2017-10-11 LAB — HEMOGLOBIN: Hemoglobin: 14.2 g/dL (ref 13.0–17.7)

## 2017-10-11 LAB — HEMATOCRIT: Hematocrit: 42.7 % (ref 37.5–51.0)

## 2017-10-12 ENCOUNTER — Encounter: Payer: Self-pay | Admitting: Urology

## 2017-10-12 ENCOUNTER — Ambulatory Visit: Payer: 59 | Admitting: Urology

## 2017-10-12 ENCOUNTER — Ambulatory Visit (INDEPENDENT_AMBULATORY_CARE_PROVIDER_SITE_OTHER): Payer: 59 | Admitting: Urology

## 2017-10-12 VITALS — BP 136/78 | HR 71 | Ht 65.0 in | Wt 167.0 lb

## 2017-10-12 DIAGNOSIS — E291 Testicular hypofunction: Secondary | ICD-10-CM | POA: Diagnosis not present

## 2017-10-12 DIAGNOSIS — N529 Male erectile dysfunction, unspecified: Secondary | ICD-10-CM | POA: Diagnosis not present

## 2017-10-12 DIAGNOSIS — R972 Elevated prostate specific antigen [PSA]: Secondary | ICD-10-CM | POA: Diagnosis not present

## 2017-10-12 MED ORDER — TESTOSTERONE 20.25 MG/1.25GM (1.62%) TD GEL: 2.0000 | Freq: Every morning | TRANSDERMAL | 5 refills | Status: DC
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
## 2017-10-18 ENCOUNTER — Encounter: Payer: Self-pay | Admitting: Urology

## 2018-01-03 ENCOUNTER — Encounter: Payer: Self-pay | Admitting: Emergency Medicine

## 2018-01-03 ENCOUNTER — Emergency Department: Payer: 59

## 2018-01-03 ENCOUNTER — Other Ambulatory Visit: Payer: Self-pay

## 2018-01-03 ENCOUNTER — Emergency Department
Admission: EM | Admit: 2018-01-03 | Discharge: 2018-01-03 | Disposition: A | Discharge status: Home / Self Care | Payer: 59 | Source: Home / Self Care

## 2018-01-03 DIAGNOSIS — L03314 Cellulitis of groin: Secondary | ICD-10-CM

## 2018-01-03 DIAGNOSIS — Z79899 Other long term (current) drug therapy: Secondary | ICD-10-CM | POA: Insufficient documentation

## 2018-01-03 DIAGNOSIS — F17210 Nicotine dependence, cigarettes, uncomplicated: Secondary | ICD-10-CM

## 2018-01-03 DIAGNOSIS — L03818 Cellulitis of other sites: Secondary | ICD-10-CM

## 2018-01-03 LAB — CBC
HCT: 39.1 % — ABNORMAL LOW (ref 40.0–52.0)
HEMOGLOBIN: 13.7 g/dL (ref 13.0–18.0)
MCH: 32.7 pg (ref 26.0–34.0)
MCHC: 35 g/dL (ref 32.0–36.0)
MCV: 93.4 fL (ref 80.0–100.0)
PLATELETS: 181 10*3/uL (ref 150–440)
RBC: 4.18 MIL/uL — ABNORMAL LOW (ref 4.40–5.90)
RDW: 13.1 % (ref 11.5–14.5)
WBC: 11.9 10*3/uL — ABNORMAL HIGH (ref 3.8–10.6)

## 2018-01-03 LAB — COMPREHENSIVE METABOLIC PANEL
ALT: 19 U/L (ref 0–44)
ANION GAP: 7 (ref 5–15)
AST: 22 U/L (ref 15–41)
Albumin: 4 g/dL (ref 3.5–5.0)
Alkaline Phosphatase: 79 U/L (ref 38–126)
BUN: 9 mg/dL (ref 6–20)
CHLORIDE: 106 mmol/L (ref 98–111)
CO2: 26 mmol/L (ref 22–32)
Calcium: 9.3 mg/dL (ref 8.9–10.3)
Creatinine, Ser: 0.73 mg/dL (ref 0.61–1.24)
GFR calc non Af Amer: >60 mL/min (ref >60)
Glucose, Bld: 71 mg/dL (ref 70–99)
POTASSIUM: 4.3 mmol/L (ref 3.5–5.1)
Sodium: 139 mmol/L (ref 135–145)
Total Bilirubin: 1.4 mg/dL — ABNORMAL HIGH (ref 0.3–1.2)
Total Protein: 7.6 g/dL (ref 6.5–8.1)

## 2018-01-03 IMAGING — CT CT ABD-PELV W/ CM
2 of 5 series · 14 of 46 positions shown, 16 images · IV contrast (APPLIED)
Comparison: None.

CLINICAL DATA: Left inguinal pain and swelling.

EXAM:
CT ABDOMEN AND PELVIS WITH CONTRAST
TECHNIQUE: Multidetector CT imaging of the abdomen and pelvis was performed
using the standard protocol following bolus administration of
intravenous contrast.
CONTRAST:  100mL OMNIPAQUE IOHEXOL 300 MG/ML  SOLN

[Series 2: axial st · axial · 0.66mm/px · z∈[-1185,-695]mm · 11 of 110 slices shown, 13 images]
[im 6/110  soft-tissue]
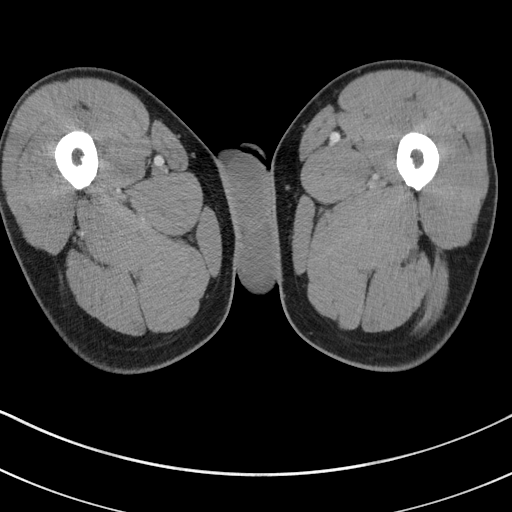
[im 6/110  bone]
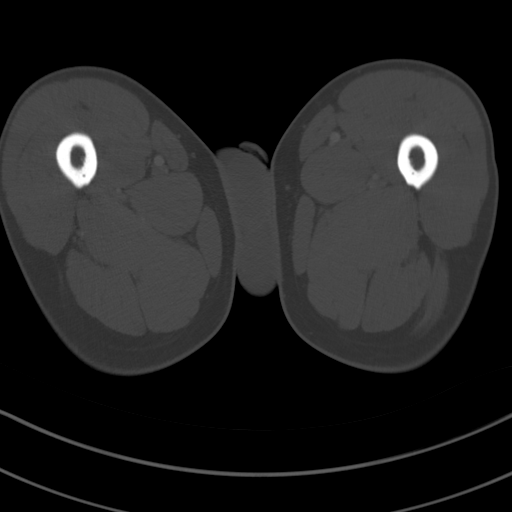
[im 18/110  soft-tissue]
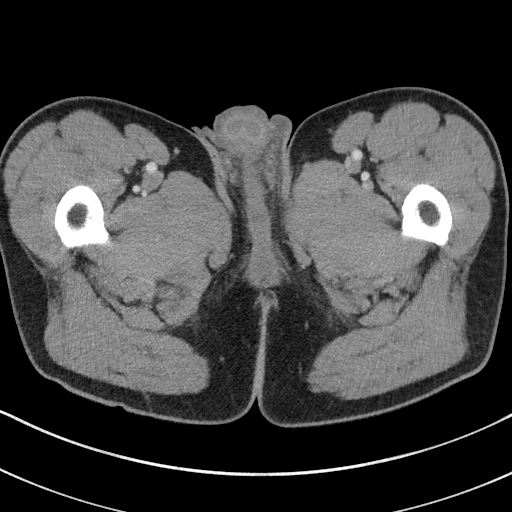
[im 29/110  soft-tissue]
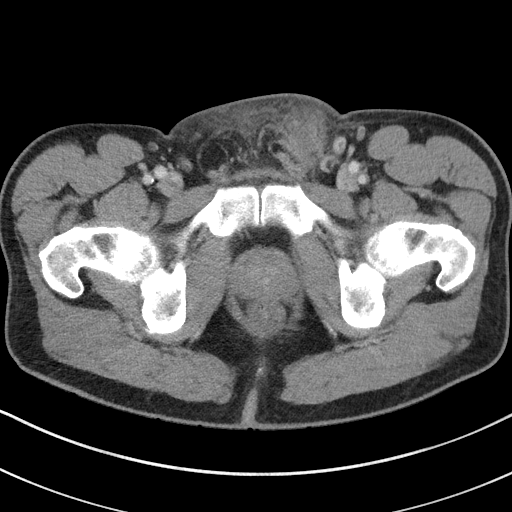
[im 35/110  soft-tissue]
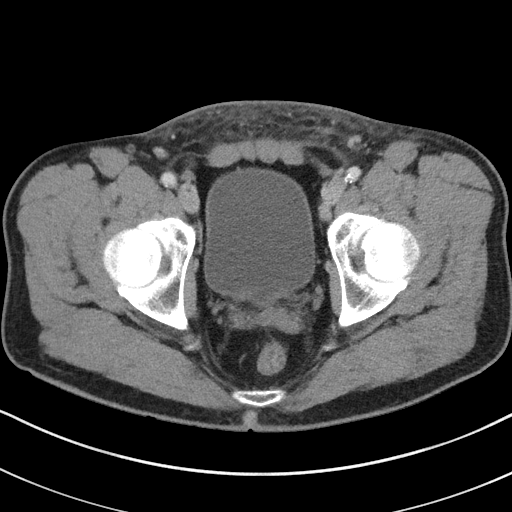
[im 46/110  soft-tissue]
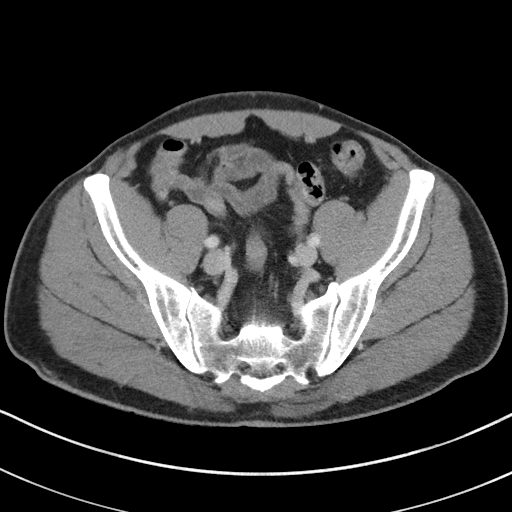
[im 58/110  soft-tissue]
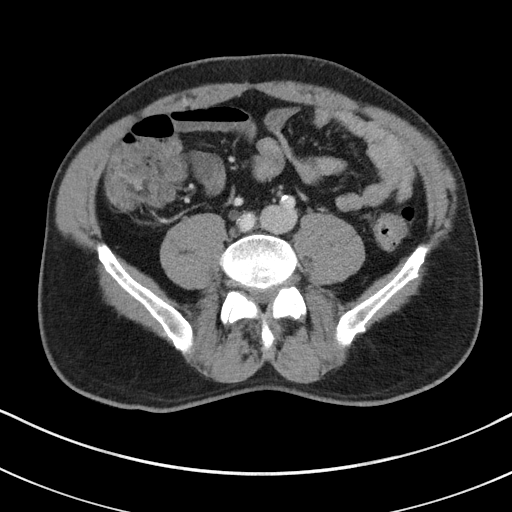
[im 64/110  soft-tissue]
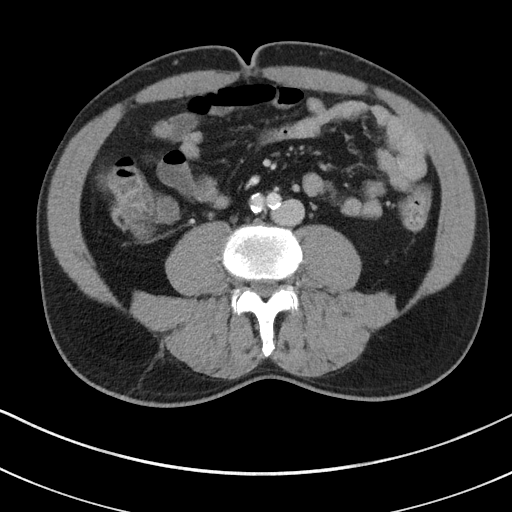
[im 75/110  soft-tissue]
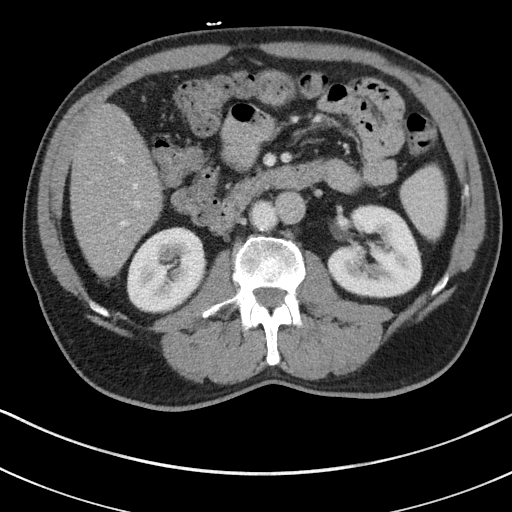
[im 81/110  soft-tissue]
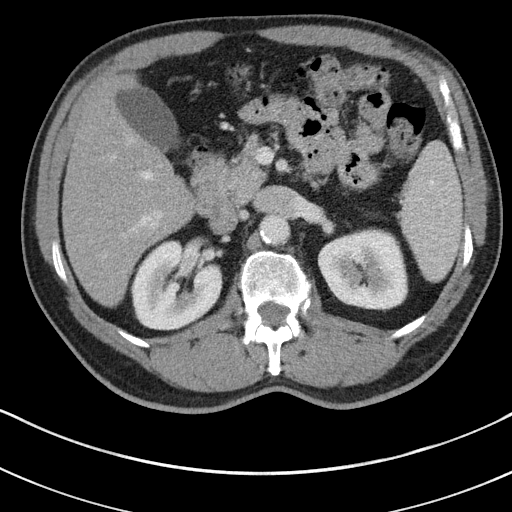
[im 81/110  bone]
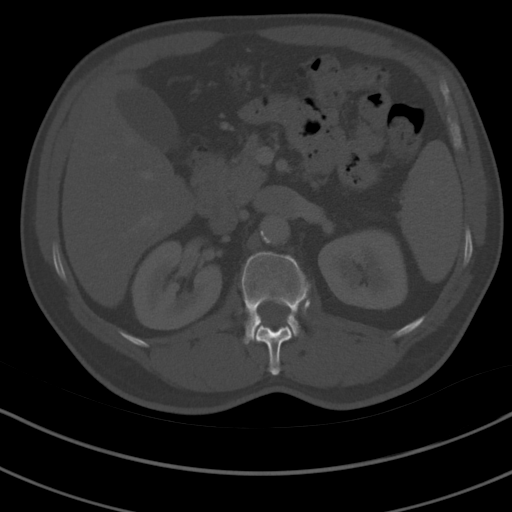
[im 92/110  soft-tissue]
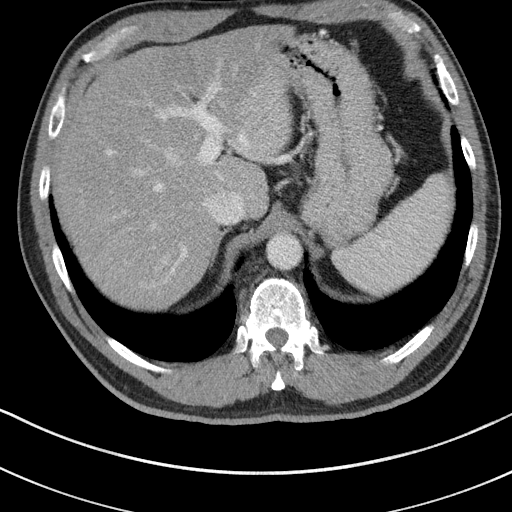
[im 104/110  soft-tissue]
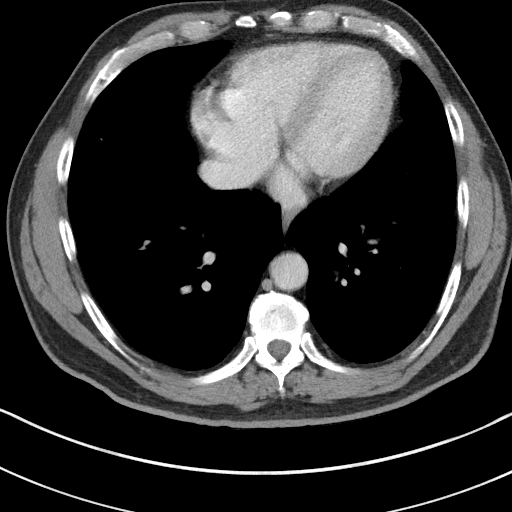

[Series 5: coronal st · coronal · 0.71mm/px · 3 of 97 slices shown]
[im 33/97  soft-tissue]
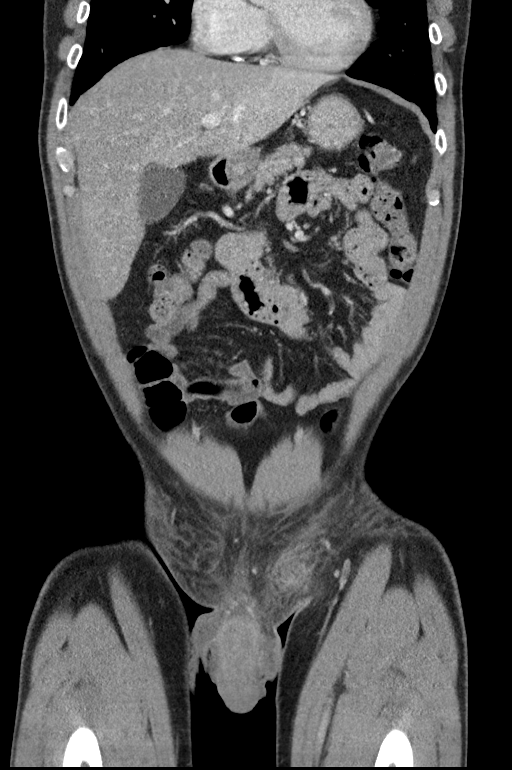
[im 43/97  soft-tissue]
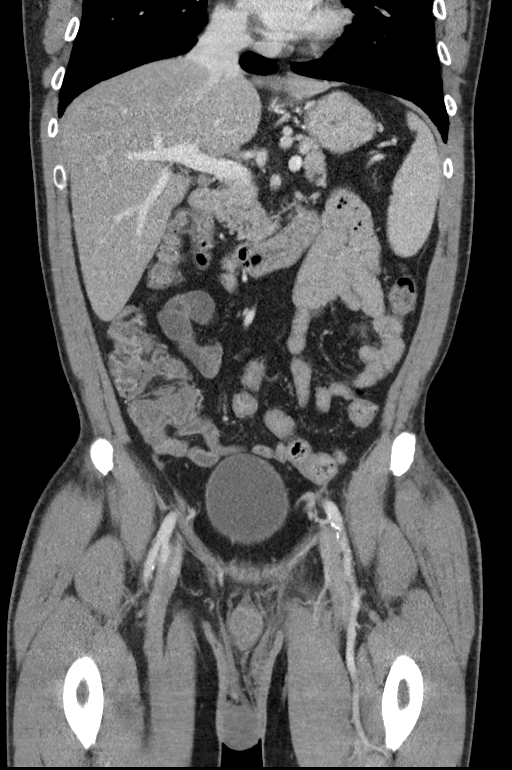
[im 54/97  soft-tissue]
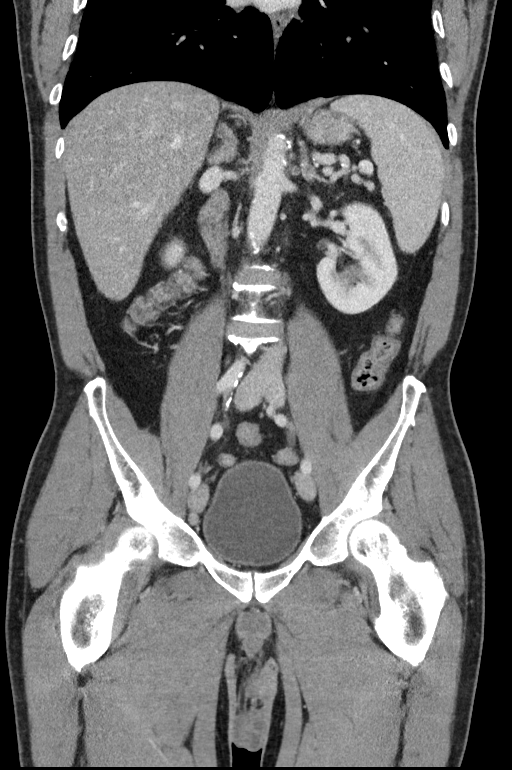

[14 of 46 positions shown; findings below may reference images not displayed]

FINDINGS: Lower chest: Stable 5.5 mm subpleural pulmonary nodule in the
lingula since prior study from [NM]. No worrisome pulmonary lesions
or acute pulmonary findings. Stable emphysematous changes. The heart
is normal in size. Coronary artery calcifications are noted.

Hepatobiliary: No focal hepatic lesions or intrahepatic biliary
dilatation. Mild diffuse fatty infiltration of the liver. Slight
irregularity of the hepatic surface and slight increased caudate to
right lobe ratio may suggest mild changes of cirrhosis. There are
also perigastric and perisplenic collateral vessels suggesting
portal venous hypertension. The gallbladder appears normal. No
common bile duct dilatation.

Pancreas: No mass, inflammation or ductal dilatation.

Spleen: Upper limits of normal in size measuring 11.5 x 10.5 x
cm. No focal lesions.

Adrenals/Urinary Tract: The adrenal glands and kidneys are
unremarkable. No renal, ureteral or bladder calculi or mass.

Stomach/Bowel: The stomach, duodenum, small bowel and colon are
unremarkable. No acute inflammatory changes, mass lesions or
obstructive findings. The terminal ileum and appendix are normal.

Vascular/Lymphatic: Age advanced atherosclerotic calcifications
involving the aorta and iliac arteries. No aneurysm or dissection.
The branch vessels are patent. The major venous structures are
patent. Incidental left-sided IVC. No mesenteric or retroperitoneal
mass or adenopathy.

Reproductive: The prostate gland and seminal vesicles are
unremarkable.

Other: Diffuse subcutaneous soft tissue swelling/edema/fluid
suggesting cellulitis in the pubic and suprapubic region. More focal
inflammatory phlegmon noted in the left inguinal area adjacent to
the left inguinal canal. I do not see a discrete drainable rim
enhancing fluid collection. Some cellulitic changes also involving
the base of the penis. Small scattered inguinal lymph nodes.

Musculoskeletal: No significant bony findings.
IMPRESSION: 1. Cellulitis and more focal area of phlegmonous inflammatory
changes in the left groin area without discrete drainable abscess.
2. No acute intra-abdominal/pelvic inflammatory process or mass
lesion or adenopathy.
3. Suspect mild/early cirrhotic changes involving the liver with
portal venous hypertension and portal venous collaterals. No ascites
and borderline splenic size.
4. Mild diffuse fatty infiltration of the liver.
5. Age advanced atherosclerotic calcifications involving the
coronary arteries and abdominal aorta and iliac arteries.

## 2018-01-03 MED ORDER — CLINDAMYCIN PHOSPHATE 600 MG/50ML IV SOLN
600.0000 mg | Freq: Once | INTRAVENOUS | Status: AC
  Administered 2018-01-03: 600 mg via INTRAVENOUS
  Filled 2018-01-03: qty 50

## 2018-01-03 MED ORDER — CLINDAMYCIN HCL 300 MG PO CAPS: 300.0000 mg | ORAL_CAPSULE | Freq: Four times a day (QID) | ORAL | 0 refills | Status: AC

## 2018-01-03 MED ORDER — IOHEXOL 300 MG/ML  SOLN
100.0000 mL | Freq: Once | INTRAMUSCULAR | Status: AC | PRN
  Administered 2018-01-03: 100 mL via INTRAVENOUS
  Filled 2018-01-03: qty 100

## 2018-01-03 MED ORDER — OXYCODONE-ACETAMINOPHEN 5-325 MG PO TABS
ORAL_TABLET | ORAL | Status: AC
  Filled 2018-01-03: qty 1

## 2018-01-03 MED ORDER — OXYCODONE-ACETAMINOPHEN 5-325 MG PO TABS
1.0000 | ORAL_TABLET | Freq: Once | ORAL | Status: AC
Start: 2018-01-03 — End: 2018-01-03
  Administered 2018-01-03: 1 via ORAL

## 2018-01-03 NOTE — ED Triage Notes (Signed)
Pt reports that he noticed a boil on his left groin area, he states that it started out marble size and know it is the size of an egg.
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
## 2018-01-03 NOTE — ED Notes (Signed)
See triage note. States he developed a small area to groin couple of days ago  Now states area in now spreading across abd

## 2018-01-04 ENCOUNTER — Inpatient Hospital Stay
Admission: EM | Admit: 2018-01-04 | Discharge: 2018-01-07 | DRG: 603 | Disposition: A | Discharge status: Home / Self Care | Payer: 59 | Attending: Internal Medicine | Admitting: Internal Medicine

## 2018-01-04 ENCOUNTER — Other Ambulatory Visit: Payer: Self-pay

## 2018-01-04 ENCOUNTER — Encounter: Payer: Self-pay | Admitting: *Deleted

## 2018-01-04 DIAGNOSIS — L03314 Cellulitis of groin: Secondary | ICD-10-CM | POA: Diagnosis present

## 2018-01-04 DIAGNOSIS — Z800 Family history of malignant neoplasm of digestive organs: Secondary | ICD-10-CM | POA: Diagnosis not present

## 2018-01-04 DIAGNOSIS — F329 Major depressive disorder, single episode, unspecified: Secondary | ICD-10-CM | POA: Diagnosis present

## 2018-01-04 DIAGNOSIS — K219 Gastro-esophageal reflux disease without esophagitis: Secondary | ICD-10-CM | POA: Diagnosis present

## 2018-01-04 DIAGNOSIS — E291 Testicular hypofunction: Secondary | ICD-10-CM | POA: Diagnosis present

## 2018-01-04 DIAGNOSIS — Z801 Family history of malignant neoplasm of trachea, bronchus and lung: Secondary | ICD-10-CM

## 2018-01-04 DIAGNOSIS — Z8249 Family history of ischemic heart disease and other diseases of the circulatory system: Secondary | ICD-10-CM | POA: Diagnosis not present

## 2018-01-04 DIAGNOSIS — E785 Hyperlipidemia, unspecified: Secondary | ICD-10-CM | POA: Diagnosis present

## 2018-01-04 DIAGNOSIS — L0390 Cellulitis, unspecified: Secondary | ICD-10-CM | POA: Diagnosis present

## 2018-01-04 DIAGNOSIS — Z8042 Family history of malignant neoplasm of prostate: Secondary | ICD-10-CM

## 2018-01-04 DIAGNOSIS — F17210 Nicotine dependence, cigarettes, uncomplicated: Secondary | ICD-10-CM | POA: Diagnosis present

## 2018-01-04 DIAGNOSIS — Z833 Family history of diabetes mellitus: Secondary | ICD-10-CM

## 2018-01-04 DIAGNOSIS — Z841 Family history of disorders of kidney and ureter: Secondary | ICD-10-CM | POA: Diagnosis not present

## 2018-01-04 LAB — CBC WITH DIFFERENTIAL/PLATELET
Basophils Absolute: 0 10*3/uL (ref 0–0.1)
Basophils Relative: 0 %
Eosinophils Absolute: 0.3 10*3/uL (ref 0–0.7)
Eosinophils Relative: 2 %
HCT: 39.6 % — ABNORMAL LOW (ref 40.0–52.0)
Hemoglobin: 13.9 g/dL (ref 13.0–18.0)
Lymphocytes Relative: 25 %
Lymphs Abs: 2.9 10*3/uL (ref 1.0–3.6)
MCH: 33 pg (ref 26.0–34.0)
MCHC: 35.2 g/dL (ref 32.0–36.0)
MCV: 93.8 fL (ref 80.0–100.0)
Monocytes Absolute: 0.9 10*3/uL (ref 0.2–1.0)
Monocytes Relative: 8 %
Neutro Abs: 7.5 10*3/uL — ABNORMAL HIGH (ref 1.4–6.5)
Neutrophils Relative %: 65 %
Platelets: 179 10*3/uL (ref 150–440)
RBC: 4.22 MIL/uL — ABNORMAL LOW (ref 4.40–5.90)
RDW: 13 % (ref 11.5–14.5)
WBC: 11.6 10*3/uL — ABNORMAL HIGH (ref 3.8–10.6)

## 2018-01-04 LAB — BASIC METABOLIC PANEL
Anion gap: 8 (ref 5–15)
BUN: 15 mg/dL (ref 6–20)
CO2: 24 mmol/L (ref 22–32)
Calcium: 9 mg/dL (ref 8.9–10.3)
Chloride: 107 mmol/L (ref 98–111)
Creatinine, Ser: 0.84 mg/dL (ref 0.61–1.24)
GFR calc Af Amer: >60 mL/min (ref >60)
GFR calc non Af Amer: >60 mL/min (ref >60)
Glucose, Bld: 130 mg/dL — ABNORMAL HIGH (ref 70–99)
Potassium: 3.9 mmol/L (ref 3.5–5.1)
Sodium: 139 mmol/L (ref 135–145)

## 2018-01-04 LAB — URINALYSIS, COMPLETE (UACMP) WITH MICROSCOPIC
Bacteria, UA: NONE SEEN
Bilirubin Urine: NEGATIVE
Glucose, UA: NEGATIVE mg/dL
Hgb urine dipstick: NEGATIVE
Ketones, ur: NEGATIVE mg/dL
Leukocytes, UA: NEGATIVE
Nitrite: NEGATIVE
Protein, ur: NEGATIVE mg/dL
Specific Gravity, Urine: 1.01 (ref 1.005–1.030)
Squamous Epithelial / HPF: NONE SEEN (ref 0–5)
pH: 7 (ref 5.0–8.0)

## 2018-01-04 MED ORDER — HYDROCODONE-ACETAMINOPHEN 5-325 MG PO TABS
1.0000 | ORAL_TABLET | ORAL | Status: DC | PRN
  Administered 2018-01-05 (×3): 2 via ORAL
  Administered 2018-01-05: 1 via ORAL
  Filled 2018-01-04: qty 2
  Filled 2018-01-04: qty 1
  Filled 2018-01-04 (×2): qty 2

## 2018-01-04 MED ORDER — PIPERACILLIN-TAZOBACTAM 3.375 G IVPB 30 MIN
3.3750 g | Freq: Once | INTRAVENOUS | Status: AC
  Administered 2018-01-04: 3.375 g via INTRAVENOUS
  Filled 2018-01-04: qty 50

## 2018-01-04 MED ORDER — ENOXAPARIN SODIUM 40 MG/0.4ML ~~LOC~~ SOLN
40.0000 mg | SUBCUTANEOUS | Status: DC
  Administered 2018-01-04 – 2018-01-06 (×3): 40 mg via SUBCUTANEOUS
  Filled 2018-01-04 (×3): qty 0.4

## 2018-01-04 MED ORDER — SODIUM CHLORIDE 0.9% FLUSH: 3.0000 mL | INTRAVENOUS | Status: DC | PRN

## 2018-01-04 MED ORDER — ONDANSETRON HCL 4 MG/2ML IJ SOLN: 4.0000 mg | Freq: Four times a day (QID) | INTRAMUSCULAR | Status: DC | PRN

## 2018-01-04 MED ORDER — PIPERACILLIN-TAZOBACTAM 3.375 G IVPB
3.3750 g | Freq: Three times a day (TID) | INTRAVENOUS | Status: DC
  Administered 2018-01-04 – 2018-01-07 (×8): 3.375 g via INTRAVENOUS
  Filled 2018-01-04 (×8): qty 50

## 2018-01-04 MED ORDER — COENZYME Q10 30 MG PO CAPS: 30.0000 mg | ORAL_CAPSULE | Freq: Every day | ORAL | Status: DC

## 2018-01-04 MED ORDER — ONDANSETRON HCL 4 MG PO TABS: 4.0000 mg | ORAL_TABLET | Freq: Four times a day (QID) | ORAL | Status: DC | PRN

## 2018-01-04 MED ORDER — PANTOPRAZOLE SODIUM 40 MG PO TBEC
40.0000 mg | DELAYED_RELEASE_TABLET | Freq: Every day | ORAL | Status: DC
  Administered 2018-01-05 – 2018-01-07 (×3): 40 mg via ORAL
  Filled 2018-01-04 (×3): qty 1

## 2018-01-04 MED ORDER — ALBUTEROL SULFATE (2.5 MG/3ML) 0.083% IN NEBU: 3.0000 mL | INHALATION_SOLUTION | Freq: Four times a day (QID) | RESPIRATORY_TRACT | Status: DC | PRN

## 2018-01-04 MED ORDER — SODIUM CHLORIDE 0.9% FLUSH
3.0000 mL | Freq: Two times a day (BID) | INTRAVENOUS | Status: DC
  Administered 2018-01-04 – 2018-01-07 (×5): 3 mL via INTRAVENOUS

## 2018-01-04 MED ORDER — PIPERACILLIN-TAZOBACTAM 3.375 G IVPB 30 MIN: 3.3750 g | Freq: Once | INTRAVENOUS | Status: DC

## 2018-01-04 MED ORDER — VANCOMYCIN HCL IN DEXTROSE 1-5 GM/200ML-% IV SOLN
1000.0000 mg | Freq: Once | INTRAVENOUS | Status: DC
  Filled 2018-01-04: qty 200

## 2018-01-04 MED ORDER — UMECLIDINIUM-VILANTEROL 62.5-25 MCG/INH IN AEPB
1.0000 | INHALATION_SPRAY | Freq: Every day | RESPIRATORY_TRACT | Status: DC
  Administered 2018-01-05 – 2018-01-07 (×3): 1 via RESPIRATORY_TRACT
  Filled 2018-01-04: qty 14

## 2018-01-04 MED ORDER — VANCOMYCIN HCL IN DEXTROSE 1-5 GM/200ML-% IV SOLN
1000.0000 mg | Freq: Once | INTRAVENOUS | Status: AC
  Administered 2018-01-04: 1000 mg via INTRAVENOUS
  Filled 2018-01-04: qty 200

## 2018-01-04 MED ORDER — VANCOMYCIN HCL IN DEXTROSE 750-5 MG/150ML-% IV SOLN
750.0000 mg | Freq: Three times a day (TID) | INTRAVENOUS | Status: DC
Start: 2018-01-05 — End: 2018-01-07
  Administered 2018-01-05 – 2018-01-07 (×7): 750 mg via INTRAVENOUS
  Filled 2018-01-04 (×9): qty 150

## 2018-01-04 MED ORDER — SODIUM CHLORIDE 0.9 % IV SOLN: 250.0000 mL | INTRAVENOUS | Status: DC | PRN

## 2018-01-04 MED ORDER — ESCITALOPRAM OXALATE 10 MG PO TABS
10.0000 mg | ORAL_TABLET | Freq: Every day | ORAL | Status: DC
  Administered 2018-01-05 – 2018-01-07 (×3): 10 mg via ORAL
  Filled 2018-01-04 (×3): qty 1

## 2018-01-04 MED ORDER — OXYCODONE-ACETAMINOPHEN 5-325 MG PO TABS
1.0000 | ORAL_TABLET | Freq: Once | ORAL | Status: AC
  Administered 2018-01-04: 1 via ORAL
  Filled 2018-01-04: qty 1

## 2018-01-04 MED ORDER — TESTOSTERONE 20.25 MG/1.25GM (1.62%) TD GEL: 2.0000 | Freq: Every morning | TRANSDERMAL | Status: DC

## 2018-01-04 MED ORDER — ACETAMINOPHEN 650 MG RE SUPP: 650.0000 mg | Freq: Four times a day (QID) | RECTAL | Status: DC | PRN

## 2018-01-04 MED ORDER — ACETAMINOPHEN 325 MG PO TABS: 650.0000 mg | ORAL_TABLET | Freq: Four times a day (QID) | ORAL | Status: DC | PRN

## 2018-01-04 MED ORDER — ATORVASTATIN CALCIUM 20 MG PO TABS
20.0000 mg | ORAL_TABLET | Freq: Every day | ORAL | Status: DC
  Administered 2018-01-05 – 2018-01-06 (×2): 20 mg via ORAL
  Filled 2018-01-04 (×2): qty 1

## 2018-01-04 NOTE — Progress Notes (Signed)
PHARMACIST - PHYSICIAN ORDER COMMUNICATION  CONCERNING: P&T Medication Policy on Herbal Medications  DESCRIPTION:  This patient's order for:  CoQ10  has been noted.  This product(s) is classified as an "herbal" or natural product. Due to a lack of definitive safety studies or FDA approval, nonstandard manufacturing practices, plus the potential risk of unknown drug-drug interactions while on inpatient medications, the Pharmacy and Therapeutics Committee does not permit the use of "herbal" or natural products of this type within [facility].   ACTION TAKEN: The pharmacy department is unable to verify this order at this time and your patient has been informed of this safety policy. Please reevaluate patient's clinical condition at discharge and address if the herbal or natural product(s) should be resumed at that time.   Ulice Dash, PharmD Clinical Pharmacist

## 2018-01-04 NOTE — Progress Notes (Signed)
Pharmacy Antibiotic Note  Diron Haddon is a 57 y.o. male admitted on 01/04/2018 with cellulitis.  Pharmacy has been consulted for vancomycin and Zosyn dosing.  Plan: Zosyn 3.375 g EI q 8 hours.   Vancomycin 1000 mg iv once followed by 750 mg iv q 8 hours with stacked dosing and a trough with the 5th dose.   Goal trough 15-20 mcg/ml until r/o abscess/osteo.      Temp (24hrs), Avg:98.2 F (36.8 C), Min:97.7 F (36.5 C), Max:99.1 F (37.3 C)  Recent Labs  Lab 01/03/18 1219 01/03/18 1240 01/04/18 2025  WBC  --  11.9* 11.6*  CREATININE 0.73  --  0.84    Estimated Creatinine Clearance: 95.1 mL/min (by C-G formula based on SCr of 0.84 mg/dL).    Allergies  Allergen Reactions  . Bactrim [Sulfamethoxazole-Trimethoprim] Nausea And Vomiting    Antimicrobials this admission: vancomycin 7/17 >>  Zosyn 7/17 >>   Dose adjustments this admission:   Microbiology results:   Thank you for allowing pharmacy to be a part of this patient's care.  Napoleon Form 01/04/2018 9:46 PM

## 2018-01-04 NOTE — ED Triage Notes (Signed)
Pt has an abscess noted to left groin. Swelling or redness has not worsened outside of lines marked yesterday. PT was seen in ED last night and was placed on PO antibiotics and told they could not drain the area because it was not an abscess. Pt took one antibiotic today and then google searched that the pills would take 10  Days to relieve the pain and pt reports he can not wait that long. 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 No fevers at home.
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
## 2018-01-04 NOTE — ED Notes (Signed)
Report called to [facility] RN, NAD, VSS. Family at bedside.
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
## 2018-01-04 NOTE — Progress Notes (Signed)
Family Meeting Note  Advance Directive:yes  Today a meeting took place with the Patient.  Patient is able to participate   The following clinical team members were present during this meeting:MD  The following were discussed:Patient's diagnosis: Acute phlegmon/cellulitis, Patient's progosis: Unable to determine and Goals for treatment: Full Code  Additional follow-up to be provided: prn  Time spent during discussion:20 minutes  Gorden Harms, MD

## 2018-01-05 ENCOUNTER — Inpatient Hospital Stay: Payer: 59

## 2018-01-05 ENCOUNTER — Encounter: Payer: Self-pay | Admitting: Radiology

## 2018-01-05 DIAGNOSIS — L03314 Cellulitis of groin: Secondary | ICD-10-CM

## 2018-01-05 IMAGING — CT CT ABD-PELV W/ CM
2 of 5 series · 14 of 46 positions shown, 16 images · IV contrast (APPLIED)
Comparison: CT abdomen pelvis of [DATE]

CLINICAL DATA: Acute swelling of the left groin, evaluate for
abscess

EXAM:
CT ABDOMEN AND PELVIS WITH CONTRAST
TECHNIQUE: Multidetector CT imaging of the abdomen and pelvis was performed
using the standard protocol following bolus administration of
intravenous contrast.
CONTRAST:  100mL OMNIPAQUE IOHEXOL 300 MG/ML  SOLN

[Series 2: routine abd/pel with · axial · 0.70mm/px · z∈[-1001,-521]mm · 11 of 108 slices shown, 13 images]
[im 6/108  soft-tissue]
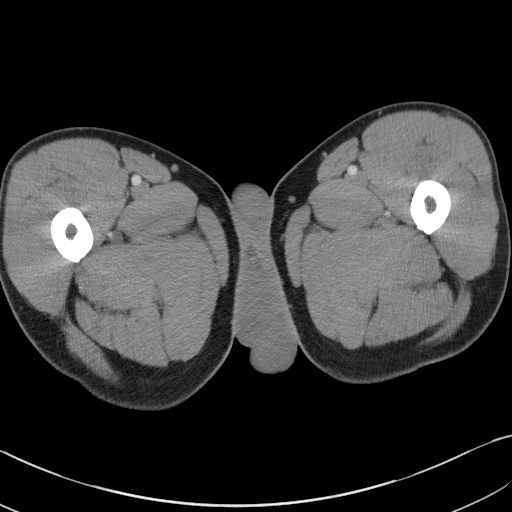
[im 6/108  bone]
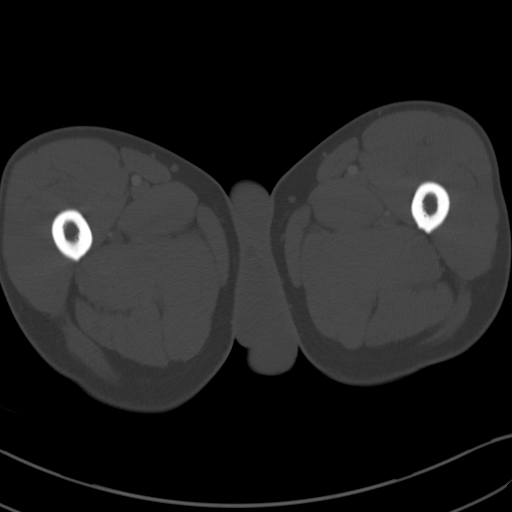
[im 17/108  soft-tissue]
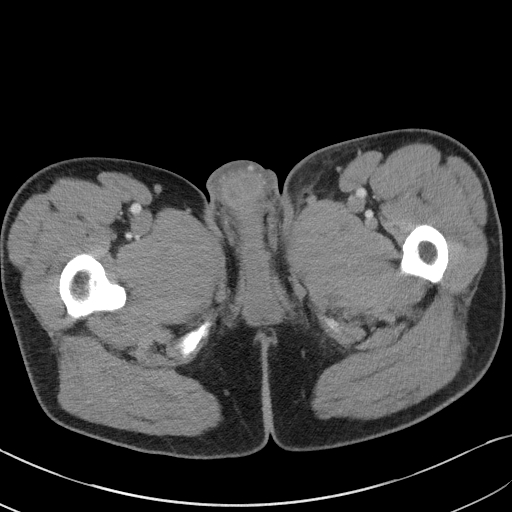
[im 29/108  soft-tissue]
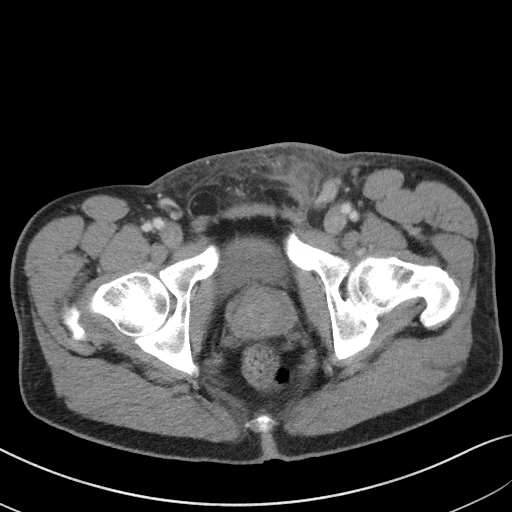
[im 34/108  soft-tissue]
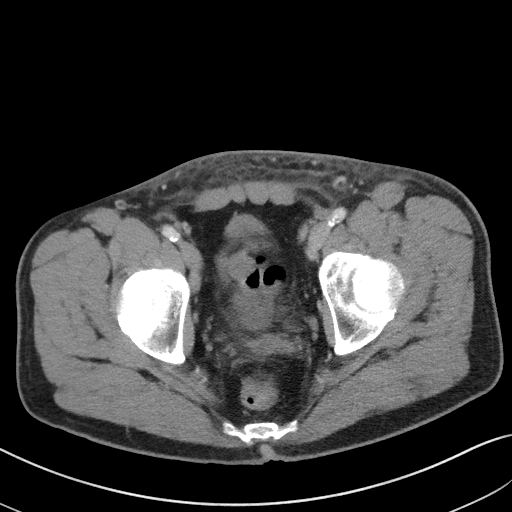
[im 46/108  soft-tissue]
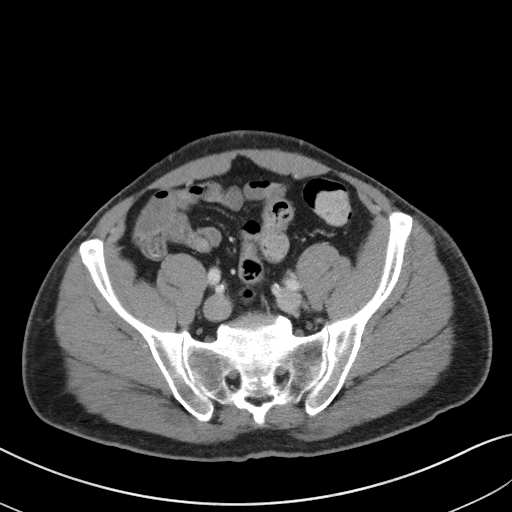
[im 57/108  soft-tissue]
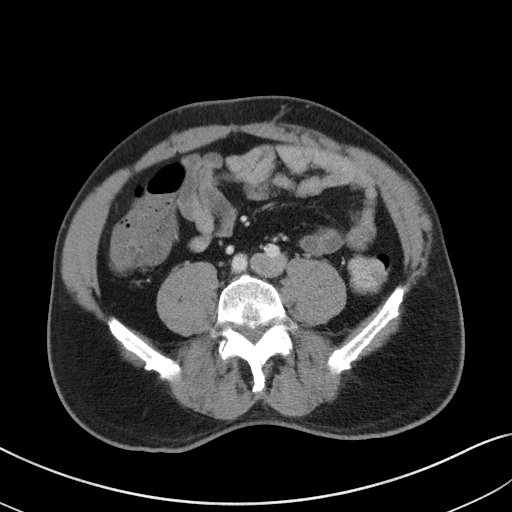
[im 62/108  soft-tissue]
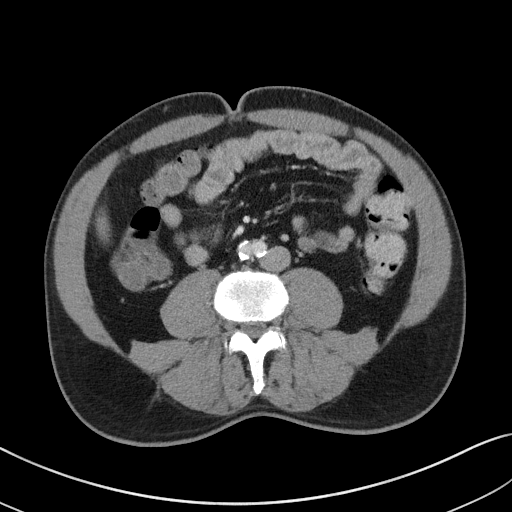
[im 74/108  soft-tissue]
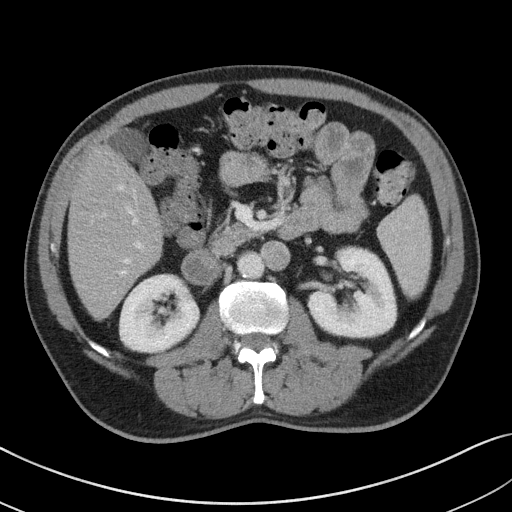
[im 79/108  soft-tissue]
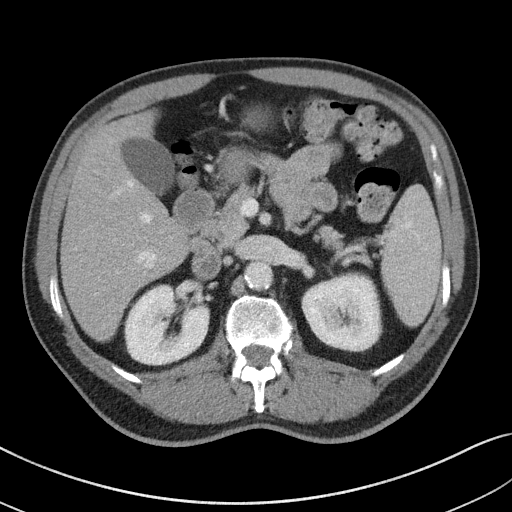
[im 79/108  bone]
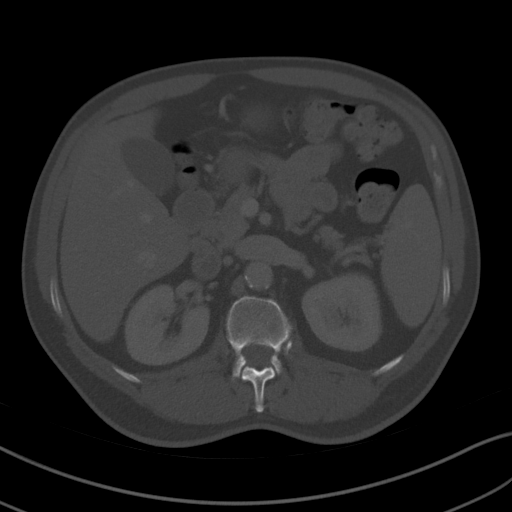
[im 91/108  soft-tissue]
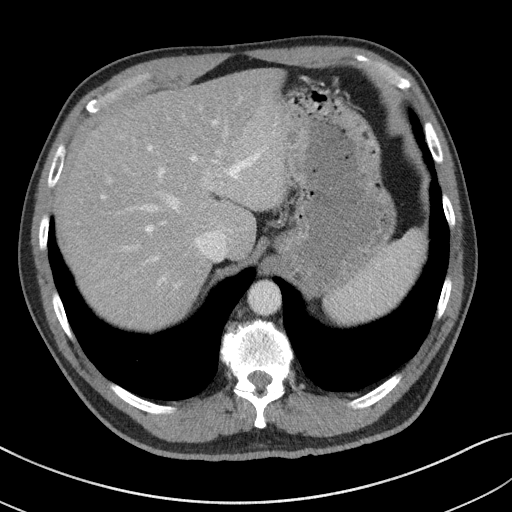
[im 102/108  soft-tissue]
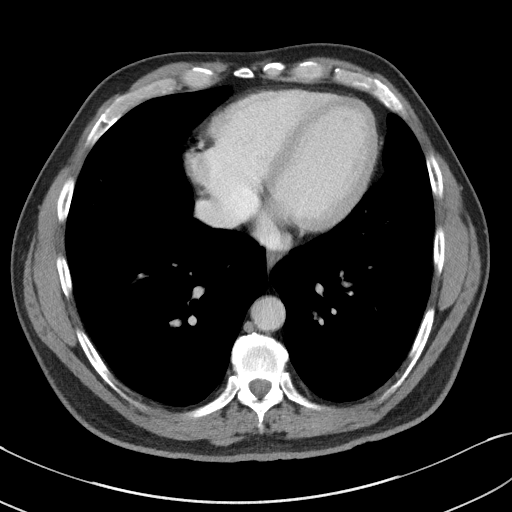

[Series 5: coronal st · coronal · 0.67mm/px · 3 of 100 slices shown]
[im 34/100  soft-tissue]
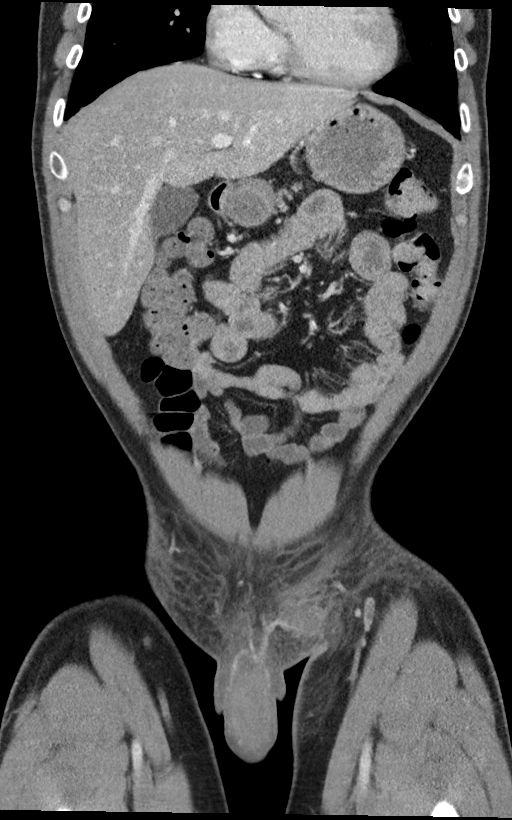
[im 45/100  soft-tissue]
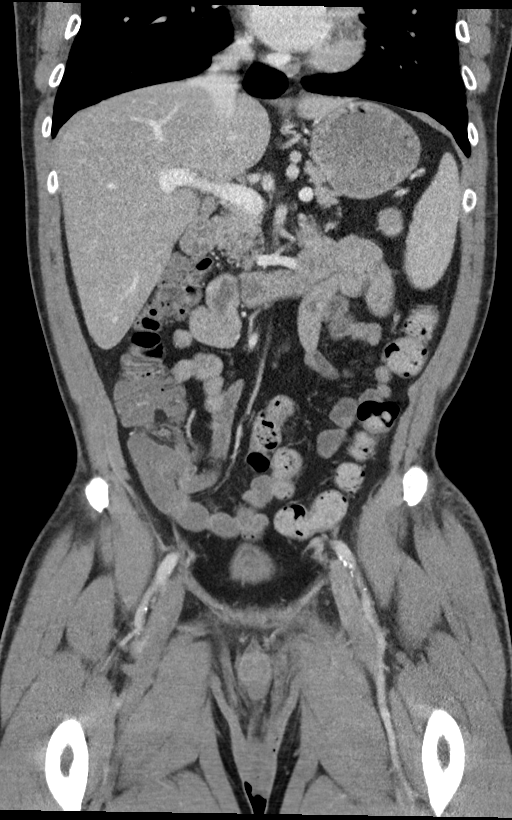
[im 56/100  soft-tissue]
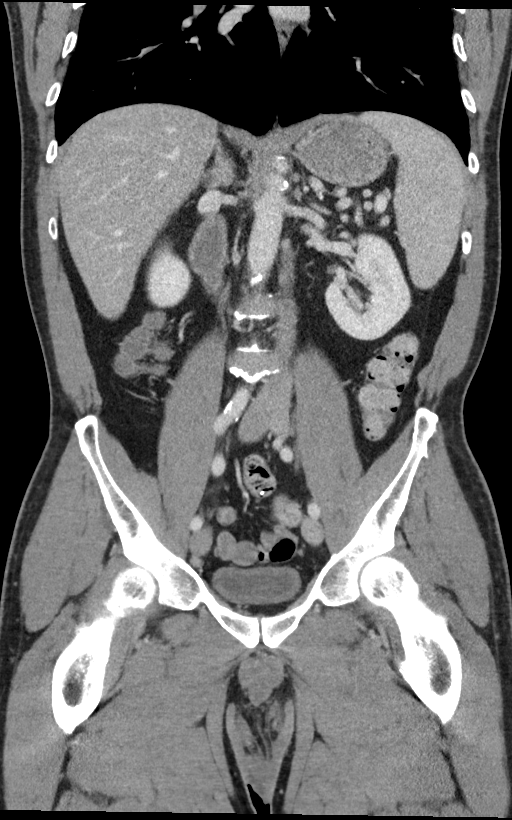

[14 of 46 positions shown; findings below may reference images not displayed]

FINDINGS: Lower chest: Small nodular opacity peripherally in the lingula is
stable. No pneumonia or effusion is seen. The heart is within normal
limits in size. No pericardial effusion is seen.

Hepatobiliary: There is differential perfusion of the liver with
diminished perfusion throughout much of the right lobe. This could
be due to geographic fatty infiltration. No other abnormality is
seen. No calcified gallstones are seen.

Pancreas: The pancreas is normal in size and the pancreatic duct is
not dilated. There is a small low-attenuation area within the tail
of the pancreas measuring 9 mm in diameter. Not definitely seen on
the prior CT of a few days ago,, and a developing small pseudocyst
cannot be excluded.

Spleen: The spleen is unremarkable.

Adrenals/Urinary Tract: The adrenal glands appear normal. The
kidneys enhance with no evidence of mass or calculus. On delayed
images, the pelvocaliceal systems are unremarkable. No
hydronephrosis is seen. The ureters appear normal in caliber. The
urinary bladder is not well distended but no abnormality is seen.

Stomach/Bowel: The stomach is moderately distended with fluid and
food debris without abnormality. No small bowel abnormality is seen.
There is some feces throughout the colon. No lesion is evident. The
appendix and terminal ileum are unremarkable.

Vascular/Lymphatic: The abdominal aorta is normal in caliber with as
noted previously, age advanced abdominal aortic atherosclerosis.. A
vascular variation of left-sided IVC is present. No adenopathy is
seen.

Reproductive: The prostate is within upper limits of normal.

Other: There is now a slightly more confluent area of soft tissue
swelling and edema in the left groin, pubic, and suprapubic regions.
On image number 83 series 2 this confluent area measures 2.3 x
cm, being somewhat less distinct on the prior study. However no
discrete drainable abscess is seen. There is cellulitis present
diffusely within the soft tissues of the left groin, and
pubic-suprapubic regions. No air is seen within the soft tissues.

Musculoskeletal: The lumbar vertebrae are normal alignment with
normal intervertebral disc spaces.
IMPRESSION: 1. Little change in cellulitis/ inflammatory process within the l
left groin and suprapubic-pubic region which is slightly more
confluent currently but still not a definite abscess.
2. Fatty infiltration of the liver.
3. Advanced atherosclerotic changes of the abdominal aorta.
4. New 9 mm low-attenuation structure in the tail of the pancreas
not definitely seen previously. Possible developing small
pseudocyst. Correlate clinically. Consider MR if further assessment
is warranted.
5. Normal variation of left-sided IVC.

## 2018-01-05 MED ORDER — KETOROLAC TROMETHAMINE 30 MG/ML IJ SOLN
30.0000 mg | Freq: Four times a day (QID) | INTRAMUSCULAR | Status: DC | PRN
  Administered 2018-01-05 – 2018-01-06 (×5): 30 mg via INTRAVENOUS
  Filled 2018-01-05 (×5): qty 1

## 2018-01-05 MED ORDER — IOHEXOL 300 MG/ML  SOLN
100.0000 mL | Freq: Once | INTRAMUSCULAR | Status: AC | PRN
  Administered 2018-01-05: 100 mL via INTRAVENOUS
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
## 2018-01-05 NOTE — Consult Note (Signed)
CT pelvis shows small area of fluid in the left hemi-scrotum that may be amenable to drainage. Will d/w patient and I&D area this afternoon if able.

## 2018-01-05 NOTE — Progress Notes (Signed)
[place] [facility]                                                                                                                                                                                  Patient Demographics   Brian Wolfe, is a 57 y.o. male, DOB - [birth year], YBO:[id number]  Admit date - 01/04/2018   Admitting Physician Gorden Harms, MD  Outpatient Primary MD for the patient is [name], NP   LOS - 1  Subjective: Patient continues to complain of significant pain in the left groin area    Review of Systems:   CONSTITUTIONAL: No documented fever. No fatigue, weakness. No weight gain, no weight loss.  EYES: No blurry or double vision.  ENT: No tinnitus. No postnasal drip. No redness of the oropharynx.  RESPIRATORY: No cough, no wheeze, no hemoptysis. No dyspnea.  CARDIOVASCULAR: No chest pain. No orthopnea. No palpitations. No syncope.  GASTROINTESTINAL: No nausea, no vomiting or diarrhea. No abdominal pain. No melena or hematochezia.  GENITOURINARY: No dysuria or hematuria.  ENDOCRINE: No polyuria or nocturia. No heat or cold intolerance.  HEMATOLOGY: No anemia. No bruising. No bleeding.  INTEGUMENTARY: Swelling in the left groin area and redness MUSCULOSKELETAL: No arthritis. No swelling. No gout.  NEUROLOGIC: No numbness, tingling, or ataxia. No seizure-type activity.  PSYCHIATRIC: No anxiety. No insomnia. No ADD.    Vitals:   Vitals:   01/04/18 2027 01/04/18 2120 01/05/18 0627 01/05/18 1237  BP: (!) 142/80 (!) 150/89 113/73 110/74  Pulse: 63 (!) 52 69 60  Resp:  20 16 14   Temp: 97.7 F (36.5 C) 97.7 F (36.5 C) 98.3 F (36.8 C) 98.1 F (36.7 C)  TempSrc: Axillary Oral Oral Oral  SpO2: 97% 100% 96% 98%  Weight:  72.6 kg (160 lb)    Height:  5\' 5"  (1.651 m)      Wt Readings from Last 3 Encounters:  01/04/18 72.6 kg (160 lb)  01/03/18 77.1 kg (170 lb)  10/12/17 75.8 kg (167 lb)     Intake/Output Summary  (Last 24 hours) at 01/05/2018 1358 Last data filed at 01/05/2018 1354 Gross per 24 hour  Intake 1790 ml  Output 2200 ml  Net -410 ml    Physical Exam:   GENERAL: Pleasant-appearing in no apparent distress.  HEAD, EYES, EARS, NOSE AND THROAT: Atraumatic, normocephalic. Extraocular muscles are intact. Pupils equal and reactive to light. Sclerae anicteric. No conjunctival injection. No oro-pharyngeal erythema.  NECK: Supple. There is no jugular venous distention. No bruits, no lymphadenopathy, no thyromegaly.  HEART: Regular rate and rhythm,. No murmurs, no rubs, no clicks.  LUNGS: Clear to auscultation  bilaterally. No rales or rhonchi. No wheezes.  ABDOMEN: Soft, flat, nontender, nondistended. Has good bowel sounds. No hepatosplenomegaly appreciated.  EXTREMITIES: No evidence of any cyanosis, clubbing, or peripheral edema.  +2 pedal and radial pulses bilaterally.  NEUROLOGIC: The patient is alert, awake, and oriented x3 with no focal motor or sensory deficits appreciated bilaterally.  SKIN: Left groin area with swelling and redness Psych: Not anxious, depressed LN: No inguinal LN enlargement    Antibiotics   Anti-infectives (From admission, onward)   Start     Dose/Rate Route Frequency Ordered Stop   01/05/18 0600  vancomycin (VANCOCIN) IVPB 750 mg/150 ml premix     750 mg 150 mL/hr over 60 Minutes Intravenous Every 8 hours 01/04/18 2132     01/05/18 0030  piperacillin-tazobactam (ZOSYN) IVPB 3.375 g     3.375 g 12.5 mL/hr over 240 Minutes Intravenous Every 8 hours 01/04/18 2132     01/04/18 2130  piperacillin-tazobactam (ZOSYN) IVPB 3.375 g  Status:  Discontinued     3.375 g 100 mL/hr over 30 Minutes Intravenous  Once 01/04/18 2120 01/04/18 2140   01/04/18 2130  vancomycin (VANCOCIN) IVPB 1000 mg/200 mL premix  Status:  Discontinued     1,000 mg 200 mL/hr over 60 Minutes Intravenous  Once 01/04/18 2120 01/04/18 2244   01/04/18 2015  vancomycin (VANCOCIN) IVPB 1000 mg/200 mL premix      1,000 mg 200 mL/hr over 60 Minutes Intravenous  Once 01/04/18 2010 01/04/18 2241   01/04/18 2015  piperacillin-tazobactam (ZOSYN) IVPB 3.375 g     3.375 g 100 mL/hr over 30 Minutes Intravenous  Once 01/04/18 2010 01/04/18 2107      Medications   Scheduled Meds: . atorvastatin  20 mg Oral q1800  . enoxaparin (LOVENOX) injection  40 mg Subcutaneous Q24H  . escitalopram  10 mg Oral Daily  . pantoprazole  40 mg Oral Daily  . sodium chloride flush  3 mL Intravenous Q12H  . umeclidinium-vilanterol  1 puff Inhalation Daily   Continuous Infusions: . sodium chloride    . piperacillin-tazobactam (ZOSYN)  IV Stopped (01/05/18 1103)  . vancomycin Stopped (01/05/18 1354)   PRN Meds:.sodium chloride, acetaminophen **OR** acetaminophen, albuterol, HYDROcodone-acetaminophen, ketorolac, ondansetron **OR** ondansetron (ZOFRAN) IV, sodium chloride flush   Data Review:   Micro Results No results found for this or any previous visit (from the past 240 hour(s)).  Radiology Reports Ct Abdomen Pelvis W Contrast  Result Date: 01/05/2018 CLINICAL DATA:  Acute swelling of the left groin, evaluate for abscess EXAM: CT ABDOMEN AND PELVIS WITH CONTRAST TECHNIQUE: Multidetector CT imaging of the abdomen and pelvis was performed using the standard protocol following bolus administration of intravenous contrast. CONTRAST:  11mL OMNIPAQUE IOHEXOL 300 MG/ML  SOLN COMPARISON:  CT abdomen pelvis of 01/03/2018 FINDINGS: Lower chest: Small nodular opacity peripherally in the lingula is stable. No pneumonia or effusion is seen. The heart is within normal limits in size. No pericardial effusion is seen. Hepatobiliary: There is differential perfusion of the liver with diminished perfusion throughout much of the right lobe. This could be due to geographic fatty infiltration. No other abnormality is seen. No calcified gallstones are seen. Pancreas: The pancreas is normal in size and the pancreatic duct is not dilated.  There is a small low-attenuation area within the tail of the pancreas measuring 9 mm in diameter. Not definitely seen on the prior CT of a few days ago,, and a developing small pseudocyst cannot be excluded. Spleen: The spleen is unremarkable. Adrenals/Urinary  Tract: The adrenal glands appear normal. The kidneys enhance with no evidence of mass or calculus. On delayed images, the pelvocaliceal systems are unremarkable. No hydronephrosis is seen. The ureters appear normal in caliber. The urinary bladder is not well distended but no abnormality is seen. Stomach/Bowel: The stomach is moderately distended with fluid and food debris without abnormality. No small bowel abnormality is seen. There is some feces throughout the colon. No lesion is evident. The appendix and terminal ileum are unremarkable. Vascular/Lymphatic: The abdominal aorta is normal in caliber with as noted previously, age advanced abdominal aortic atherosclerosis. A vascular variation of left-sided IVC is present. No adenopathy is seen. Reproductive: The prostate is within upper limits of normal. Other: There is now a slightly more confluent area of soft tissue swelling and edema in the left groin, pubic, and suprapubic regions. On image number 83 series 2 this confluent area measures 2.3 x 3.5 cm, being somewhat less distinct on the prior study. However no discrete drainable abscess is seen. There is cellulitis present diffusely within the soft tissues of the left groin, and pubic-suprapubic regions. No air is seen within the soft tissues. Musculoskeletal: The lumbar vertebrae are normal alignment with normal intervertebral disc spaces. IMPRESSION: 1. Little change in cellulitis/ inflammatory process within the l left groin and suprapubic-pubic region which is slightly more confluent currently but still not a definite abscess. 2. Fatty infiltration of the liver. 3. Advanced atherosclerotic changes of the abdominal aorta. 4. New 9 mm low-attenuation  structure in the tail of the pancreas not definitely seen previously. Possible developing small pseudocyst. Correlate clinically. Consider MR if further assessment is warranted. 5. Normal variation of left-sided IVC. Electronically Signed   By: Ivar Drape M.D.   On: 01/05/2018 13:24   Ct Abdomen Pelvis W Contrast  Result Date: 01/03/2018 CLINICAL DATA:  Left inguinal pain and swelling. EXAM: CT ABDOMEN AND PELVIS WITH CONTRAST TECHNIQUE: Multidetector CT imaging of the abdomen and pelvis was performed using the standard protocol following bolus administration of intravenous contrast. CONTRAST:  140mL OMNIPAQUE IOHEXOL 300 MG/ML  SOLN COMPARISON:  None. FINDINGS: Lower chest: Stable 5.5 mm subpleural pulmonary nodule in the lingula since prior study from 2010. No worrisome pulmonary lesions or acute pulmonary findings. Stable emphysematous changes. The heart is normal in size. Coronary artery calcifications are noted. Hepatobiliary: No focal hepatic lesions or intrahepatic biliary dilatation. Mild diffuse fatty infiltration of the liver. Slight irregularity of the hepatic surface and slight increased caudate to right lobe ratio may suggest mild changes of cirrhosis. There are also perigastric and perisplenic collateral vessels suggesting portal venous hypertension. The gallbladder appears normal. No common bile duct dilatation. Pancreas: No mass, inflammation or ductal dilatation. Spleen: Upper limits of normal in size measuring 11.5 x 10.5 x 8.0 cm. No focal lesions. Adrenals/Urinary Tract: The adrenal glands and kidneys are unremarkable. No renal, ureteral or bladder calculi or mass. Stomach/Bowel: The stomach, duodenum, small bowel and colon are unremarkable. No acute inflammatory changes, mass lesions or obstructive findings. The terminal ileum and appendix are normal. Vascular/Lymphatic: Age advanced atherosclerotic calcifications involving the aorta and iliac arteries. No aneurysm or dissection. The branch  vessels are patent. The major venous structures are patent. Incidental left-sided IVC. No mesenteric or retroperitoneal mass or adenopathy. Reproductive: The prostate gland and seminal vesicles are unremarkable. Other: Diffuse subcutaneous soft tissue swelling/edema/fluid suggesting cellulitis in the pubic and suprapubic region. More focal inflammatory phlegmon noted in the left inguinal area adjacent to the left inguinal canal. I  do not see a discrete drainable rim enhancing fluid collection. Some cellulitic changes also involving the base of the penis. Small scattered inguinal lymph nodes. Musculoskeletal: No significant bony findings. IMPRESSION: 1. Cellulitis and more focal area of phlegmonous inflammatory changes in the left groin area without discrete drainable abscess. 2. No acute intra-abdominal/pelvic inflammatory process or mass lesion or adenopathy. 3. Suspect mild/early cirrhotic changes involving the liver with portal venous hypertension and portal venous collaterals. No ascites and borderline splenic size. 4. Mild diffuse fatty infiltration of the liver. 5. Age advanced atherosclerotic calcifications involving the coronary arteries and abdominal aorta and iliac arteries. Electronically Signed   By: Marijo Sanes M.D.   On: 01/03/2018 14:00     CBC Recent Labs  Lab 01/03/18 1240 01/04/18 2025  WBC 11.9* 11.6*  HGB 13.7 13.9  HCT 39.1* 39.6*  PLT 181 179  MCV 93.4 93.8  MCH 32.7 33.0  MCHC 35.0 35.2  RDW 13.1 13.0  LYMPHSABS  --  2.9  MONOABS  --  0.9  EOSABS  --  0.3  BASOSABS  --  0.0    Chemistries  Recent Labs  Lab 01/03/18 1219 01/04/18 2025  NA 139 139  K 4.3 3.9  CL 106 107  CO2 26 24  GLUCOSE 71 130*  BUN 9 15  CREATININE 0.73 0.84  CALCIUM 9.3 9.0  AST 22  --   ALT 19  --   ALKPHOS 79  --   BILITOT 1.4*  --    ------------------------------------------------------------------------------------------------------------------ estimated creatinine clearance  is 86.4 mL/min (by C-G formula based on SCr of 0.84 mg/dL). ------------------------------------------------------------------------------------------------------------------ No results for input(s): HGBA1C in the last 72 hours. ------------------------------------------------------------------------------------------------------------------ No results for input(s): CHOL, HDL, LDLCALC, TRIG, CHOLHDL, LDLDIRECT in the last 72 hours. ------------------------------------------------------------------------------------------------------------------ No results for input(s): TSH, T4TOTAL, T3FREE, THYROIDAB in the last 72 hours.  Invalid input(s): FREET3 ------------------------------------------------------------------------------------------------------------------ No results for input(s): VITAMINB12, FOLATE, FERRITIN, TIBC, IRON, RETICCTPCT in the last 72 hours.  Coagulation profile No results for input(s): INR, PROTIME in the last 168 hours.  No results for input(s): DDIMER in the last 72 hours.  Cardiac Enzymes No results for input(s): CKMB, TROPONINI, MYOGLOBIN in the last 168 hours.  Invalid input(s): CK ------------------------------------------------------------------------------------------------------------------ Invalid input(s): [place]   *Acute left groin cellulitis Patient continues to have significant pain I discussed with Dr. Shaune Leeks who will see the patient he recommended we repeat a CT Continue IV antibiotics with bank and Zosyn  *Chronic GERD without esophagitis PPI daily  *Chronic depression Stable Continue Lexapro  *Chronic hypogonadism Stable Continue AndroGel  *Chronic hyperlipidemia, unspecified Continue statin therapy  *Nicotine abuse smoking cessation provided by the admitting physician       Code Status Orders  (From admission, onward)        Start     Ordered   01/04/18 2120  Full code  Continuous      01/04/18 2120    Code Status History    This patient has a current code status but no historical code status.    Advance Directive Documentation     Most Recent Value  Type of Advance Directive  Healthcare Power of Attorney  Pre-existing out of facility DNR order (yellow form or pink MOST form)  -  "MOST" Form in Place?  -           Consults  85min  DVT Prophylaxis  scd's  Lab Results  Component Value Date   PLT  179 01/04/2018     Time Spent in minutes 63min Greater than 50% of time spent in care coordination and counseling patient regarding the condition and plan of care.   Dustin Flock M.D on 01/05/2018 at 1:58 PM  Between 7am to 6pm - Pager - [phone]  After 6pm go to [email or web address] - Proofreader  [employer]   Office  [phone]

## 2018-01-06 DIAGNOSIS — L03314 Cellulitis of groin: Principal | ICD-10-CM

## 2018-01-06 LAB — HIV ANTIBODY (ROUTINE TESTING W REFLEX): HIV SCREEN 4TH GENERATION: NONREACTIVE

## 2018-01-06 LAB — VANCOMYCIN, TROUGH: Vancomycin Tr: 10 ug/mL — ABNORMAL LOW (ref 15–20)

## 2018-01-06 LAB — CBC WITH DIFFERENTIAL/PLATELET
Basophils Absolute: 0.1 10*3/uL (ref 0–0.1)
Basophils Relative: 1 %
Eosinophils Absolute: 0.3 10*3/uL (ref 0–0.7)
Eosinophils Relative: 4 %
HEMATOCRIT: 37.8 % — AB (ref 40.0–52.0)
HEMOGLOBIN: 13.1 g/dL (ref 13.0–18.0)
LYMPHS ABS: 1.9 10*3/uL (ref 1.0–3.6)
Lymphocytes Relative: 22 %
MCH: 32.4 pg (ref 26.0–34.0)
MCHC: 34.7 g/dL (ref 32.0–36.0)
MCV: 93.4 fL (ref 80.0–100.0)
MONO ABS: 0.7 10*3/uL (ref 0.2–1.0)
Monocytes Relative: 8 %
NEUTROS PCT: 65 %
Neutro Abs: 5.6 10*3/uL (ref 1.4–6.5)
Platelets: 195 10*3/uL (ref 150–440)
RBC: 4.04 MIL/uL — AB (ref 4.40–5.90)
RDW: 12.8 % (ref 11.5–14.5)
WBC: 8.6 10*3/uL (ref 3.8–10.6)

## 2018-01-06 LAB — HEPATITIS PANEL, ACUTE
HEP A IGM: NEGATIVE
HEP B C IGM: NEGATIVE
Hepatitis B Surface Ag: NEGATIVE

## 2018-01-06 NOTE — Progress Notes (Signed)
[place] [facility]                                                                                                                                                                                  Patient Demographics   Brian Wolfe, is a 57 y.o. male, DOB - March 16, 1961, NWG:[id number]  Admit date - 01/04/2018   Admitting Physician Gorden Harms, MD  Outpatient Primary MD for the patient is [name], NP   LOS - 2  Subjective: Patient doing better erythema in his left groin is improved    Review of Systems:   CONSTITUTIONAL: No documented fever. No fatigue, weakness. No weight gain, no weight loss.  EYES: No blurry or double vision.  ENT: No tinnitus. No postnasal drip. No redness of the oropharynx.  RESPIRATORY: No cough, no wheeze, no hemoptysis. No dyspnea.  CARDIOVASCULAR: No chest pain. No orthopnea. No palpitations. No syncope.  GASTROINTESTINAL: No nausea, no vomiting or diarrhea. No abdominal pain. No melena or hematochezia.  GENITOURINARY: No dysuria or hematuria.  ENDOCRINE: No polyuria or nocturia. No heat or cold intolerance.  HEMATOLOGY: No anemia. No bruising. No bleeding.  INTEGUMENTARY: Swelling in the left groin area and redness MUSCULOSKELETAL: No arthritis. No swelling. No gout.  NEUROLOGIC: No numbness, tingling, or ataxia. No seizure-type activity.  PSYCHIATRIC: No anxiety. No insomnia. No ADD.    Vitals:   Vitals:   01/05/18 0627 01/05/18 1237 01/05/18 2120 01/06/18 0428  BP: 113/73 110/74 126/80 (!) 97/51  Pulse: 69 60 69 84  Resp: 16 14  20   Temp: 98.3 F (36.8 C) 98.1 F (36.7 C) 98.4 F (36.9 C) 98.3 F (36.8 C)  TempSrc: Oral Oral Oral Oral  SpO2: 96% 98% 97% 96%  Weight:      Height:        Wt Readings from Last 3 Encounters:  01/04/18 72.6 kg (160 lb)  01/03/18 77.1 kg (170 lb)  10/12/17 75.8 kg (167 lb)     Intake/Output Summary (Last 24 hours) at 01/06/2018 1343 Last data filed at  01/06/2018 1031 Gross per 24 hour  Intake 880 ml  Output 1925 ml  Net -1045 ml    Physical Exam:   GENERAL: Pleasant-appearing in no apparent distress.  HEAD, EYES, EARS, NOSE AND THROAT: Atraumatic, normocephalic. Extraocular muscles are intact. Pupils equal and reactive to light. Sclerae anicteric. No conjunctival injection. No oro-pharyngeal erythema.  NECK: Supple. There is no jugular venous distention. No bruits, no lymphadenopathy, no thyromegaly.  HEART: Regular rate and rhythm,. No murmurs, no rubs, no clicks.  LUNGS: Clear to auscultation bilaterally. No rales or rhonchi. No wheezes.  ABDOMEN: Soft,  flat, nontender, nondistended. Has good bowel sounds. No hepatosplenomegaly appreciated.  EXTREMITIES: No evidence of any cyanosis, clubbing, or peripheral edema.  +2 pedal and radial pulses bilaterally.  NEUROLOGIC: The patient is alert, awake, and oriented x3 with no focal motor or sensory deficits appreciated bilaterally.  SKIN: Left groin area with swelling and redness with some improvement in the marked area Psych: Not anxious, depressed LN: No inguinal LN enlargement    Antibiotics   Anti-infectives (From admission, onward)   Start     Dose/Rate Route Frequency Ordered Stop   01/05/18 0600  vancomycin (VANCOCIN) IVPB 750 mg/150 ml premix     750 mg 150 mL/hr over 60 Minutes Intravenous Every 8 hours 01/04/18 2132     01/05/18 0030  piperacillin-tazobactam (ZOSYN) IVPB 3.375 g     3.375 g 12.5 mL/hr over 240 Minutes Intravenous Every 8 hours 01/04/18 2132     01/04/18 2130  piperacillin-tazobactam (ZOSYN) IVPB 3.375 g  Status:  Discontinued     3.375 g 100 mL/hr over 30 Minutes Intravenous  Once 01/04/18 2120 01/04/18 2140   01/04/18 2130  vancomycin (VANCOCIN) IVPB 1000 mg/200 mL premix  Status:  Discontinued     1,000 mg 200 mL/hr over 60 Minutes Intravenous  Once 01/04/18 2120 01/04/18 2244   01/04/18 2015  vancomycin (VANCOCIN) IVPB 1000 mg/200 mL premix     1,000  mg 200 mL/hr over 60 Minutes Intravenous  Once 01/04/18 2010 01/04/18 2241   01/04/18 2015  piperacillin-tazobactam (ZOSYN) IVPB 3.375 g     3.375 g 100 mL/hr over 30 Minutes Intravenous  Once 01/04/18 2010 01/04/18 2107      Medications   Scheduled Meds: . atorvastatin  20 mg Oral q1800  . enoxaparin (LOVENOX) injection  40 mg Subcutaneous Q24H  . escitalopram  10 mg Oral Daily  . pantoprazole  40 mg Oral Daily  . sodium chloride flush  3 mL Intravenous Q12H  . umeclidinium-vilanterol  1 puff Inhalation Daily   Continuous Infusions: . sodium chloride    . piperacillin-tazobactam (ZOSYN)  IV Stopped (01/06/18 1114)  . vancomycin 750 mg (01/06/18 1329)   PRN Meds:.sodium chloride, acetaminophen **OR** acetaminophen, albuterol, HYDROcodone-acetaminophen, ketorolac, ondansetron **OR** ondansetron (ZOFRAN) IV, sodium chloride flush   Data Review:   Micro Results No results found for this or any previous visit (from the past 240 hour(s)).  Radiology Reports Ct Abdomen Pelvis W Contrast  Result Date: 01/05/2018 CLINICAL DATA:  Acute swelling of the left groin, evaluate for abscess EXAM: CT ABDOMEN AND PELVIS WITH CONTRAST TECHNIQUE: Multidetector CT imaging of the abdomen and pelvis was performed using the standard protocol following bolus administration of intravenous contrast. CONTRAST:  12mL OMNIPAQUE IOHEXOL 300 MG/ML  SOLN COMPARISON:  CT abdomen pelvis of 01/03/2018 FINDINGS: Lower chest: Small nodular opacity peripherally in the lingula is stable. No pneumonia or effusion is seen. The heart is within normal limits in size. No pericardial effusion is seen. Hepatobiliary: There is differential perfusion of the liver with diminished perfusion throughout much of the right lobe. This could be due to geographic fatty infiltration. No other abnormality is seen. No calcified gallstones are seen. Pancreas: The pancreas is normal in size and the pancreatic duct is not dilated. There is a  small low-attenuation area within the tail of the pancreas measuring 9 mm in diameter. Not definitely seen on the prior CT of a few days ago,, and a developing small pseudocyst cannot be excluded. Spleen: The spleen is unremarkable. Adrenals/Urinary Tract: The  adrenal glands appear normal. The kidneys enhance with no evidence of mass or calculus. On delayed images, the pelvocaliceal systems are unremarkable. No hydronephrosis is seen. The ureters appear normal in caliber. The urinary bladder is not well distended but no abnormality is seen. Stomach/Bowel: The stomach is moderately distended with fluid and food debris without abnormality. No small bowel abnormality is seen. There is some feces throughout the colon. No lesion is evident. The appendix and terminal ileum are unremarkable. Vascular/Lymphatic: The abdominal aorta is normal in caliber with as noted previously, age advanced abdominal aortic atherosclerosis. A vascular variation of left-sided IVC is present. No adenopathy is seen. Reproductive: The prostate is within upper limits of normal. Other: There is now a slightly more confluent area of soft tissue swelling and edema in the left groin, pubic, and suprapubic regions. On image number 83 series 2 this confluent area measures 2.3 x 3.5 cm, being somewhat less distinct on the prior study. However no discrete drainable abscess is seen. There is cellulitis present diffusely within the soft tissues of the left groin, and pubic-suprapubic regions. No air is seen within the soft tissues. Musculoskeletal: The lumbar vertebrae are normal alignment with normal intervertebral disc spaces. IMPRESSION: 1. Little change in cellulitis/ inflammatory process within the l left groin and suprapubic-pubic region which is slightly more confluent currently but still not a definite abscess. 2. Fatty infiltration of the liver. 3. Advanced atherosclerotic changes of the abdominal aorta. 4. New 9 mm low-attenuation structure in  the tail of the pancreas not definitely seen previously. Possible developing small pseudocyst. Correlate clinically. Consider MR if further assessment is warranted. 5. Normal variation of left-sided IVC. Electronically Signed   By: Ivar Drape M.D.   On: 01/05/2018 13:24   Ct Abdomen Pelvis W Contrast  Result Date: 01/03/2018 CLINICAL DATA:  Left inguinal pain and swelling. EXAM: CT ABDOMEN AND PELVIS WITH CONTRAST TECHNIQUE: Multidetector CT imaging of the abdomen and pelvis was performed using the standard protocol following bolus administration of intravenous contrast. CONTRAST:  175mL OMNIPAQUE IOHEXOL 300 MG/ML  SOLN COMPARISON:  None. FINDINGS: Lower chest: Stable 5.5 mm subpleural pulmonary nodule in the lingula since prior study from 2010. No worrisome pulmonary lesions or acute pulmonary findings. Stable emphysematous changes. The heart is normal in size. Coronary artery calcifications are noted. Hepatobiliary: No focal hepatic lesions or intrahepatic biliary dilatation. Mild diffuse fatty infiltration of the liver. Slight irregularity of the hepatic surface and slight increased caudate to right lobe ratio may suggest mild changes of cirrhosis. There are also perigastric and perisplenic collateral vessels suggesting portal venous hypertension. The gallbladder appears normal. No common bile duct dilatation. Pancreas: No mass, inflammation or ductal dilatation. Spleen: Upper limits of normal in size measuring 11.5 x 10.5 x 8.0 cm. No focal lesions. Adrenals/Urinary Tract: The adrenal glands and kidneys are unremarkable. No renal, ureteral or bladder calculi or mass. Stomach/Bowel: The stomach, duodenum, small bowel and colon are unremarkable. No acute inflammatory changes, mass lesions or obstructive findings. The terminal ileum and appendix are normal. Vascular/Lymphatic: Age advanced atherosclerotic calcifications involving the aorta and iliac arteries. No aneurysm or dissection. The branch vessels are  patent. The major venous structures are patent. Incidental left-sided IVC. No mesenteric or retroperitoneal mass or adenopathy. Reproductive: The prostate gland and seminal vesicles are unremarkable. Other: Diffuse subcutaneous soft tissue swelling/edema/fluid suggesting cellulitis in the pubic and suprapubic region. More focal inflammatory phlegmon noted in the left inguinal area adjacent to the left inguinal canal. I do not  see a discrete drainable rim enhancing fluid collection. Some cellulitic changes also involving the base of the penis. Small scattered inguinal lymph nodes. Musculoskeletal: No significant bony findings. IMPRESSION: 1. Cellulitis and more focal area of phlegmonous inflammatory changes in the left groin area without discrete drainable abscess. 2. No acute intra-abdominal/pelvic inflammatory process or mass lesion or adenopathy. 3. Suspect mild/early cirrhotic changes involving the liver with portal venous hypertension and portal venous collaterals. No ascites and borderline splenic size. 4. Mild diffuse fatty infiltration of the liver. 5. Age advanced atherosclerotic calcifications involving the coronary arteries and abdominal aorta and iliac arteries. Electronically Signed   By: Marijo Sanes M.D.   On: 01/03/2018 14:00     CBC Recent Labs  Lab 01/03/18 1240 01/04/18 2025 01/06/18 1052  WBC 11.9* 11.6* 8.6  HGB 13.7 13.9 13.1  HCT 39.1* 39.6* 37.8*  PLT 181 179 195  MCV 93.4 93.8 93.4  MCH 32.7 33.0 32.4  MCHC 35.0 35.2 34.7  RDW 13.1 13.0 12.8  LYMPHSABS  --  2.9 1.9  MONOABS  --  0.9 0.7  EOSABS  --  0.3 0.3  BASOSABS  --  0.0 0.1    Chemistries  Recent Labs  Lab 01/03/18 1219 01/04/18 2025  NA 139 139  K 4.3 3.9  CL 106 107  CO2 26 24  GLUCOSE 71 130*  BUN 9 15  CREATININE 0.73 0.84  CALCIUM 9.3 9.0  AST 22  --   ALT 19  --   ALKPHOS 79  --   BILITOT 1.4*  --     ------------------------------------------------------------------------------------------------------------------ estimated creatinine clearance is 86.4 mL/min (by C-G formula based on SCr of 0.84 mg/dL). ------------------------------------------------------------------------------------------------------------------ No results for input(s): HGBA1C in the last 72 hours. ------------------------------------------------------------------------------------------------------------------ No results for input(s): CHOL, HDL, LDLCALC, TRIG, CHOLHDL, LDLDIRECT in the last 72 hours. ------------------------------------------------------------------------------------------------------------------ No results for input(s): TSH, T4TOTAL, T3FREE, THYROIDAB in the last 72 hours.  Invalid input(s): FREET3 ------------------------------------------------------------------------------------------------------------------ No results for input(s): VITAMINB12, FOLATE, FERRITIN, TIBC, IRON, RETICCTPCT in the last 72 hours.  Coagulation profile No results for input(s): INR, PROTIME in the last 168 hours.  No results for input(s): DDIMER in the last 72 hours.  Cardiac Enzymes No results for input(s): CKMB, TROPONINI, MYOGLOBIN in the last 168 hours.  Invalid input(s): CK ------------------------------------------------------------------------------------------------------------------ Invalid input(s): [place]   *Acute left groin cellulitis Continue IV antibiotics patient doing better slowly improving  *Chronic GERD without esophagitis PPI daily  *Chronic depression Stable Continue Lexapro  *Chronic hypogonadism Stable Continue AndroGel  *Chronic hyperlipidemia, unspecified Continue statin therapy  *Nicotine abuse smoking cessation provided by the admitting physician       Code Status Orders  (From admission, onward)        Start     Ordered   01/04/18  2120  Full code  Continuous     01/04/18 2120    Code Status History    This patient has a current code status but no historical code status.    Advance Directive Documentation     Most Recent Value  Type of Advance Directive  Healthcare Power of Attorney  Pre-existing out of facility DNR order (yellow form or pink MOST form)  -  "MOST" Form in Place?  -           Consults  82min  DVT Prophylaxis  scd's  Lab Results  Component Value Date   PLT 195 01/06/2018     Time Spent in minutes  62min Greater than 50% of time spent in care coordination and counseling patient regarding the condition and plan of care.   Dustin Flock M.D on 01/06/2018 at 1:43 PM  Between 7am to 6pm - Pager - [phone]  After 6pm go to [email or web address] - Proofreader  [employer]   Office  [phone]

## 2018-01-06 NOTE — Progress Notes (Signed)
Pharmacy Antibiotic Note  Brian Wolfe is a 57 y.o. male admitted on 01/04/2018 with cellulitis.  Pharmacy has been consulted for vancomycin and Zosyn dosing.  Plan: Zosyn 3.375 g EI q 8 hours.   Vancomycin 1000 mg iv once followed by 750 mg iv q 8 hours with stacked dosing and a trough with the 5th dose.   Per Urologist no abscess present so will target range of 10-71mcg/mL.   VL = 10, therapeutic. Will continue with current dosing regimen and recheck level.    Height: 5\' 5"  (165.1 cm) Weight: 160 lb (72.6 kg) IBW/kg (Calculated) : 61.5  Temp (24hrs), Avg:98.4 F (36.9 C), Min:98.3 F (36.8 C), Max:98.4 F (36.9 C)  Recent Labs  Lab 01/03/18 1219 01/03/18 1240 01/04/18 2025 01/06/18 1052 01/06/18 1324  WBC  --  11.9* 11.6* 8.6  --   CREATININE 0.73  --  0.84  --   --   VANCOTROUGH  --   --   --   --  10*    Estimated Creatinine Clearance: 86.4 mL/min (by C-G formula based on SCr of 0.84 mg/dL).    Allergies  Allergen Reactions  . Bactrim [Sulfamethoxazole-Trimethoprim] Nausea And Vomiting    Antimicrobials this admission: vancomycin 7/17 >>  Zosyn 7/17 >>   Dose adjustments this admission:   Microbiology results: [place] PCR 7/17 >> sent  Thank you for allowing pharmacy to be a part of this patient's care.  Lendon Ka, PharmD 01/06/2018 3:16 PM

## 2018-01-06 NOTE — Consult Note (Signed)
Urology Consult Follow Up  Subjective: States pain has resolved except when palpating the left inguinal area. Afebrile.  Anti-infectives: Anti-infectives (From admission, onward)   Start     Dose/Rate Route Frequency Ordered Stop   01/05/18 0600  vancomycin (VANCOCIN) IVPB 750 mg/150 ml premix     750 mg 150 mL/hr over 60 Minutes Intravenous Every 8 hours 01/04/18 2132     01/05/18 0030  piperacillin-tazobactam (ZOSYN) IVPB 3.375 g     3.375 g 12.5 mL/hr over 240 Minutes Intravenous Every 8 hours 01/04/18 2132     01/04/18 2130  piperacillin-tazobactam (ZOSYN) IVPB 3.375 g  Status:  Discontinued     3.375 g 100 mL/hr over 30 Minutes Intravenous  Once 01/04/18 2120 01/04/18 2140   01/04/18 2130  vancomycin (VANCOCIN) IVPB 1000 mg/200 mL premix  Status:  Discontinued     1,000 mg 200 mL/hr over 60 Minutes Intravenous  Once 01/04/18 2120 01/04/18 2244   01/04/18 2015  vancomycin (VANCOCIN) IVPB 1000 mg/200 mL premix     1,000 mg 200 mL/hr over 60 Minutes Intravenous  Once 01/04/18 2010 01/04/18 2241   01/04/18 2015  piperacillin-tazobactam (ZOSYN) IVPB 3.375 g     3.375 g 100 mL/hr over 30 Minutes Intravenous  Once 01/04/18 2010 01/04/18 2107      Current Facility-Administered Medications  Medication Dose Route Frequency Provider Last Rate Last Dose  . 0.9 %  sodium chloride infusion  250 mL Intravenous PRN Salary, Montell D, MD      . acetaminophen (TYLENOL) tablet 650 mg  650 mg Oral Q6H PRN Salary, Montell D, MD       Or  . acetaminophen (TYLENOL) suppository 650 mg  650 mg Rectal Q6H PRN Salary, Montell D, MD      . albuterol (PROVENTIL) (2.5 MG/3ML) 0.083% nebulizer solution 3 mL  3 mL Inhalation Q6H PRN Salary, Montell D, MD      . atorvastatin (LIPITOR) tablet 20 mg  20 mg Oral q1800 Salary, Montell D, MD   20 mg at 01/05/18 1643  . enoxaparin (LOVENOX) injection 40 mg  40 mg Subcutaneous Q24H Salary, Holly Bodily D, MD   40 mg at 01/05/18 2123  . escitalopram (LEXAPRO) tablet  10 mg  10 mg Oral Daily Salary, Montell D, MD   10 mg at 01/06/18 2130  . HYDROcodone-acetaminophen (NORCO/VICODIN) 5-325 MG per tablet 1-2 tablet  1-2 tablet Oral Q4H PRN Salary, Avel Peace, MD   2 tablet at 01/05/18 1814  . ketorolac (TORADOL) 30 MG/ML injection 30 mg  30 mg Intravenous Q6H PRN Dustin Flock, MD   30 mg at 01/06/18 0538  . ondansetron (ZOFRAN) tablet 4 mg  4 mg Oral Q6H PRN Salary, Montell D, MD       Or  . ondansetron (ZOFRAN) injection 4 mg  4 mg Intravenous Q6H PRN Salary, Montell D, MD      . pantoprazole (PROTONIX) EC tablet 40 mg  40 mg Oral Daily Salary, Montell D, MD   40 mg at 01/06/18 0838  . piperacillin-tazobactam (ZOSYN) IVPB 3.375 g  3.375 g Intravenous Q8H Salary, Montell D, MD 12.5 mL/hr at 01/06/18 0749 3.375 g at 01/06/18 0749  . sodium chloride flush (NS) 0.9 % injection 3 mL  3 mL Intravenous Q12H Salary, Montell D, MD   3 mL at 01/05/18 2223  . sodium chloride flush (NS) 0.9 % injection 3 mL  3 mL Intravenous PRN Salary, Avel Peace, MD      . umeclidinium-vilanterol (  ANORO ELLIPTA) 62.5-25 MCG/INH 1 puff  1 puff Inhalation Daily Salary, Montell D, MD   1 puff at 01/06/18 0838  . vancomycin (VANCOCIN) IVPB 750 mg/150 ml premix  750 mg Intravenous Q8H Salary, Montell D, MD   Stopped at 01/06/18 7846     Objective: Vital signs in last 24 hours: Temp:  [98.1 F (36.7 C)-98.4 F (36.9 C)] 98.3 F (36.8 C) (07/19 0428) Pulse Rate:  [60-84] 84 (07/19 0428) Resp:  [14-20] 20 (07/19 0428) BP: (97-126)/(51-80) 97/51 (07/19 0428) SpO2:  [96 %-98 %] 96 % (07/19 0428)  Intake/Output from previous day: 07/18 0701 - 07/19 0700 In: 920 [P.O.:480; IV Piggyback:440] Out: 3325 [Urine:3325] Intake/Output this shift: No intake/output data recorded.   Physical Exam: Based on prior marked border of erythema there has been significant decrease.  Indurated mass left groin without fluctuance.  Minimal erythema present, mild tenderness   Studies/Results: Ct Abdomen  Pelvis W Contrast  Result Date: 01/05/2018 CLINICAL DATA:  Acute swelling of the left groin, evaluate for abscess EXAM: CT ABDOMEN AND PELVIS WITH CONTRAST TECHNIQUE: Multidetector CT imaging of the abdomen and pelvis was performed using the standard protocol following bolus administration of intravenous contrast. CONTRAST:  176mL OMNIPAQUE IOHEXOL 300 MG/ML  SOLN COMPARISON:  CT abdomen pelvis of 01/03/2018 FINDINGS: Lower chest: Small nodular opacity peripherally in the lingula is stable. No pneumonia or effusion is seen. The heart is within normal limits in size. No pericardial effusion is seen. Hepatobiliary: There is differential perfusion of the liver with diminished perfusion throughout much of the right lobe. This could be due to geographic fatty infiltration. No other abnormality is seen. No calcified gallstones are seen. Pancreas: The pancreas is normal in size and the pancreatic duct is not dilated. There is a small low-attenuation area within the tail of the pancreas measuring 9 mm in diameter. Not definitely seen on the prior CT of a few days ago,, and a developing small pseudocyst cannot be excluded. Spleen: The spleen is unremarkable. Adrenals/Urinary Tract: The adrenal glands appear normal. The kidneys enhance with no evidence of mass or calculus. On delayed images, the pelvocaliceal systems are unremarkable. No hydronephrosis is seen. The ureters appear normal in caliber. The urinary bladder is not well distended but no abnormality is seen. Stomach/Bowel: The stomach is moderately distended with fluid and food debris without abnormality. No small bowel abnormality is seen. There is some feces throughout the colon. No lesion is evident. The appendix and terminal ileum are unremarkable. Vascular/Lymphatic: The abdominal aorta is normal in caliber with as noted previously, age advanced abdominal aortic atherosclerosis. A vascular variation of left-sided IVC is present. No adenopathy is seen.  Reproductive: The prostate is within upper limits of normal. Other: There is now a slightly more confluent area of soft tissue swelling and edema in the left groin, pubic, and suprapubic regions. On image number 83 series 2 this confluent area measures 2.3 x 3.5 cm, being somewhat less distinct on the prior study. However no discrete drainable abscess is seen. There is cellulitis present diffusely within the soft tissues of the left groin, and pubic-suprapubic regions. No air is seen within the soft tissues. Musculoskeletal: The lumbar vertebrae are normal alignment with normal intervertebral disc spaces. IMPRESSION: 1. Little change in cellulitis/ inflammatory process within the l left groin and suprapubic-pubic region which is slightly more confluent currently but still not a definite abscess. 2. Fatty infiltration of the liver. 3. Advanced atherosclerotic changes of the abdominal aorta. 4. New 9  mm low-attenuation structure in the tail of the pancreas not definitely seen previously. Possible developing small pseudocyst. Correlate clinically. Consider MR if further assessment is warranted. 5. Normal variation of left-sided IVC. Electronically Signed   By: Ivar Drape M.D.   On: 01/05/2018 13:24     Assessment: Left groin cellulitis-improving.  No evidence of abscess formation  Plan: No indication for incision and drainage at this time.  Continue antibiotic therapy.   LOS: 2 days    Abbie Sons 01/06/2018

## 2018-01-07 MED ORDER — AMOXICILLIN-POT CLAVULANATE 875-125 MG PO TABS: 1.0000 | ORAL_TABLET | Freq: Two times a day (BID) | ORAL | 0 refills | Status: AC

## 2018-01-07 MED ORDER — IBUPROFEN 800 MG PO TABS: 800.0000 mg | ORAL_TABLET | Freq: Three times a day (TID) | ORAL | 0 refills | Status: DC | PRN

## 2018-01-07 MED ORDER — ACETAMINOPHEN 325 MG PO TABS: 650.0000 mg | ORAL_TABLET | Freq: Four times a day (QID) | ORAL | Status: DC | PRN

## 2018-01-07 NOTE — Progress Notes (Signed)
Brian Wolfe was admitted to the Hospital on 01/04/2018 and Discharged  01/07/2018 and should be excused from work/school   for 10  days starting 01/04/2018 , may return to work/school without any restrictions.  Call Dustin Flock MD with questions.  Dustin Flock M.D on 01/07/2018,at 8:41 AM  [place] at [facility]  [phone]

## 2018-01-07 NOTE — Discharge Summary (Signed)
Brian Wolfe, 57 y.o., DOB 09-01-1960, MRN [id number]. Admission date: 01/04/2018 Discharge Date 01/07/2018 Primary MD [name], NP Admitting Physician Gorden Harms, MD  Admission Diagnosis  Cellulitis of groin [L03.314]  Discharge Diagnosis   Active Problems: Acute left groin cellulitis Chronic GERD without esophagitis Chronic depression Chronic hypogonadism Chronic hyperlipidemia Nicotine abuse Possible liver cirrhosis needs outpatient follow-up with primary care provider and referral to [facility] Patient is a 57 year old male who presented with left groin swelling he was seen in the ER 2 days prior.  Prescribed antibiotics.  He continued to have pain and swelling therefore came to the emergency room.  He was admitted.  Started on IV antibiotics.  Due to persistent swelling I discussed the case with urology they recommended a repeat CT.  Which showed no drainable abscess.  She was treated with IV antibiotics and significant improvement in his swelling.  He will follow-up with outpatient urology to decide if any drainage needs to be done. Patient on the CT of his abdomen was noted to have early changes consistent with possible cirrhosis.  His hepatitis panel was negative including hepatitis B and C.  Patient will need outpatient follow-up with a formal primary care provider they will need to arrange GI follow-up.          Consults  urology  Significant Tests:  See full reports for all details     Ct Abdomen Pelvis W Contrast  Result Date: 01/05/2018 CLINICAL DATA:  Acute swelling of the left groin, evaluate for abscess EXAM: CT ABDOMEN AND PELVIS WITH CONTRAST TECHNIQUE: Multidetector CT imaging of the abdomen and pelvis was performed using the standard protocol following bolus administration of intravenous contrast. CONTRAST:  192mL OMNIPAQUE IOHEXOL 300 MG/ML  SOLN COMPARISON:  CT abdomen pelvis of  01/03/2018 FINDINGS: Lower chest: Small nodular opacity peripherally in the lingula is stable. No pneumonia or effusion is seen. The heart is within normal limits in size. No pericardial effusion is seen. Hepatobiliary: There is differential perfusion of the liver with diminished perfusion throughout much of the right lobe. This could be due to geographic fatty infiltration. No other abnormality is seen. No calcified gallstones are seen. Pancreas: The pancreas is normal in size and the pancreatic duct is not dilated. There is a small low-attenuation area within the tail of the pancreas measuring 9 mm in diameter. Not definitely seen on the prior CT of a few days ago,, and a developing small pseudocyst cannot be excluded. Spleen: The spleen is unremarkable. Adrenals/Urinary Tract: The adrenal glands appear normal. The kidneys enhance with no evidence of mass or calculus. On delayed images, the pelvocaliceal systems are unremarkable. No hydronephrosis is seen. The ureters appear normal in caliber. The urinary bladder is not well distended but no abnormality is seen. Stomach/Bowel: The stomach is moderately distended with fluid and food debris without abnormality. No small bowel abnormality is seen. There is some feces throughout the colon. No lesion is evident. The appendix and terminal ileum are unremarkable. Vascular/Lymphatic: The abdominal aorta is normal in caliber with as noted previously, age advanced abdominal aortic atherosclerosis. A vascular variation of left-sided IVC is present. No adenopathy is seen. Reproductive: The prostate is within upper limits of normal. Other: There is now a slightly more confluent area of soft tissue swelling and edema in the left groin, pubic, and suprapubic regions. On image number 83 series 2 this confluent area measures 2.3 x 3.5  cm, being somewhat less distinct on the prior study. However no discrete drainable abscess is seen. There is cellulitis present diffusely within the  soft tissues of the left groin, and pubic-suprapubic regions. No air is seen within the soft tissues. Musculoskeletal: The lumbar vertebrae are normal alignment with normal intervertebral disc spaces. IMPRESSION: 1. Little change in cellulitis/ inflammatory process within the l left groin and suprapubic-pubic region which is slightly more confluent currently but still not a definite abscess. 2. Fatty infiltration of the liver. 3. Advanced atherosclerotic changes of the abdominal aorta. 4. New 9 mm low-attenuation structure in the tail of the pancreas not definitely seen previously. Possible developing small pseudocyst. Correlate clinically. Consider MR if further assessment is warranted. 5. Normal variation of left-sided IVC. Electronically Signed   By: Ivar Drape M.D.   On: 01/05/2018 13:24   Ct Abdomen Pelvis W Contrast  Result Date: 01/03/2018 CLINICAL DATA:  Left inguinal pain and swelling. EXAM: CT ABDOMEN AND PELVIS WITH CONTRAST TECHNIQUE: Multidetector CT imaging of the abdomen and pelvis was performed using the standard protocol following bolus administration of intravenous contrast. CONTRAST:  146mL OMNIPAQUE IOHEXOL 300 MG/ML  SOLN COMPARISON:  None. FINDINGS: Lower chest: Stable 5.5 mm subpleural pulmonary nodule in the lingula since prior study from 2010. No worrisome pulmonary lesions or acute pulmonary findings. Stable emphysematous changes. The heart is normal in size. Coronary artery calcifications are noted. Hepatobiliary: No focal hepatic lesions or intrahepatic biliary dilatation. Mild diffuse fatty infiltration of the liver. Slight irregularity of the hepatic surface and slight increased caudate to right lobe ratio may suggest mild changes of cirrhosis. There are also perigastric and perisplenic collateral vessels suggesting portal venous hypertension. The gallbladder appears normal. No common bile duct dilatation. Pancreas: No mass, inflammation or ductal dilatation. Spleen: Upper limits  of normal in size measuring 11.5 x 10.5 x 8.0 cm. No focal lesions. Adrenals/Urinary Tract: The adrenal glands and kidneys are unremarkable. No renal, ureteral or bladder calculi or mass. Stomach/Bowel: The stomach, duodenum, small bowel and colon are unremarkable. No acute inflammatory changes, mass lesions or obstructive findings. The terminal ileum and appendix are normal. Vascular/Lymphatic: Age advanced atherosclerotic calcifications involving the aorta and iliac arteries. No aneurysm or dissection. The branch vessels are patent. The major venous structures are patent. Incidental left-sided IVC. No mesenteric or retroperitoneal mass or adenopathy. Reproductive: The prostate gland and seminal vesicles are unremarkable. Other: Diffuse subcutaneous soft tissue swelling/edema/fluid suggesting cellulitis in the pubic and suprapubic region. More focal inflammatory phlegmon noted in the left inguinal area adjacent to the left inguinal canal. I do not see a discrete drainable rim enhancing fluid collection. Some cellulitic changes also involving the base of the penis. Small scattered inguinal lymph nodes. Musculoskeletal: No significant bony findings. IMPRESSION: 1. Cellulitis and more focal area of phlegmonous inflammatory changes in the left groin area without discrete drainable abscess. 2. No acute intra-abdominal/pelvic inflammatory process or mass lesion or adenopathy. 3. Suspect mild/early cirrhotic changes involving the liver with portal venous hypertension and portal venous collaterals. No ascites and borderline splenic size. 4. Mild diffuse fatty infiltration of the liver. 5. Age advanced atherosclerotic calcifications involving the coronary arteries and abdominal aorta and iliac arteries. Electronically Signed   By: Marijo Sanes M.D.   On: 01/03/2018 14:00       Today   Subjective:   Theophilus Kinds patient doing better pain under much better control redness much improved Objective:   Blood pressure  110/74, pulse 63, temperature 98.4 F (  36.9 C), temperature source Oral, resp. rate 20, height 5\' 5"  (1.651 m), weight 72.6 kg (160 lb), SpO2 97 %.  .  Intake/Output Summary (Last 24 hours) at 01/07/2018 1110 Last data filed at 01/07/2018 1041 Gross per 24 hour  Intake 970 ml  Output 2600 ml  Net -1630 ml    Exam VITAL SIGNS: Blood pressure 110/74, pulse 63, temperature 98.4 F (36.9 C), temperature source Oral, resp. rate 20, height 5\' 5"  (1.651 m), weight 72.6 kg (160 lb), SpO2 97 %.  GENERAL:  57 y.o.-year-old patient lying in the bed with no acute distress.  EYES: Pupils equal, round, reactive to light and accommodation. No scleral icterus. Extraocular muscles intact.  HEENT: Head atraumatic, normocephalic. Oropharynx and nasopharynx clear.  NECK:  Supple, no jugular venous distention. No thyroid enlargement, no tenderness.  LUNGS: Normal breath sounds bilaterally, no wheezing, rales,rhonchi or crepitation. No use of accessory muscles of respiration.  CARDIOVASCULAR: S1, S2 normal. No murmurs, rubs, or gallops.  ABDOMEN: Soft, nontender, nondistended. Bowel sounds present. No organomegaly or mass.  EXTREMITIES: No pedal edema, cyanosis, or clubbing.  NEUROLOGIC: Cranial nerves II through XII are intact. Muscle strength 5/5 in all extremities. Sensation intact. Gait not checked.  PSYCHIATRIC: The patient is alert and oriented x 3.  SKIN: Left groin swelling persist but redness much Data Review     CBC w Diff:  Lab Results  Component Value Date   WBC 8.6 01/06/2018   HGB 13.1 01/06/2018   HGB 14.2 10/10/2017   HCT 37.8 (L) 01/06/2018   HCT 42.7 10/10/2017   PLT 195 01/06/2018   PLT 217 01/06/2015   LYMPHOPCT 22 01/06/2018   MONOPCT 8 01/06/2018   EOSPCT 4 01/06/2018   BASOPCT 1 01/06/2018   CMP:  Lab Results  Component Value Date   NA 139 01/04/2018   NA 141 01/06/2015   NA 141 05/04/2014   K 3.9 01/04/2018   K 3.7 05/04/2014   CL 107 01/04/2018   CL 105  05/04/2014   CO2 24 01/04/2018   CO2 29 05/04/2014   BUN 15 01/04/2018   BUN 16 01/06/2015   BUN 12 05/04/2014   CREATININE 0.84 01/04/2018   CREATININE 0.91 03/09/2017   PROT 7.6 01/03/2018   PROT 6.9 03/07/2017   ALBUMIN 4.0 01/03/2018   ALBUMIN 4.2 03/07/2017   BILITOT 1.4 (H) 01/03/2018   BILITOT 0.2 03/07/2017   ALKPHOS 79 01/03/2018   AST 22 01/03/2018   ALT 19 01/03/2018  .  Micro Results No results found for this or any previous visit (from the past 240 hour(s)).      Code Status Orders  (From admission, onward)        Start     Ordered   01/04/18 2120  Full code  Continuous     01/04/18 2120    Code Status History    This patient has a current code status but no historical code status.    Advance Directive Documentation     Most Recent Value  Type of Advance Directive  Healthcare Power of Attorney  Pre-existing out of facility DNR order (yellow form or pink MOST form)  -  "MOST" Form in Place?  -          Follow-up Information    Stoioff, Ronda Fairly, MD. Schedule an appointment as soon as possible for a visit on 01/09/2018.   Specialty:  Urology Why:  for groin infection Contact information: [address]  [place] [address] [phone]           Discharge Medications   Allergies as of 01/07/2018      Reactions   Bactrim [sulfamethoxazole-trimethoprim] Nausea And Vomiting      Medication List    TAKE these medications   acetaminophen 325 MG tablet Commonly known as:  TYLENOL Take 2 tablets (650 mg total) by mouth every 6 (six) hours as needed for mild pain (or Fever >/= 101).   albuterol 108 (90 Base) MCG/ACT inhaler Commonly known as:  PROVENTIL HFA;VENTOLIN HFA Inhale 2 puffs into the lungs every 6 (six) hours as needed for wheezing or shortness of breath.   amoxicillin-clavulanate 875-125 MG tablet Commonly known as:  AUGMENTIN Take 1 tablet by mouth 2 (two) times daily for 7 days.   atorvastatin 20 MG  tablet Commonly known as:  LIPITOR TAKE ONE (1) TABLET BY MOUTH EVERY DAY What changed:    how much to take  how to take this  when to take this  additional instructions   clindamycin 300 MG capsule Commonly known as:  CLEOCIN Take 1 capsule (300 mg total) by mouth 4 (four) times daily for 10 days.   co-enzyme Q-10 30 MG capsule Take 1 capsule (30 mg total) by mouth daily.   escitalopram 10 MG tablet Commonly known as:  LEXAPRO Take 1 tablet (10 mg total) by mouth daily.   ibuprofen 800 MG tablet Commonly known as:  ADVIL,MOTRIN Take 1 tablet (800 mg total) by mouth every 8 (eight) hours as needed.   omeprazole 20 MG capsule Commonly known as:  PRILOSEC TAKE 1 CAPSULE EVERY DAY   sildenafil 20 MG tablet Commonly known as:  REVATIO Take 3 to 5 tablets two hours before intercouse on an empty stomach.  Do not take with nitrates.   Testosterone 20.25 MG/1.25GM (1.62%) Gel Commonly known as:  ANDROGEL Apply 2 Act topically every morning.   umeclidinium-vilanterol 62.5-25 MCG/INH Aepb Commonly known as:  ANORO ELLIPTA INHALE ONE PUFF BY MOUTH DAILY. What changed:    how much to take  how to take this  when to take this  additional instructions          Total Time in preparing paper work, data evaluation and todays exam - 35 minutes  Dustin Flock M.D on 01/07/2018 at [facility]  [phone]

## 2018-01-07 NOTE — Progress Notes (Signed)
Brian Wolfe  A and O x 4. VSS. Pt tolerating diet well. No complaints of pain or nausea. IV removed intact, prescriptions given. Pt voiced understanding of discharge instructions with no further questions. Pt discharged via wheelchair with nurse tech.     Allergies as of 01/07/2018      Reactions   Bactrim [sulfamethoxazole-trimethoprim] Nausea And Vomiting      Medication List    TAKE these medications   acetaminophen 325 MG tablet Commonly known as:  TYLENOL Take 2 tablets (650 mg total) by mouth every 6 (six) hours as needed for mild pain (or Fever >/= 101).   albuterol 108 (90 Base) MCG/ACT inhaler Commonly known as:  PROVENTIL HFA;VENTOLIN HFA Inhale 2 puffs into the lungs every 6 (six) hours as needed for wheezing or shortness of breath.   amoxicillin-clavulanate 875-125 MG tablet Commonly known as:  AUGMENTIN Take 1 tablet by mouth 2 (two) times daily for 7 days.   atorvastatin 20 MG tablet Commonly known as:  LIPITOR TAKE ONE (1) TABLET BY MOUTH EVERY DAY What changed:    how much to take  how to take this  when to take this  additional instructions   clindamycin 300 MG capsule Commonly known as:  CLEOCIN Take 1 capsule (300 mg total) by mouth 4 (four) times daily for 10 days.   co-enzyme Q-10 30 MG capsule Take 1 capsule (30 mg total) by mouth daily.   escitalopram 10 MG tablet Commonly known as:  LEXAPRO Take 1 tablet (10 mg total) by mouth daily.   ibuprofen 800 MG tablet Commonly known as:  ADVIL,MOTRIN Take 1 tablet (800 mg total) by mouth every 8 (eight) hours as needed.   omeprazole 20 MG capsule Commonly known as:  PRILOSEC TAKE 1 CAPSULE EVERY DAY   sildenafil 20 MG tablet Commonly known as:  REVATIO Take 3 to 5 tablets two hours before intercouse on an empty stomach.  Do not take with nitrates.   Testosterone 20.25 MG/1.25GM (1.62%) Gel Commonly known as:  ANDROGEL Apply 2 Act topically every morning.   umeclidinium-vilanterol  62.5-25 MCG/INH Aepb Commonly known as:  ANORO ELLIPTA INHALE ONE PUFF BY MOUTH DAILY. What changed:    how much to take  how to take this  when to take this  additional instructions       Vitals:   01/06/18 2003 01/07/18 0529  BP: 104/69 110/74  Pulse: 62 63  Resp: 18 20  Temp: 99.1 F (37.3 C) 98.4 F (36.9 C)  SpO2: 98% 97%    Francesco Sor
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
## 2018-01-10 ENCOUNTER — Encounter: Payer: Self-pay | Admitting: Urology

## 2018-01-10 ENCOUNTER — Ambulatory Visit: Payer: 59 | Admitting: Urology

## 2018-01-10 ENCOUNTER — Other Ambulatory Visit: Payer: Self-pay | Admitting: Radiology

## 2018-01-10 VITALS — BP 170/70 | HR 67 | Resp 16 | Ht 65.0 in | Wt 165.0 lb

## 2018-01-10 DIAGNOSIS — L02214 Cutaneous abscess of groin: Secondary | ICD-10-CM

## 2018-01-10 MED ORDER — CLINDAMYCIN PHOSPHATE 600 MG/50ML IV SOLN
600.0000 mg | INTRAVENOUS | Status: AC
  Administered 2018-01-11: 600 mg via INTRAVENOUS

## 2018-01-11 ENCOUNTER — Encounter: Payer: Self-pay | Admitting: *Deleted

## 2018-01-11 ENCOUNTER — Ambulatory Visit: Payer: 59 | Admitting: Anesthesiology

## 2018-01-11 ENCOUNTER — Ambulatory Visit
Admission: RE | Admit: 2018-01-11 | Discharge: 2018-01-11 | Disposition: A | Discharge status: Home / Self Care | Payer: 59 | Source: Ambulatory Visit | Attending: Urology | Admitting: Urology

## 2018-01-11 ENCOUNTER — Other Ambulatory Visit: Payer: Self-pay

## 2018-01-11 ENCOUNTER — Encounter
Admission: RE | Disposition: A | Discharge status: Home / Self Care | Payer: Self-pay | Source: Ambulatory Visit | Attending: Urology

## 2018-01-11 DIAGNOSIS — L02214 Cutaneous abscess of groin: Secondary | ICD-10-CM | POA: Insufficient documentation

## 2018-01-11 DIAGNOSIS — N138 Other obstructive and reflux uropathy: Secondary | ICD-10-CM | POA: Insufficient documentation

## 2018-01-11 DIAGNOSIS — F329 Major depressive disorder, single episode, unspecified: Secondary | ICD-10-CM | POA: Diagnosis not present

## 2018-01-11 DIAGNOSIS — Z8249 Family history of ischemic heart disease and other diseases of the circulatory system: Secondary | ICD-10-CM | POA: Insufficient documentation

## 2018-01-11 DIAGNOSIS — E291 Testicular hypofunction: Secondary | ICD-10-CM | POA: Insufficient documentation

## 2018-01-11 DIAGNOSIS — L02211 Cutaneous abscess of abdominal wall: Secondary | ICD-10-CM

## 2018-01-11 DIAGNOSIS — Z882 Allergy status to sulfonamides status: Secondary | ICD-10-CM | POA: Insufficient documentation

## 2018-01-11 DIAGNOSIS — K219 Gastro-esophageal reflux disease without esophagitis: Secondary | ICD-10-CM | POA: Insufficient documentation

## 2018-01-11 DIAGNOSIS — F17210 Nicotine dependence, cigarettes, uncomplicated: Secondary | ICD-10-CM | POA: Insufficient documentation

## 2018-01-11 DIAGNOSIS — Z79899 Other long term (current) drug therapy: Secondary | ICD-10-CM | POA: Diagnosis not present

## 2018-01-11 DIAGNOSIS — N401 Enlarged prostate with lower urinary tract symptoms: Secondary | ICD-10-CM | POA: Diagnosis not present

## 2018-01-11 DIAGNOSIS — F419 Anxiety disorder, unspecified: Secondary | ICD-10-CM | POA: Insufficient documentation

## 2018-01-11 HISTORY — PX: INCISION AND DRAINAGE ABSCESS: SHX5864

## 2018-01-11 SURGERY — INCISION AND DRAINAGE, ABSCESS
Anesthesia: General | Site: Groin | Laterality: Left | Wound class: Dirty or Infected

## 2018-01-11 MED ORDER — LIDOCAINE HCL (PF) 1 % IJ SOLN
INTRAMUSCULAR | Status: AC
  Filled 2018-01-11: qty 30

## 2018-01-11 MED ORDER — ACETAMINOPHEN 160 MG/5ML PO SOLN: 325.0000 mg | ORAL | Status: DC | PRN

## 2018-01-11 MED ORDER — CLINDAMYCIN PHOSPHATE 600 MG/50ML IV SOLN
INTRAVENOUS | Status: AC
  Filled 2018-01-11: qty 50

## 2018-01-11 MED ORDER — EPHEDRINE SULFATE 50 MG/ML IJ SOLN
INTRAMUSCULAR | Status: AC
Start: 2018-01-11 — End: ?
  Filled 2018-01-11: qty 1

## 2018-01-11 MED ORDER — NEOMYCIN-POLYMYXIN B GU 40-200000 IR SOLN
Status: DC | PRN
  Administered 2018-01-11: 2 mL

## 2018-01-11 MED ORDER — HYDROMORPHONE HCL 1 MG/ML IJ SOLN
INTRAMUSCULAR | Status: AC
  Administered 2018-01-11: 0.5 mg via INTRAVENOUS
  Filled 2018-01-11: qty 1

## 2018-01-11 MED ORDER — PROPOFOL 10 MG/ML IV BOLUS
INTRAVENOUS | Status: AC
  Filled 2018-01-11: qty 20

## 2018-01-11 MED ORDER — LACTATED RINGERS IV SOLN
INTRAVENOUS | Status: DC
  Administered 2018-01-11: 13:00:00 via INTRAVENOUS

## 2018-01-11 MED ORDER — FENTANYL CITRATE (PF) 100 MCG/2ML IJ SOLN
INTRAMUSCULAR | Status: AC
  Filled 2018-01-11: qty 2

## 2018-01-11 MED ORDER — HYDROMORPHONE HCL 1 MG/ML IJ SOLN
0.2500 mg | INTRAMUSCULAR | Status: DC | PRN
  Administered 2018-01-11 (×3): 0.5 mg via INTRAVENOUS

## 2018-01-11 MED ORDER — ONDANSETRON HCL 4 MG/2ML IJ SOLN
INTRAMUSCULAR | Status: DC | PRN
  Administered 2018-01-11: 4 mg via INTRAVENOUS

## 2018-01-11 MED ORDER — KETOROLAC TROMETHAMINE 30 MG/ML IJ SOLN
INTRAMUSCULAR | Status: AC
  Administered 2018-01-11: 30 mg via INTRAVENOUS
  Filled 2018-01-11: qty 1

## 2018-01-11 MED ORDER — HYDROCODONE-ACETAMINOPHEN 7.5-325 MG PO TABS
1.0000 | ORAL_TABLET | Freq: Once | ORAL | Status: AC | PRN
  Administered 2018-01-11: 1 via ORAL

## 2018-01-11 MED ORDER — HYDROMORPHONE HCL 1 MG/ML IJ SOLN
INTRAMUSCULAR | Status: AC
  Filled 2018-01-11: qty 1

## 2018-01-11 MED ORDER — HYDROCODONE-ACETAMINOPHEN 5-325 MG PO TABS: 1.0000 | ORAL_TABLET | Freq: Four times a day (QID) | ORAL | 0 refills | Status: DC | PRN

## 2018-01-11 MED ORDER — DEXAMETHASONE SODIUM PHOSPHATE 10 MG/ML IJ SOLN
INTRAMUSCULAR | Status: AC
  Filled 2018-01-11: qty 1

## 2018-01-11 MED ORDER — MEPERIDINE HCL 50 MG/ML IJ SOLN: 6.2500 mg | INTRAMUSCULAR | Status: DC | PRN

## 2018-01-11 MED ORDER — LIDOCAINE HCL (CARDIAC) PF 100 MG/5ML IV SOSY
PREFILLED_SYRINGE | INTRAVENOUS | Status: DC | PRN
  Administered 2018-01-11: 80 mg via INTRAVENOUS

## 2018-01-11 MED ORDER — HYDROCODONE-ACETAMINOPHEN 7.5-325 MG PO TABS
ORAL_TABLET | ORAL | Status: AC
  Filled 2018-01-11: qty 1

## 2018-01-11 MED ORDER — PROMETHAZINE HCL 25 MG/ML IJ SOLN: 6.2500 mg | INTRAMUSCULAR | Status: DC | PRN

## 2018-01-11 MED ORDER — ACETAMINOPHEN 325 MG PO TABS: 325.0000 mg | ORAL_TABLET | ORAL | Status: DC | PRN

## 2018-01-11 MED ORDER — PROPOFOL 10 MG/ML IV BOLUS
INTRAVENOUS | Status: DC | PRN
  Administered 2018-01-11: 200 mg via INTRAVENOUS

## 2018-01-11 MED ORDER — MIDAZOLAM HCL 2 MG/2ML IJ SOLN
INTRAMUSCULAR | Status: AC
  Filled 2018-01-11: qty 2

## 2018-01-11 MED ORDER — KETOROLAC TROMETHAMINE 30 MG/ML IJ SOLN
30.0000 mg | Freq: Once | INTRAMUSCULAR | Status: AC | PRN
  Administered 2018-01-11: 30 mg via INTRAVENOUS

## 2018-01-11 MED ORDER — MIDAZOLAM HCL 2 MG/2ML IJ SOLN
INTRAMUSCULAR | Status: DC | PRN
  Administered 2018-01-11: 2 mg via INTRAVENOUS

## 2018-01-11 MED ORDER — ONDANSETRON HCL 4 MG/2ML IJ SOLN
INTRAMUSCULAR | Status: AC
  Filled 2018-01-11: qty 2

## 2018-01-11 MED ORDER — DEXAMETHASONE SODIUM PHOSPHATE 10 MG/ML IJ SOLN
INTRAMUSCULAR | Status: DC | PRN
  Administered 2018-01-11: 10 mg via INTRAVENOUS

## 2018-01-11 MED ORDER — LIDOCAINE HCL (PF) 2 % IJ SOLN
INTRAMUSCULAR | Status: AC
  Filled 2018-01-11: qty 10

## 2018-01-11 MED ORDER — FENTANYL CITRATE (PF) 100 MCG/2ML IJ SOLN
INTRAMUSCULAR | Status: DC | PRN
  Administered 2018-01-11: 50 ug via INTRAVENOUS
  Administered 2018-01-11: 25 ug via INTRAVENOUS
  Administered 2018-01-11: 50 ug via INTRAVENOUS

## 2018-01-11 SURGICAL SUPPLY — 38 items
ADH SKN CLS APL DERMABOND .7 (GAUZE/BANDAGES/DRESSINGS) ×1
BNDG CMPR 75X21 PLY HI ABS (MISCELLANEOUS) ×1
CANISTER SUCT 1200ML W/VALVE (MISCELLANEOUS) ×3 IMPLANT
CHLORAPREP W/TINT 26ML (MISCELLANEOUS) ×3 IMPLANT
DERMABOND ADVANCED (GAUZE/BANDAGES/DRESSINGS) ×2
DERMABOND ADVANCED .7 DNX12 (GAUZE/BANDAGES/DRESSINGS) ×1 IMPLANT
DRAIN PENROSE 1/4X12 LTX (DRAIN) ×3 IMPLANT
DRAPE LAPAROTOMY 77X122 PED (DRAPES) ×3 IMPLANT
DRSG GAUZE FLUFF 36X18 (GAUZE/BANDAGES/DRESSINGS) ×3 IMPLANT
ELECT REM PT RETURN 9FT ADLT (ELECTROSURGICAL) ×3
ELECTRODE REM PT RTRN 9FT ADLT (ELECTROSURGICAL) ×1 IMPLANT
GAUZE PACKING IODOFORM 1/2 (PACKING) ×4 IMPLANT
GAUZE SPONGE 4X4 12PLY STRL (GAUZE/BANDAGES/DRESSINGS) ×3 IMPLANT
GAUZE STRETCH 2X75IN STRL (MISCELLANEOUS) ×3 IMPLANT
GLOVE BIO SURGEON STRL SZ 6.5 (GLOVE) ×2 IMPLANT
GLOVE BIO SURGEON STRL SZ7 (GLOVE) ×3 IMPLANT
GLOVE BIO SURGEONS STRL SZ 6.5 (GLOVE) ×1
GOWN STRL REUS W/ TWL LRG LVL3 (GOWN DISPOSABLE) ×2 IMPLANT
GOWN STRL REUS W/TWL LRG LVL3 (GOWN DISPOSABLE) ×6
KIT TURNOVER KIT A (KITS) ×3 IMPLANT
LABEL OR SOLS (LABEL) ×3 IMPLANT
NDL HYPO 25X1 1.5 SAFETY (NEEDLE) ×1 IMPLANT
NEEDLE HYPO 25X1 1.5 SAFETY (NEEDLE) ×3 IMPLANT
NS IRRIG 500ML POUR BTL (IV SOLUTION) ×3 IMPLANT
PACK BASIN MINOR ARMC (MISCELLANEOUS) ×3 IMPLANT
PAD ABD DERMACEA PRESS 5X9 (GAUZE/BANDAGES/DRESSINGS) ×2 IMPLANT
SOL PREP PVP 2OZ (MISCELLANEOUS) ×3
SOLUTION PREP PVP 2OZ (MISCELLANEOUS) ×1 IMPLANT
SUPPORETR ATHLETIC LG (MISCELLANEOUS) ×1 IMPLANT
SUPPORTER ATHLETIC LG (MISCELLANEOUS) ×3
SUT ETHILON 3-0 FS-10 30 BLK (SUTURE) ×3
SUT VIC AB 3-0 SH 27 (SUTURE) ×6
SUT VIC AB 3-0 SH 27X BRD (SUTURE) ×2 IMPLANT
SUT VIC AB 4-0 SH 27 (SUTURE) ×3
SUT VIC AB 4-0 SH 27XANBCTRL (SUTURE) ×1 IMPLANT
SUTURE EHLN 3-0 FS-10 30 BLK (SUTURE) ×1 IMPLANT
SWAB DUAL CULTURE TRANS RED ST (MISCELLANEOUS) ×2 IMPLANT
SYR 10ML LL (SYRINGE) ×3 IMPLANT

## 2018-01-11 NOTE — Discharge Instructions (Addendum)
Wound Packing- once daily Wound packing involves placing a moistened packing material into your wound and then covering it with an outer bandage (dressing). This helps promote proper healing of deep tissue and tissue under the skin. It also helps prevent bleeding, infection, and further injury. Wounds are packed until deep tissue has had time to heal. The time it takes for this to occur is different for everyone. Your health care provider will show you how to pack and dress your wound. Using gloves and a sterile technique is important in order to avoid spreading germs into your wound. What are the risks?  Infection.  Delayed or abnormal healing. How to pack your wound Follow your health care provider's instructions on how often you need to change dressings and pack your wound. You will likely be asked to change dressings 1-2 times a day. Supplies Needed  Gloves.  Wetting solution.  Clean bowl.  Packing material (gauze or gauze sponges).  Clean towels.  Outer dressing.  Tape.  Cotton balls or cotton-tipped swabs.  Small plastic bag. Preparing Your New Packing Material  1. Make sure your work surface or countertop is clean and disinfected. 2. Wash your hands well with soap and water. 3. Put a clean towel on the counter. 4. Put a clean bowl on the towel. Be sure to only touch the outside of the bowl when handling it. 5. Pour wetting solution into the bowl. 6. Cut your packing material (gauze or sponges) to the right size for your wound. Drop it into the bowl. 7. Cut four tape strips that you will use to seal the outer dressing. 8. Put cotton balls or cotton-tipped swabs on the clean towel. Removing the Old Packing and Dressing 1. Gently remove the old dressing and packing material. 2. Put the removed items into the plastic bag to throw away later. 3. Wash your hands well with soap and water again. Applying New Packing Material and Dressing 1. Put on your gloves. 2. Squeeze  the packing material in the bowl to release excess liquid. The packing material should be moist, but not dripping wet. 3. Gently place the packing material into the wound. Use a cotton ball or cotton-tipped swab to guide it into place, filling all of the space. 4. Dry your fingertips on the towel. 5. Open up your outer dressing supplies and put them on a dry part of the towel. Keep them from getting wet. 6. Place the dressing over the packed wound. 7. Tape the four outer edges of the dressing in place. 8. Remove your gloves. 9. Wash your hands again with soap and water. General Tips  Follow your health care provider's instructions on how tightly to pack the wound. At first, the wound will be packed tightly to help stop bleeding. As the wound begins to heal inside, you will use less packing material and pack the wound loosely to allow tissue to heal slowly from the inside out.  Keep the dressing clean and dry.  Follow any other instructions given by your health care provider on how to aid healing. This may include applying warm or cold compresses, elevating the affected area, or wearing a compression dressing.  Ask your health care provider about sun exposure and sunscreen when the dressings are no longer needed.  Keep all follow-up visits with your health care provider. This is important. Contact a health care provider if:  You have drainage, redness, swelling, or pain at your wound site.  You notice a bad smell coming  from the wound site.  Your pain is not controlled with pain medicine.  Tissue inside your wound changes color from pink to white, yellow, or black.  Your wound changes in size or depth.  You have a fever.  You have shaking chills.  You are having trouble packing your wound. This information is not intended to replace advice given to you by your health care provider. Make sure you discuss any questions you have with your health care provider. Document Released:  01/02/2014 Document Revised: 12/26/2015 Document Reviewed: 08/01/2013 Elsevier Interactive Patient Education  2018 [place]   1) The drugs that you were given will stay in your system until tomorrow so for the next 24 hours you should not:  A) Drive an automobile B) Make any legal decisions C) Drink any alcoholic beverage   2) You may resume regular meals tomorrow.  Today it is better to start with liquids and gradually work up to solid foods.  You may eat anything you prefer, but it is better to start with liquids, then soup and crackers, and gradually work up to solid foods.   3) Please notify your doctor immediately if you have any unusual bleeding, trouble breathing, redness and pain at the surgery site, drainage, fever, or pain not relieved by medication.    4) Additional Instructions:        Please contact your physician with any problems or Same Day Surgery at [phone], Monday through Friday 6 am to 4 pm, or [place] at [facility] number at [phone].
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
## 2018-01-11 NOTE — Anesthesia Procedure Notes (Signed)
Procedure Name: LMA Insertion Date/Time: 01/11/2018 4:01 PM Performed by: Hedda Slade, CRNA Pre-anesthesia Checklist: Patient identified, Patient being monitored, Timeout performed, Emergency Drugs available and Suction available Patient Re-evaluated:Patient Re-evaluated prior to induction Oxygen Delivery Method: Circle system utilized Preoxygenation: Pre-oxygenation with 100% oxygen Induction Type: IV induction Ventilation: Mask ventilation without difficulty LMA: LMA inserted LMA Size: 4.5 Tube type: Oral Number of attempts: 1 Placement Confirmation: positive ETCO2 and breath sounds checked- equal and bilateral Tube secured with: Tape Dental Injury: Teeth and Oropharynx as per pre-operative assessment

## 2018-01-11 NOTE — Op Note (Signed)
Date of procedure: 01/11/18  Preoperative diagnosis:  1. Left suprapubic abscess  Postoperative diagnosis:  1. Same as above  Procedure: 1. Incision and drainage of suprapubic abscess  Surgeon: [name], MD  Anesthesia: General  Complications: None  Intraoperative findings: Approximately 15 cc of purulent drainage from left suprapubic abscess drained   EBL: Minimal  Specimens: Wound culture  Drains: None  Indication: Brian Wolfe is a 57 y.o. patient with suprapubic tube abscess.  After reviewing the management options for treatment, he elected to proceed with the above surgical procedure(s). We have discussed the potential benefits and risks of the procedure, side effects of the proposed treatment, the likelihood of the patient achieving the goals of the procedure, and any potential problems that might occur during the procedure or recuperation. Informed consent has been obtained.  Description of procedure:  The patient was taken to the operating room and general anesthesia was induced.  The patient was placed in the supine position, prepped and draped in the usual sterile fashion, and preoperative antibiotics were administered. A preoperative time-out was performed.   A 15 blade was used to make a large cruciate incision overlying the fluctuant left suprapubic tube abscess site.  This was approximately 2 cm x 2 cm in size.  At this point time, a large amount of purulent material was drained, approximately 15 cc.  A wound culture was obtained.  The base of the wound was probed which extended through Scarpa's fascia.  It also tracked somewhat medially.  All loculations were broken up bluntly.  The wound was then copiously irrigated with antibiotic solution.  Finally, the wound was packed using half-inch iodoform packing gauze.  Additional dressing of 4 x 4's and a ABD pad was applied as well as supportive undergarments to hold this in place.  He was then reversed from  anesthesia and taken to PACU in stable condition.  Plan: Patient is wife are familiar with wound packing.  They will pack this daily.  He will follow-up next week for a wound check.  [name], M.D.

## 2018-01-11 NOTE — Anesthesia Postprocedure Evaluation (Signed)
Anesthesia Post Note  Patient: Chrystian Cupples Gildner  Procedure(s) Performed: INCISION AND DRAINAGE suprapubic ABSCESS (Left Groin)  Patient location during evaluation: PACU Anesthesia Type: General Level of consciousness: awake and alert and oriented Pain management: pain level controlled Vital Signs Assessment: post-procedure vital signs reviewed and stable Respiratory status: spontaneous breathing, nonlabored ventilation and respiratory function stable Cardiovascular status: blood pressure returned to baseline and stable Postop Assessment: no signs of nausea or vomiting Anesthetic complications: no     Last Vitals:  Vitals:   01/11/18 1731 01/11/18 1758  BP: 135/87 121/82  Pulse: 77 79  Resp: 18 16  Temp: 37 C 36.9 C  SpO2: 95% 95%    Last Pain:  Vitals:   01/11/18 1758  TempSrc: Temporal  PainSc: 4                  Rexine Gowens

## 2018-01-11 NOTE — Anesthesia Post-op Follow-up Note (Signed)
Anesthesia QCDR form completed.        

## 2018-01-11 NOTE — Transfer of Care (Signed)
Immediate Anesthesia Transfer of Care Note  Patient: Brian Wolfe  Procedure(s) Performed: INCISION AND DRAINAGE suprapubic ABSCESS (Left Groin)  Patient Location: PACU  Anesthesia Type:General  Level of Consciousness: sedated  Airway & Oxygen Therapy: Patient Spontanous Breathing and Patient connected to face mask oxygen  Post-op Assessment: Report given to RN and Post -op Vital signs reviewed and stable  Post vital signs: Reviewed and stable  Last Vitals:  Vitals Value Taken Time  BP    Temp 37 C 01/11/2018  4:38 PM  Pulse 80 01/11/2018  4:38 PM  Resp 10 01/11/2018  4:38 PM  SpO2 98 % 01/11/2018  4:38 PM    Last Pain:  Vitals:   01/11/18 1256  TempSrc: Oral  PainSc: 5       Patients Stated Pain Goal: 0 (88/71/95 9747)  Complications: No apparent anesthesia complications

## 2018-01-11 NOTE — Interval H&P Note (Signed)
History and Physical Interval Note:  01/11/2018 3:55 PM  Brian Wolfe  has presented today for surgery, with the diagnosis of left suprapubic abcess  The various methods of treatment have been discussed with the patient and family. After consideration of risks, benefits and other options for treatment, the patient has consented to  Procedure(s): INCISION AND DRAINAGE suprapubic ABSCESS (Left) as a surgical intervention .  The patient's history has been reviewed, patient examined, no change in status, stable for surgery.  I have reviewed the patient's chart and labs.  Questions were answered to the patient's satisfaction.    RRR CTAB   [name]

## 2018-01-12 ENCOUNTER — Other Ambulatory Visit: Payer: Self-pay | Admitting: Nurse Practitioner

## 2018-01-12 ENCOUNTER — Encounter: Payer: Self-pay | Admitting: Urology

## 2018-01-12 DIAGNOSIS — K760 Fatty (change of) liver, not elsewhere classified: Secondary | ICD-10-CM

## 2018-01-12 DIAGNOSIS — Z860100 Personal history of colon polyps, unspecified: Secondary | ICD-10-CM | POA: Insufficient documentation

## 2018-01-12 DIAGNOSIS — K869 Disease of pancreas, unspecified: Secondary | ICD-10-CM | POA: Insufficient documentation

## 2018-01-12 DIAGNOSIS — R17 Unspecified jaundice: Secondary | ICD-10-CM | POA: Insufficient documentation

## 2018-01-12 DIAGNOSIS — L02214 Cutaneous abscess of groin: Secondary | ICD-10-CM | POA: Insufficient documentation

## 2018-01-16 ENCOUNTER — Encounter: Payer: Self-pay | Admitting: Urology

## 2018-01-16 LAB — AEROBIC/ANAEROBIC CULTURE W GRAM STAIN (SURGICAL/DEEP WOUND)

## 2018-01-16 LAB — AEROBIC/ANAEROBIC CULTURE (SURGICAL/DEEP WOUND)

## 2018-01-16 MED ORDER — HYDROCODONE-ACETAMINOPHEN 5-325 MG PO TABS: 1.0000 | ORAL_TABLET | Freq: Four times a day (QID) | ORAL | 0 refills | Status: DC | PRN

## 2018-01-16 MED ORDER — CLINDAMYCIN HCL 300 MG PO CAPS: 300.0000 mg | ORAL_CAPSULE | Freq: Four times a day (QID) | ORAL | 0 refills | Status: AC

## 2018-01-16 MED ORDER — CLINDAMYCIN HCL 300 MG PO CAPS: 300.0000 mg | ORAL_CAPSULE | Freq: Three times a day (TID) | ORAL | 0 refills | Status: DC
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
## 2018-01-18 ENCOUNTER — Ambulatory Visit (INDEPENDENT_AMBULATORY_CARE_PROVIDER_SITE_OTHER): Payer: 59 | Admitting: Urology

## 2018-01-18 ENCOUNTER — Encounter: Payer: Self-pay | Admitting: Urology

## 2018-01-18 VITALS — BP 126/80 | HR 65 | Ht 65.0 in | Wt 163.0 lb

## 2018-01-18 DIAGNOSIS — L02214 Cutaneous abscess of groin: Secondary | ICD-10-CM

## 2018-01-19 ENCOUNTER — Ambulatory Visit
Admission: RE | Admit: 2018-01-19 | Discharge: 2018-01-19 | Disposition: A | Discharge status: Home / Self Care | Payer: 59 | Source: Ambulatory Visit | Attending: Nurse Practitioner | Admitting: Nurse Practitioner

## 2018-01-19 DIAGNOSIS — K760 Fatty (change of) liver, not elsewhere classified: Secondary | ICD-10-CM | POA: Diagnosis not present
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
## 2018-01-26 ENCOUNTER — Encounter: Payer: Self-pay | Admitting: Urology

## 2018-01-26 ENCOUNTER — Ambulatory Visit (INDEPENDENT_AMBULATORY_CARE_PROVIDER_SITE_OTHER): Payer: 59 | Admitting: Urology

## 2018-01-26 VITALS — BP 127/80 | HR 72 | Ht 65.0 in | Wt 165.0 lb

## 2018-01-26 DIAGNOSIS — L02214 Cutaneous abscess of groin: Secondary | ICD-10-CM
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
## 2018-02-01 ENCOUNTER — Encounter: Payer: Self-pay | Admitting: Urology

## 2018-02-01 ENCOUNTER — Ambulatory Visit (INDEPENDENT_AMBULATORY_CARE_PROVIDER_SITE_OTHER): Payer: 59 | Admitting: Urology

## 2018-02-01 ENCOUNTER — Encounter: Payer: Self-pay | Admitting: Nurse Practitioner

## 2018-02-01 VITALS — BP 124/82 | HR 73 | Ht 65.0 in | Wt 166.6 lb

## 2018-02-01 DIAGNOSIS — R5383 Other fatigue: Secondary | ICD-10-CM | POA: Diagnosis not present

## 2018-02-01 DIAGNOSIS — L02214 Cutaneous abscess of groin: Secondary | ICD-10-CM

## 2018-02-02 LAB — CBC WITH DIFFERENTIAL/PLATELET
Basophils Absolute: 0.1 10*3/uL (ref 0.0–0.2)
Basos: 1 %
EOS (ABSOLUTE): 0.3 10*3/uL (ref 0.0–0.4)
EOS: 5 %
HEMATOCRIT: 41 % (ref 37.5–51.0)
HEMOGLOBIN: 13.4 g/dL (ref 13.0–17.7)
Immature Grans (Abs): 0 10*3/uL (ref 0.0–0.1)
Immature Granulocytes: 0 %
LYMPHS ABS: 2.4 10*3/uL (ref 0.7–3.1)
Lymphs: 38 %
MCH: 30.7 pg (ref 26.6–33.0)
MCHC: 32.7 g/dL (ref 31.5–35.7)
MCV: 94 fL (ref 79–97)
MONOCYTES: 6 %
Monocytes Absolute: 0.4 10*3/uL (ref 0.1–0.9)
Neutrophils Absolute: 3.1 10*3/uL (ref 1.4–7.0)
Neutrophils: 50 %
Platelets: 193 10*3/uL (ref 150–450)
RBC: 4.36 x10E6/uL (ref 4.14–5.80)
RDW: 13.3 % (ref 12.3–15.4)
WBC: 6.3 10*3/uL (ref 3.4–10.8)

## 2018-02-02 LAB — TSH: TSH: 0.658 u[IU]/mL (ref 0.450–4.500)

## 2018-02-02 LAB — TESTOSTERONE: Testosterone: 333 ng/dL (ref 264–916)
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
## 2018-02-09 ENCOUNTER — Encounter: Payer: Self-pay | Admitting: Urology

## 2018-02-09 ENCOUNTER — Ambulatory Visit (INDEPENDENT_AMBULATORY_CARE_PROVIDER_SITE_OTHER): Payer: 59 | Admitting: Urology

## 2018-02-09 ENCOUNTER — Encounter: Payer: 59 | Attending: Nurse Practitioner | Admitting: Dietician

## 2018-02-09 ENCOUNTER — Encounter: Payer: Self-pay | Admitting: Dietician

## 2018-02-09 VITALS — Ht 65.0 in | Wt 166.9 lb

## 2018-02-09 VITALS — BP 120/65 | HR 66 | Ht 65.0 in | Wt 165.0 lb

## 2018-02-09 DIAGNOSIS — L02214 Cutaneous abscess of groin: Secondary | ICD-10-CM

## 2018-02-09 DIAGNOSIS — Z713 Dietary counseling and surveillance: Secondary | ICD-10-CM | POA: Diagnosis not present

## 2018-02-09 DIAGNOSIS — K760 Fatty (change of) liver, not elsewhere classified: Secondary | ICD-10-CM | POA: Insufficient documentation

## 2018-02-09 DIAGNOSIS — R5383 Other fatigue: Secondary | ICD-10-CM

## 2018-02-09 NOTE — Patient Instructions (Signed)
   Use vegetables and fruits for snacks. Nuts and seeds, maybe mixed with dried fruit and/or whole grain dry cereal can be an easy and balanced healthy snack  Try to eat at least a small meal or snack every 3-5 hours during the day as much as possible. Eating regularly might help the liver release some fat and will keep blood sugar from dropping low.   Control portions of foods high in animal fat such as red meats, processed meats (hot dogs, bacon, sausage, bologna, pepperoni, etc.), and full-fat dairy foods like whole milk, cheese, butter.   Choose lowfat and lean meats; ideally mostly chicken, [place], or fish (baked/ grilled).   Continue to keep sugar intake low from foods and drinks, drink mostly water or lightly flavored water.

## 2018-02-09 NOTE — Progress Notes (Signed)
Medical Nutrition Therapy: Visit start time: 1740  end time: 1150  Assessment:  Diagnosis: fatty liver Past medical history: GERD, hyperlipidemia, some episodes of hypoglycemia per patient Psychosocial issues/ stress concerns: history of depression, anxiety (taking medication)  Preferred learning method:  . Auditory  Current weight: 166.9lbs Height: 5'5" Medications, supplements: reconciled list in medical record  Progress and evaluation: Patient reports recent diagnosis of fatty liver from scan for cellulitis. Denies any GI/ abdominal symptoms. Feels he eats relatively healthy, stays active on the job. He reports having maintained a healthy weight for many years.  Physical activity: no regular exercise, does stay active in general  Dietary Intake:  Usual eating pattern includes 1-3 meals and 1-2 snacks per day. Dining out frequency: 3-4 meals per week.  Breakfast: usually none; keeps honey nut cheerios (large bowl with whole milk), honey bun, pop tart when able; rarely [employer] 2 eggs, hash browns, biscuit with gravy; if no time to eat, will drink an Ensure gradually diluted with whole milk.  Snack: none Lunch: ham bologna sandwiches, soups in cold weather -- sometimes none due to work schedule.  Snack: sometimes chips, or nuts Supper: sandwiches when on road; when home ham, grilled pork chop, beans, corn, green beans; steak and potato Snack: usually none, occasional popcorn 1-2 times a month; had watermelon recently Beverages: water, coffee sev cups daily, now 24oz; koolaid, gatorade, less sweet tea.   Nutrition Care Education: Topics covered: fatty liver Basic nutrition: basic food groups, appropriate nutrient balance, appropriate meal and snack schedule, general nutrition guidelines    Fatty liver: controlling sugar intake from foods and beverages, limiting high fat foods and saturated fat in particular-- discussed healthy and unhealthy fats; increasing vegetable and fruit intake;  eating at regular intervals; importance of staying active.   Nutritional Diagnosis:  [place]-1.4 Altered GI function As related to fatty liver.  As evidenced by scan and lab results and patient report.  Intervention: Instruction as noted above.   Patient voices difficulty in making some changes, such as lowfat milk. Encouraged manageable and gradual changes, and a few changes at a time.    Set goals with direction from patient.    No follow-up scheduled, encouraged patient to call as needed with any questions or concerns.   Education Materials given:  Marland Kitchen Mediterranean diet overview ([place]) . Healthy and Unhealthy Fats (AHA) . Goals/ instructions   Learner/ who was taught:  . Patient  . Spouse/ partner   Level of understanding: Marland Kitchen Verbalizes/ demonstrates competency   Demonstrated degree of understanding via:   Teach back Learning barriers: . None  Willingness to learn/ readiness for change: . Acceptance, ready for some change   Monitoring and Evaluation:  Dietary intake, exercise, liver health, and body weight      follow up: prn

## 2018-02-23 ENCOUNTER — Ambulatory Visit (INDEPENDENT_AMBULATORY_CARE_PROVIDER_SITE_OTHER): Payer: 59 | Admitting: Urology

## 2018-02-23 ENCOUNTER — Encounter: Payer: Self-pay | Admitting: Urology

## 2018-02-23 VITALS — BP 143/87 | HR 57 | Temp 97.5°F | Resp 14 | Ht 65.0 in | Wt 168.0 lb

## 2018-02-23 DIAGNOSIS — L02214 Cutaneous abscess of groin: Secondary | ICD-10-CM
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
## 2018-02-27 ENCOUNTER — Other Ambulatory Visit: Payer: Self-pay | Admitting: Urology

## 2018-03-03 ENCOUNTER — Telehealth: Payer: Self-pay | Admitting: Cardiovascular Disease

## 2018-03-03 NOTE — Telephone Encounter (Signed)
Spoke with patient wife, started to r/s new appointment with Dr. Rockey Situ on 10/18 Decided to clarify with husband first, will call back to r/s

## 2018-03-09 ENCOUNTER — Encounter: Payer: Self-pay | Admitting: Nurse Practitioner

## 2018-03-09 DIAGNOSIS — J410 Simple chronic bronchitis: Secondary | ICD-10-CM

## 2018-03-10 MED ORDER — ALBUTEROL SULFATE HFA 108 (90 BASE) MCG/ACT IN AERS: 2.0000 | INHALATION_SPRAY | Freq: Four times a day (QID) | RESPIRATORY_TRACT | 2 refills | Status: DC | PRN

## 2018-03-22 ENCOUNTER — Other Ambulatory Visit: Payer: Self-pay | Admitting: Nurse Practitioner

## 2018-03-22 DIAGNOSIS — F32A Depression, unspecified: Secondary | ICD-10-CM

## 2018-03-22 DIAGNOSIS — F329 Major depressive disorder, single episode, unspecified: Secondary | ICD-10-CM

## 2018-03-22 DIAGNOSIS — F419 Anxiety disorder, unspecified: Principal | ICD-10-CM

## 2018-03-25 ENCOUNTER — Other Ambulatory Visit: Payer: Self-pay | Admitting: Nurse Practitioner

## 2018-03-25 DIAGNOSIS — E782 Mixed hyperlipidemia: Secondary | ICD-10-CM

## 2018-04-02 ENCOUNTER — Encounter: Payer: Self-pay | Admitting: Nurse Practitioner

## 2018-04-02 DIAGNOSIS — F329 Major depressive disorder, single episode, unspecified: Secondary | ICD-10-CM

## 2018-04-02 DIAGNOSIS — F419 Anxiety disorder, unspecified: Principal | ICD-10-CM

## 2018-04-02 DIAGNOSIS — F32A Depression, unspecified: Secondary | ICD-10-CM

## 2018-04-03 ENCOUNTER — Encounter: Payer: Self-pay | Admitting: Nurse Practitioner

## 2018-04-03 DIAGNOSIS — E782 Mixed hyperlipidemia: Secondary | ICD-10-CM

## 2018-04-03 DIAGNOSIS — J410 Simple chronic bronchitis: Secondary | ICD-10-CM

## 2018-04-03 MED ORDER — ATORVASTATIN CALCIUM 20 MG PO TABS: ORAL_TABLET | ORAL | 0 refills | Status: DC

## 2018-04-03 MED ORDER — ESCITALOPRAM OXALATE 10 MG PO TABS: 10.0000 mg | ORAL_TABLET | Freq: Every day | ORAL | 0 refills | Status: DC

## 2018-04-03 MED ORDER — UMECLIDINIUM-VILANTEROL 62.5-25 MCG/INH IN AEPB: INHALATION_SPRAY | RESPIRATORY_TRACT | 0 refills | Status: DC

## 2018-04-05 ENCOUNTER — Other Ambulatory Visit: Payer: 59

## 2018-04-05 ENCOUNTER — Other Ambulatory Visit: Payer: Self-pay | Admitting: Family Medicine

## 2018-04-05 DIAGNOSIS — E291 Testicular hypofunction: Secondary | ICD-10-CM

## 2018-04-05 DIAGNOSIS — R972 Elevated prostate specific antigen [PSA]: Secondary | ICD-10-CM

## 2018-04-07 ENCOUNTER — Ambulatory Visit: Payer: 59 | Admitting: Cardiovascular Disease

## 2018-04-07 ENCOUNTER — Other Ambulatory Visit: Payer: 59

## 2018-04-07 DIAGNOSIS — E291 Testicular hypofunction: Secondary | ICD-10-CM

## 2018-04-07 DIAGNOSIS — R972 Elevated prostate specific antigen [PSA]: Secondary | ICD-10-CM

## 2018-04-08 LAB — PSA: PROSTATE SPECIFIC AG, SERUM: 2.9 ng/mL (ref 0.0–4.0)

## 2018-04-08 LAB — HEMOGLOBIN: Hemoglobin: 14.1 g/dL (ref 13.0–17.7)

## 2018-04-08 LAB — TESTOSTERONE: TESTOSTERONE: 276 ng/dL (ref 264–916)

## 2018-04-08 LAB — HEMATOCRIT: HEMATOCRIT: 41.7 % (ref 37.5–51.0)

## 2018-04-10 ENCOUNTER — Encounter
Admission: RE | Disposition: A | Discharge status: Home / Self Care | Payer: Self-pay | Source: Ambulatory Visit | Attending: Unknown Physician Specialty

## 2018-04-10 ENCOUNTER — Ambulatory Visit: Payer: 59 | Admitting: Anesthesiology

## 2018-04-10 ENCOUNTER — Encounter: Payer: Self-pay | Admitting: Emergency Medicine

## 2018-04-10 ENCOUNTER — Ambulatory Visit
Admission: RE | Admit: 2018-04-10 | Discharge: 2018-04-10 | Disposition: A | Discharge status: Home / Self Care | Payer: 59 | Source: Ambulatory Visit | Attending: Unknown Physician Specialty | Admitting: Unknown Physician Specialty

## 2018-04-10 DIAGNOSIS — Z79899 Other long term (current) drug therapy: Secondary | ICD-10-CM | POA: Insufficient documentation

## 2018-04-10 DIAGNOSIS — D123 Benign neoplasm of transverse colon: Secondary | ICD-10-CM | POA: Insufficient documentation

## 2018-04-10 DIAGNOSIS — Z7951 Long term (current) use of inhaled steroids: Secondary | ICD-10-CM | POA: Insufficient documentation

## 2018-04-10 DIAGNOSIS — D125 Benign neoplasm of sigmoid colon: Secondary | ICD-10-CM | POA: Diagnosis not present

## 2018-04-10 DIAGNOSIS — Z1211 Encounter for screening for malignant neoplasm of colon: Secondary | ICD-10-CM | POA: Insufficient documentation

## 2018-04-10 DIAGNOSIS — Z79890 Hormone replacement therapy (postmenopausal): Secondary | ICD-10-CM | POA: Insufficient documentation

## 2018-04-10 DIAGNOSIS — K219 Gastro-esophageal reflux disease without esophagitis: Secondary | ICD-10-CM | POA: Diagnosis not present

## 2018-04-10 DIAGNOSIS — Z882 Allergy status to sulfonamides status: Secondary | ICD-10-CM | POA: Diagnosis not present

## 2018-04-10 DIAGNOSIS — D122 Benign neoplasm of ascending colon: Secondary | ICD-10-CM | POA: Diagnosis not present

## 2018-04-10 DIAGNOSIS — Z833 Family history of diabetes mellitus: Secondary | ICD-10-CM | POA: Insufficient documentation

## 2018-04-10 DIAGNOSIS — Z8042 Family history of malignant neoplasm of prostate: Secondary | ICD-10-CM | POA: Insufficient documentation

## 2018-04-10 DIAGNOSIS — F329 Major depressive disorder, single episode, unspecified: Secondary | ICD-10-CM | POA: Insufficient documentation

## 2018-04-10 DIAGNOSIS — Z86010 Personal history of colonic polyps: Secondary | ICD-10-CM | POA: Diagnosis present

## 2018-04-10 DIAGNOSIS — K640 First degree hemorrhoids: Secondary | ICD-10-CM | POA: Insufficient documentation

## 2018-04-10 DIAGNOSIS — F17210 Nicotine dependence, cigarettes, uncomplicated: Secondary | ICD-10-CM | POA: Diagnosis not present

## 2018-04-10 DIAGNOSIS — Z8249 Family history of ischemic heart disease and other diseases of the circulatory system: Secondary | ICD-10-CM | POA: Diagnosis not present

## 2018-04-10 DIAGNOSIS — F419 Anxiety disorder, unspecified: Secondary | ICD-10-CM | POA: Diagnosis not present

## 2018-04-10 HISTORY — DX: Fatty (change of) liver, not elsewhere classified: K76.0

## 2018-04-10 HISTORY — PX: COLONOSCOPY WITH PROPOFOL: SHX5780

## 2018-04-10 HISTORY — DX: Anxiety disorder, unspecified: F41.9

## 2018-04-10 SURGERY — COLONOSCOPY WITH PROPOFOL
Anesthesia: General

## 2018-04-10 MED ORDER — SODIUM CHLORIDE 0.9 % IV SOLN: INTRAVENOUS | Status: DC

## 2018-04-10 MED ORDER — FENTANYL CITRATE (PF) 100 MCG/2ML IJ SOLN
INTRAMUSCULAR | Status: AC
  Filled 2018-04-10: qty 2

## 2018-04-10 MED ORDER — LIDOCAINE HCL (CARDIAC) PF 100 MG/5ML IV SOSY
PREFILLED_SYRINGE | INTRAVENOUS | Status: DC | PRN
  Administered 2018-04-10: 30 mg via INTRAVENOUS

## 2018-04-10 MED ORDER — MIDAZOLAM HCL 2 MG/2ML IJ SOLN
INTRAMUSCULAR | Status: DC | PRN
  Administered 2018-04-10: 2 mg via INTRAVENOUS

## 2018-04-10 MED ORDER — PROPOFOL 500 MG/50ML IV EMUL
INTRAVENOUS | Status: AC
  Filled 2018-04-10: qty 50

## 2018-04-10 MED ORDER — MIDAZOLAM HCL 2 MG/2ML IJ SOLN
INTRAMUSCULAR | Status: AC
  Filled 2018-04-10: qty 2

## 2018-04-10 MED ORDER — FENTANYL CITRATE (PF) 100 MCG/2ML IJ SOLN
INTRAMUSCULAR | Status: DC | PRN
  Administered 2018-04-10 (×2): 25 ug via INTRAVENOUS
  Administered 2018-04-10: 50 ug via INTRAVENOUS

## 2018-04-10 MED ORDER — SODIUM CHLORIDE 0.9 % IV SOLN
INTRAVENOUS | Status: DC
  Administered 2018-04-10: 1000 mL via INTRAVENOUS

## 2018-04-10 MED ORDER — PROPOFOL 500 MG/50ML IV EMUL
INTRAVENOUS | Status: DC | PRN
  Administered 2018-04-10: 120 ug/kg/min via INTRAVENOUS

## 2018-04-10 NOTE — Op Note (Signed)
[facility] Gastroenterology Patient Name: Brian Wolfe Procedure Date: 04/10/2018 2:00 PM MRN: [id number] Account #: [id number] Date of Birth: 1960/09/14 Admit Type: Outpatient Age: 57 Room: [facility] ENDO ROOM 3 Gender: Male Note Status: Finalized Procedure:            Colonoscopy Indications:          High risk colon cancer surveillance: Personal history                        of colonic polyps Providers:            Manya Silvas, MD Referring MD:         [name] (Referring MD) Medicines:            Propofol per Anesthesia Complications:        No immediate complications. Procedure:            Pre-Anesthesia Assessment:                       - After reviewing the risks and benefits, the patient                        was deemed in satisfactory condition to undergo the                        procedure.                       After obtaining informed consent, the colonoscope was                        passed under direct vision. Throughout the procedure,                        the patient's blood pressure, pulse, and oxygen                        saturations were monitored continuously. The                        Colonoscope was introduced through the anus and                        advanced to the the cecum, identified by appendiceal                        orifice and ileocecal valve. The colonoscopy was                        somewhat difficult due to significant looping and a                        tortuous colon. Successful completion of the procedure                        was aided by applying abdominal pressure. The patient                        tolerated the procedure well. The quality of the bowel  preparation was good. Findings:      A small polyp was found in the proximal ascending colon. The polyp was       sessile. The polyp was removed with a cold snare. Resection and       retrieval were complete.      A  diminutive polyp was found in the hepatic flexure. The polyp was       sessile. The polyp was removed with a hot snare. Resection and retrieval       were complete.      A small polyp was found in the transverse colon. The polyp was sessile.       The polyp was removed with a hot snare. Resection and retrieval were       complete.      A diminutive polyp was found in the sigmoid colon. The polyp was       sessile. The polyp was removed with a cold snare. Resection was       complete, but the polyp tissue was not retrieved.      Internal hemorrhoids were found during endoscopy. The hemorrhoids were       small and Grade I (internal hemorrhoids that do not prolapse). Impression:           - One small polyp in the proximal ascending colon,                        removed with a cold snare. Resected and retrieved.                       - One diminutive polyp at the hepatic flexure, removed                        with a hot snare. Resected and retrieved.                       - One small polyp in the transverse colon, removed with                        a hot snare. Resected and retrieved.                       - One diminutive polyp in the sigmoid colon, removed                        with a cold snare. Complete resection. Polyp tissue not                        retrieved.                       - Internal hemorrhoids. Recommendation:       - Await pathology results. Manya Silvas, MD 04/10/2018 2:40:33 PM This report has been signed electronically. Number of Addenda: 0 Note Initiated On: 04/10/2018 2:00 PM Scope Withdrawal Time: 0 hours 20 minutes 23 seconds  Total Procedure Duration: 0 hours 28 minutes 36 seconds       [facility]
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
## 2018-04-10 NOTE — Anesthesia Procedure Notes (Signed)
Performed by: Cook-Martin, Shivam Mestas Pre-anesthesia Checklist: Patient identified, Emergency Drugs available, Suction available, Patient being monitored and Timeout performed Patient Re-evaluated:Patient Re-evaluated prior to induction Oxygen Delivery Method: Nasal cannula Preoxygenation: Pre-oxygenation with 100% oxygen Induction Type: IV induction Placement Confirmation: CO2 detector and positive ETCO2       

## 2018-04-10 NOTE — Transfer of Care (Signed)
Immediate Anesthesia Transfer of Care Note  Patient: Brian Wolfe Lady  Procedure(s) Performed: COLONOSCOPY WITH PROPOFOL (N/A )  Patient Location: PACU  Anesthesia Type:General  Level of Consciousness: awake and sedated  Airway & Oxygen Therapy: Patient Spontanous Breathing  Post-op Assessment: Report given to RN and Post -op Vital signs reviewed and stable  Post vital signs: Reviewed and stable  Last Vitals:  Vitals Value Taken Time  BP    Temp    Pulse    Resp    SpO2      Last Pain:  Vitals:   04/10/18 1323  TempSrc: Tympanic  PainSc: 0-No pain         Complications: No apparent anesthesia complications

## 2018-04-10 NOTE — Anesthesia Post-op Follow-up Note (Signed)
Anesthesia QCDR form completed.        

## 2018-04-11 ENCOUNTER — Encounter: Payer: Self-pay | Admitting: Unknown Physician Specialty

## 2018-04-12 ENCOUNTER — Encounter: Payer: Self-pay | Admitting: Urology

## 2018-04-12 ENCOUNTER — Ambulatory Visit (INDEPENDENT_AMBULATORY_CARE_PROVIDER_SITE_OTHER): Payer: 59 | Admitting: Urology

## 2018-04-12 VITALS — BP 139/77 | HR 52 | Ht 65.0 in | Wt 167.3 lb

## 2018-04-12 DIAGNOSIS — N529 Male erectile dysfunction, unspecified: Secondary | ICD-10-CM | POA: Diagnosis not present

## 2018-04-12 DIAGNOSIS — E349 Endocrine disorder, unspecified: Secondary | ICD-10-CM

## 2018-04-12 DIAGNOSIS — Z87898 Personal history of other specified conditions: Secondary | ICD-10-CM

## 2018-04-12 DIAGNOSIS — N138 Other obstructive and reflux uropathy: Secondary | ICD-10-CM

## 2018-04-12 DIAGNOSIS — N401 Enlarged prostate with lower urinary tract symptoms: Secondary | ICD-10-CM

## 2018-04-12 LAB — SURGICAL PATHOLOGY

## 2018-04-12 MED ORDER — TESTOSTERONE 20.25 MG/1.25GM (1.62%) TD GEL: 2.0000 | Freq: Every morning | TRANSDERMAL | 5 refills | Status: DC

## 2018-04-12 NOTE — Anesthesia Postprocedure Evaluation (Signed)
Anesthesia Post Note  Patient: Brian Wolfe  Procedure(s) Performed: COLONOSCOPY WITH PROPOFOL (N/A )  Patient location during evaluation: Endoscopy Anesthesia Type: General Level of consciousness: awake and alert Pain management: pain level controlled Vital Signs Assessment: post-procedure vital signs reviewed and stable Respiratory status: spontaneous breathing, nonlabored ventilation and respiratory function stable Cardiovascular status: blood pressure returned to baseline and stable Postop Assessment: no apparent nausea or vomiting Anesthetic complications: no     Last Vitals:  Vitals:   04/10/18 1459 04/10/18 1509  BP: 127/81 129/73  Pulse: 60 (!) 54  Resp: 16 11  Temp:    SpO2: 96% 99%    Last Pain:  Vitals:   04/10/18 1459  TempSrc:   PainSc: 3                  Gioia Ranes Harvie Heck
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
## 2018-04-13 MED ORDER — TESTOSTERONE 20.25 MG/1.25GM (1.62%) TD GEL: 2.0000 | Freq: Every morning | TRANSDERMAL | 5 refills | Status: DC

## 2018-04-17 ENCOUNTER — Encounter: Payer: Self-pay | Admitting: Nurse Practitioner

## 2018-04-17 ENCOUNTER — Other Ambulatory Visit: Payer: Self-pay

## 2018-04-17 ENCOUNTER — Ambulatory Visit (INDEPENDENT_AMBULATORY_CARE_PROVIDER_SITE_OTHER): Payer: 59 | Admitting: Nurse Practitioner

## 2018-04-17 VITALS — BP 122/72 | HR 71 | Ht 65.0 in | Wt 170.2 lb

## 2018-04-17 DIAGNOSIS — E782 Mixed hyperlipidemia: Secondary | ICD-10-CM | POA: Diagnosis not present

## 2018-04-17 DIAGNOSIS — K219 Gastro-esophageal reflux disease without esophagitis: Secondary | ICD-10-CM

## 2018-04-17 DIAGNOSIS — M12812 Other specific arthropathies, not elsewhere classified, left shoulder: Secondary | ICD-10-CM | POA: Diagnosis not present

## 2018-04-17 DIAGNOSIS — F419 Anxiety disorder, unspecified: Secondary | ICD-10-CM

## 2018-04-17 DIAGNOSIS — J410 Simple chronic bronchitis: Secondary | ICD-10-CM | POA: Diagnosis not present

## 2018-04-17 DIAGNOSIS — F329 Major depressive disorder, single episode, unspecified: Secondary | ICD-10-CM

## 2018-04-17 MED ORDER — ATORVASTATIN CALCIUM 20 MG PO TABS: ORAL_TABLET | ORAL | 3 refills | Status: DC

## 2018-04-17 MED ORDER — ALBUTEROL SULFATE HFA 108 (90 BASE) MCG/ACT IN AERS: 2.0000 | INHALATION_SPRAY | Freq: Four times a day (QID) | RESPIRATORY_TRACT | 5 refills | Status: DC | PRN

## 2018-04-17 MED ORDER — ESCITALOPRAM OXALATE 10 MG PO TABS: 10.0000 mg | ORAL_TABLET | Freq: Every day | ORAL | 3 refills | Status: DC

## 2018-04-17 MED ORDER — PREDNISONE 10 MG PO TABS: ORAL_TABLET | ORAL | 0 refills | Status: DC

## 2018-04-17 MED ORDER — UMECLIDINIUM-VILANTEROL 62.5-25 MCG/INH IN AEPB: INHALATION_SPRAY | RESPIRATORY_TRACT | 3 refills | Status: DC

## 2018-04-17 MED ORDER — OMEPRAZOLE 20 MG PO CPDR: 20.0000 mg | DELAYED_RELEASE_CAPSULE | Freq: Every day | ORAL | 3 refills | Status: DC

## 2018-04-17 NOTE — Patient Instructions (Addendum)
Brian Wolfe,   Thank you for coming in to clinic today.  1. I recommend orthopedic referral for further workup and treatment of your left shoulder.  You have most likely gotten a partial or total rotator cuff tear of one of your rotator cuff muscles.  2. You have a rotator cuff muscle strain.  - Start taking Tylenol extra strength 1 to 2 tablets every 6-8 hours for aches or fever/chills for next few days as needed.  Do not take more than 3,000 mg in 24 hours from all medicines.  May take Ibuprofen as well if tolerated 400-800 mg every 8 hours as needed. May alternate tylenol and ibuprofen in same day. - Use heat and ice.  Apply this for 15 minutes at a time 6-8 times per day.   - Muscle rub with lidocaine, lidocaine patch, Biofreeze, or tiger balm for topical pain relief.  Avoid using this with heat and ice to avoid burns.  Start prednisone taper over 7 days Day 1-2: 60 mg, Day 3: 50 mg, Day 4: 40 mg; Day 5: 30 mg; Day 6 20 mg; Day 7: 10 mg then stop.  [facility] ([facility] Address: [address], [place], [place] [address] Hours:  9AM-5PM Phone: [phone]   Please schedule a follow-up appointment with Cassell Smiles, AGNP. Return in about 1 year (around 04/18/2019) for annual physical.  If you have any other questions or concerns, please feel free to call the clinic or send a message through [place]. You may also schedule an earlier appointment if necessary.  You will receive a survey after today's visit either digitally by e-mail or paper by [employer]. Your experiences and feedback matter to [place].  Please respond so we know how we are doing as we provide care for you.   Cassell Smiles, DNP, AGNP-BC Adult Gerontology Nurse Practitioner [facility], [facility]    Rotator Cuff Injury Rotator cuff injury is any type of injury to the set of muscles and tendons that make up the stabilizing unit of your shoulder. This unit holds the ball of  your upper arm bone (humerus) in the socket of your shoulder blade (scapula). What are the causes? Injuries to your rotator cuff most commonly come from sports or activities that cause your arm to be moved repeatedly over your head. Examples of this include throwing, weight lifting, swimming, or racquet sports. Long lasting (chronic) irritation of your rotator cuff can cause soreness and swelling (inflammation), bursitis, and eventual damage to your tendons, such as a tear (rupture). What are the signs or symptoms? Acute rotator cuff tear:  Sudden tearing sensation followed by severe pain shooting from your upper shoulder down your arm toward your elbow.  Decreased range of motion of your shoulder because of pain and muscle spasm.  Severe pain.  Inability to raise your arm out to the side because of pain and loss of muscle power (large tears).  Chronic rotator cuff tear:  Pain that usually is worse at night and may interfere with sleep.  Gradual weakness and decreased shoulder motion as the pain worsens.  Decreased range of motion.  Rotator cuff tendinitis:  Deep ache in your shoulder and the outside upper arm over your shoulder.  Pain that comes on gradually and becomes worse when lifting your arm to the side or turning it inward.  How is this diagnosed? Rotator cuff injury is diagnosed through a medical history, physical exam, and imaging exam. The medical history helps  determine the type of rotator cuff injury. Your health care provider will look at your injured shoulder, feel the injured area, and ask you to move your shoulder in different positions. X-ray exams typically are done to rule out other causes of shoulder pain, such as fractures. MRI is the exam of choice for the most severe shoulder injuries because the images show muscles and tendons. How is this treated? Chronic tear:  Medicine for pain, such as acetaminophen or ibuprofen.  Physical therapy and range-of-motion  exercises may be helpful in maintaining shoulder function and strength.  Steroid injections into your shoulder joint.  Surgical repair of the rotator cuff if the injury does not heal with noninvasive treatment.  Acute tear:  Anti-inflammatory medicines such as ibuprofen and naproxen to help reduce pain and swelling.  A sling to help support your arm and rest your rotator cuff muscles. Long-term use of a sling is not advised. It may cause significant stiffening of the shoulder joint.  Surgery may be considered within a few weeks, especially in younger, active people, to return the shoulder to full function.  Indications for surgical treatment include the following: ? Age younger than 73 years. ? Rotator cuff tears that are complete. ? Physical therapy, rest, and anti-inflammatory medicines have been used for 6-8 weeks, with no improvement. ? Employment or sporting activity that requires constant shoulder use.  Tendinitis:  Anti-inflammatory medicines such as ibuprofen and naproxen to help reduce pain and swelling.  A sling to help support your arm and rest your rotator cuff muscles. Long-term use of a sling is not advised. It may cause significant stiffening of the shoulder joint.  Severe tendinitis may require: ? Steroid injections into your shoulder joint. ? Physical therapy. ? Surgery.  Follow these instructions at home:  Apply ice to your injury: ? Put ice in a plastic bag. ? Place a towel between your skin and the bag. ? Leave the ice on for 20 minutes, 2-3 times a day.  If you have a shoulder immobilizer (sling and straps), wear it until told otherwise by your health care provider.  You may want to sleep on several pillows or in a recliner at night to lessen swelling and pain.  Only take over-the-counter or prescription medicines for pain, discomfort, or fever as directed by your health care provider.  Do simple hand squeezing exercises with a soft rubber ball to  decrease hand swelling. Contact a health care provider if:  Your shoulder pain increases, or new pain or numbness develops in your arm, hand, or fingers.  Your hand or fingers are colder than your other hand. Get help right away if:  Your arm, hand, or fingers are numb or tingling.  Your arm, hand, or fingers are increasingly swollen and painful, or they turn white or blue. This information is not intended to replace advice given to you by your health care provider. Make sure you discuss any questions you have with your health care provider. Document Released: 06/04/2000 Document Revised: 11/13/2015 Document Reviewed: 01/17/2013 Elsevier Interactive Patient Education  2018 [employer].

## 2018-04-17 NOTE — Progress Notes (Signed)
Subjective:    Patient ID: Brian Wolfe, male    DOB: November 27, 1960, 57 y.o.   MRN: [id number]  Brian Wolfe is a 57 y.o. male presenting on 04/17/2018 for COPD   HPI  COPD Patient reports regular use of maintenance inhaler.  More frequent use of albuterol during season changes.  Less use generally during winter.  Continues to smoke 1 ppd and has for 40 years.  Has been previously unable to quit smoking and does not wish to try medications again.  GERD - No pain with heartburn unless with trigger foods, OJ.  - No noted bood in stool  NAFLD Patient with prior fatty liver disease diagnosis.  Patient has no ongoing symptoms to report.  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  He does restock once chemicals are delivered.  Eats sandwiches, cans of soup, carrots, etc to avoid eating fast food/ fried foods frequently.  HYPERLIPIDEMIA Patient is taking atorvastatin 20 mg once daily and tolerates well without side effects.   - Pt denies changes in vision, chest tightness/pressure, palpitations, shortness of breath, leg pain while walking, leg or arm weakness, and sudden loss of speech or loss of consciousness.   ANXIETY AND DEPRESSION Patient reports stable moods without significant worsening.  Is tolerating medication well without side effects.  Notes less frequent irritability now.    Left arm pain Has had difficulty to get hoses on rack and arm is weak in shoulder.  Losing strength when it hurts.  Propped up position, worsens pain.  Milder in R shoulder.  Occasional neck pain.  Has had cervical spine surgery in 1995 with C3-C5 fusion.  Social History   Tobacco Use  . Smoking status: Current Every Day Smoker    Packs/day: 1.00    Years: 40.00    Pack years: 40.00    Types: Cigarettes  . Smokeless tobacco: Never Used  Substance  Use Topics  . Alcohol use: No    Alcohol/week: 0.0 standard drinks    Frequency: Never    Comment: rarely  . Drug use: No    Review of Systems Per HPI unless specifically indicated above     Objective:    BP 122/72   Pulse 71   Ht 5\' 5"  (1.651 m)   Wt 170 lb 3.2 oz (77.2 kg)   BMI 28.32 kg/m   Wt Readings from Last 3 Encounters:  04/17/18 170 lb 3.2 oz (77.2 kg)  04/12/18 167 lb 4.8 oz (75.9 kg)  04/10/18 165 lb (74.8 kg)    Physical Exam  Constitutional: He is oriented to person, place, and time. He appears well-developed and well-nourished. No distress.  HENT:  Head: Normocephalic and atraumatic.  Right Ear: External ear normal.  Left Ear: External ear normal.  Nose: Nose normal.  Mouth/Throat: Oropharynx is clear and moist.  Eyes: Pupils are equal, round, and reactive to light. Conjunctivae are normal.  Neck: Normal range of motion. Neck supple. No JVD present. No tracheal deviation present. No thyromegaly present.  Cardiovascular: Normal rate, regular rhythm, normal heart sounds and intact distal pulses. Exam reveals no gallop and no friction rub.  No murmur heard. Pulmonary/Chest: Effort normal. No respiratory distress. He has decreased breath sounds (throughout all lobes). He has no wheezes. He has no rhonchi. He has no rales.  Abdominal: Soft. Bowel sounds are normal. He exhibits no distension. There is no hepatosplenomegaly. There is no tenderness.  Musculoskeletal:       Right shoulder: Normal.       Left shoulder: He exhibits decreased range of motion.  LEFT Shoulder Inspection: Normal appearance bilateral symmetrical Palpation: Tender to palpation over anterolateral, shoulder. ROM: Limited active ROM forward flexion, abduction.  Normal internal / external rotation, but with pain.   Special Testing: Rotator cuff testing positive for weakness with supraspinatus full can and empty can test, O'brien's negative for labral pain, Hawkin's AC impingement negative for  pain Strength: Normal strength 4/5 flex/ext, ext rot / int rot, grip, 3/5 rotator cuff str testing. Neurovascular: Distally intact pulses, sensation to light touch  Lymphadenopathy:    He has no cervical adenopathy.  Neurological: He is alert and oriented to person, place, and time. No cranial nerve deficit.  Skin: Skin is warm and dry. Capillary refill takes less than 2 seconds.  Psychiatric: He has a normal mood and affect. His behavior is normal. Judgment and thought content normal. He expresses no homicidal and no suicidal ideation. He expresses no suicidal plans and no homicidal plans.  Nursing note and vitals reviewed.     Assessment & Plan:   Problem List Items Addressed This Visit      Respiratory   Simple chronic bronchitis (HCC) Stable on maintenance inhalers. Continues to be worsened by ongoing smoking.   - Patient now a candidate for Low-Dose CT scan for Lung cancer screening.  Plan: 1. Continue Anoro one inhalation daily 2. Continue prn albuterol. 3. Consider future PFT assess severity of COPD, suspect is worse than simple chronic bronchitis at this point. 4. Follow-up 1 year or sooner if needed.   Relevant Medications   albuterol (PROVENTIL HFA;VENTOLIN HFA) 108 (90 Base) MCG/ACT inhaler   umeclidinium-vilanterol (ANORO ELLIPTA) 62.5-25 MCG/INH AEPB     Digestive   GERD (gastroesophageal reflux disease) Stable today on exam.  No signs of regular breakthrough heartburn.  No signs and symptoms of bleeding.  Medications tolerated without side effects.  Continue at current doses.  Refills provided.  Check labs today. Followup 1 year.    Relevant Medications   omeprazole (PRILOSEC) 20 MG capsule     Other   Anxiety and depression Stable.  Patient with ability to manage emotions, has little irritability.  Denies SI/HI and has no plans to carry out if SI/HI arise. Continue Lexapro 10 mg once daily.  Follow-up 1 year or sooner if needed.   Relevant Medications    escitalopram (LEXAPRO) 10 MG tablet   Hyperlipidemia Stable without any new signs and symptoms of ASCVD or ASCVD events.  Takes statin and tolerates well without side effects. Continue.  Labs today.  Follow-up 1 year or sooner if needed.   Relevant Medications   atorvastatin (LIPITOR) 20 MG tablet    Other Visit Diagnoses    Rotator cuff arthropathy of left shoulder    -  Primary Consistent with subacute LEFT-shoulder bursitis vs rotator cuff tendinopathy with some reduced active ROM.  Some concern for partial muscle tear as he is having weakness. Known repetitive overhead/strenuous activity as likely etiology. 57 year old patient with likely underlying arthritis - No imaging on chart  Plan: 1. 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  Urgent visit to [facility] recommended for this afternoon.  No scheduled appointments available today. - May Start rx ibuprofen 400-800 mg two  to three times daily (with food) for 2 weeks, then as needed 2. May take Tylenol Ex Str 1-2 q 6 hr PRN 3. Relative rest but keep shoulder mobile, demonstrated ROM exercises, avoid heavy lifting 4. May try heating pad PRN 5. Follow-up 4-6 weeks if not improved for re-evaluation, consider referral to Physical Therapy, X-rays, and or subacromial steroid injection    Relevant Medications   predniSONE (DELTASONE) 10 MG tablet      Meds ordered this encounter  Medications  . albuterol (PROVENTIL HFA;VENTOLIN HFA) 108 (90 Base) MCG/ACT inhaler    Sig: Inhale 2 puffs into the lungs every 6 (six) hours as needed for wheezing or shortness of breath.    Dispense:  1 Inhaler    Refill:  5  . atorvastatin (LIPITOR) 20 MG tablet    Sig: TAKE ONE (1) TABLET EACH DAY    Dispense:  90 tablet    Refill:  3  . escitalopram (LEXAPRO) 10 MG tablet    Sig: Take 1 tablet (10 mg total) by mouth daily.    Dispense:  90 tablet    Refill:  3  . umeclidinium-vilanterol (ANORO ELLIPTA)  62.5-25 MCG/INH AEPB    Sig: INHALE ONE PUFF BY MOUTH DAILY.    Dispense:  90 each    Refill:  3  . omeprazole (PRILOSEC) 20 MG capsule    Sig: Take 1 capsule (20 mg total) by mouth daily.    Dispense:  90 capsule    Refill:  3    Order Specific Question:   Supervising Provider    Answer:   Olin Hauser [[id number]]  . predniSONE (DELTASONE) 10 MG tablet    Sig: Day 1-2 take 6 pills. Day 3 take 5 pills then reduce by 1 pill each day.    Dispense:  27 tablet    Refill:  0    Order Specific Question:   Supervising Provider    Answer:   Olin Hauser [[id number]]    Follow up plan: Return in about 1 year (around 04/18/2019) for annual physical.  Cassell Smiles, DNP, AGPCNP-BC Adult Gerontology Primary Care Nurse Practitioner [facility] 04/17/2018, 11:53 AM

## 2018-04-20 ENCOUNTER — Encounter: Payer: Self-pay | Admitting: Nurse Practitioner

## 2018-04-20 DIAGNOSIS — R05 Cough: Secondary | ICD-10-CM

## 2018-04-20 DIAGNOSIS — R059 Cough, unspecified: Secondary | ICD-10-CM

## 2018-04-21 ENCOUNTER — Other Ambulatory Visit: Payer: Self-pay | Admitting: Nurse Practitioner

## 2018-04-21 DIAGNOSIS — K760 Fatty (change of) liver, not elsewhere classified: Secondary | ICD-10-CM

## 2018-04-21 DIAGNOSIS — E782 Mixed hyperlipidemia: Secondary | ICD-10-CM

## 2018-04-21 DIAGNOSIS — K219 Gastro-esophageal reflux disease without esophagitis: Secondary | ICD-10-CM

## 2018-04-21 MED ORDER — BENZONATATE 100 MG PO CAPS: 100.0000 mg | ORAL_CAPSULE | Freq: Three times a day (TID) | ORAL | 0 refills | Status: DC | PRN

## 2018-05-04 ENCOUNTER — Encounter

## 2018-05-04 ENCOUNTER — Ambulatory Visit: Payer: 59 | Admitting: Cardiovascular Disease

## 2018-07-24 ENCOUNTER — Telehealth: Payer: Self-pay | Admitting: Radiology

## 2018-07-24 ENCOUNTER — Other Ambulatory Visit: Payer: Self-pay | Admitting: Radiology

## 2018-07-24 DIAGNOSIS — Z87898 Personal history of other specified conditions: Secondary | ICD-10-CM

## 2018-07-24 DIAGNOSIS — E349 Endocrine disorder, unspecified: Secondary | ICD-10-CM

## 2018-07-24 NOTE — Telephone Encounter (Signed)
Scheduled follow up appointment with [facility] with labs prior. Patient aware. Patient states he will have wife call to reschedule testosterone level lab that was due in November 2019.

## 2018-08-01 ENCOUNTER — Telehealth: Payer: Self-pay

## 2018-08-01 NOTE — Telephone Encounter (Signed)
Information sent to [facility] [phone] for patients testosterone

## 2018-10-04 ENCOUNTER — Encounter: Payer: Self-pay | Admitting: Family Medicine

## 2018-10-04 ENCOUNTER — Ambulatory Visit (INDEPENDENT_AMBULATORY_CARE_PROVIDER_SITE_OTHER): Payer: BLUE CROSS/BLUE SHIELD | Admitting: Family Medicine

## 2018-10-04 ENCOUNTER — Other Ambulatory Visit: Payer: Self-pay

## 2018-10-04 DIAGNOSIS — H6121 Impacted cerumen, right ear: Secondary | ICD-10-CM | POA: Diagnosis not present

## 2018-10-04 DIAGNOSIS — J019 Acute sinusitis, unspecified: Secondary | ICD-10-CM

## 2018-10-04 DIAGNOSIS — F17210 Nicotine dependence, cigarettes, uncomplicated: Secondary | ICD-10-CM | POA: Diagnosis not present

## 2018-10-04 NOTE — Patient Instructions (Addendum)
1. It sounds like you have persistent Sinus Congestion or "Rhinosinusitis" - I do not think that this is a Bacterial Sinus Infection. Usually these are caused by Viruses or Allergies, and will run it's course in about 7 to 10 days. - No antibiotics are needed at this time  Start nasal steroid Flonase 2 sprays in each nostril daily for 4-6 weeks, may repeat course seasonally or as needed  Start original [place] - have to ask pharmacist - show Driver's License to get  Start allergy pill OTC Loratadine (Claritin) 75m daily or Cetirizine (Zyrtec) 150mdaily - pick one for next 4-6 weeks, then you may stop and use seasonally or as needed  Keep using your ear wash kit to flush the wax out  May try mucinex if need  CALL [place] UPDATE IF NOT IMPROVED BY FRIDAY 10/06/18 - prefer around 8-9AM if possible I will be here in office until 12 noon - we can offer antibiotic if worsening or not improved - otherwise LaCassell SmilesAGPCNP-BC will be available.  If you get fever, coughing, short of breath - seek care more immediately for further evaluation.  May need Urgent Care for ear if not improved   Please schedule a Follow-up Appointment to: Return in about 1 week (around 10/11/2018), or if symptoms worsen or fail to improve, for sinus.  If you have any other questions or concerns, please feel free to call the office or send a message through [facility]aftYou may also schedule an earlier appointment if necessary.  Additionally, you may be receiving a survey about your experience at our office within a few days to 1 week by e-mail or mail. We value your feedback.  AlNobie PutnamDO [place]

## 2018-10-04 NOTE — Progress Notes (Signed)
Virtual Visit via Telephone The purpose of this virtual visit is to provide medical care while limiting exposure to the novel coronavirus (COVID19) for both patient and office staff.  Consent was obtained for phone visit:  Yes.   Answered questions that patient had about telehealth interaction:  Yes.   I discussed the limitations, risks, security and privacy concerns of performing an evaluation and management service by telephone. I also discussed with the patient that there may be a patient responsible charge related to this service. The patient expressed understanding and agreed to proceed.  Patient Location: Home Provider Location: [facility] (Office)  PCP is Cassell Smiles, AGPCNP-BC  ---------------------------------------------------------------------- Chief Complaint  Patient presents with  . Sinusitis    sinus pressure denies fever or SOB, onset week, HA  . Cerumen Impaction    Right side as per patient pain radiate from ear to the throat    S: Reviewed CMA documentation. I have called patient and gathered additional HPI as follows:  ACUTE SINUSITIS / R ear impaction Reports that symptoms started about 1-2 week ago with sinus congestion and he had Left sided ear pain and pressure with "clogged" ear wax, he used peroxide and ear wax drops with some resolution and symptoms resolved, then he now described worsening for past few days on R ear with similar clogged symptoms with pressure and fullness without significant pain. - Now worsening sinus symptoms, increased sinus congestion and pressure behind both eyes and nasal sinuses  - Tried OTC Sudafed PRN. 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 Admits travel to [place] driving. No other travel to high risk exposure to [place]. Home terminal is in [place] he was there for 2 days, and they are following all precautionary measures. Denies any known or suspected exposure to person with or possibly with COVID19.  Admits chronic unchanged smokers cough Denies any fevers, chills, sweats, body ache, cough, shortness of breath, abdominal pain, diarrhea  -------------------------------------------------------------------------- O: No physical exam performed due to remote telephone encounter.  -------------------------------------------------------------------------- A&P:  Suspected Acute Rhinosinusitis, possible allergic etiology vs or benign viral etiology at onset Duration only 2-3 days now worsening but not consistent with bacterial infection at this time - Complicated by cerumen impaction was L now resolved, now R using home removal kit worsening his sinus pressure symptoms - Chronic smoker, some COPD, with cough otherwise no significant concerning lower respiratory symptoms - Reassuring without high risk symptoms - Afebrile, without dyspnea - No comorbid pulmonary conditions (asthma, COPD) or immunocompromise  - Currently patient is LOW RISK for COVID19 based on current symptoms and no known travel/exposure - however at this time, now COVID19 is currently within phase of community spread, and therefore patient can still be potentially exposed. Cannot rule out case with mild symptoms. Testing is not recommended at this time due to mild symptoms.  1. Start (existing spray at home) nasal steroid Flonase 2 sprays in each nostril daily for 4-6 weeks, may repeat course seasonally or as needed - no new rx sent today 2. Start OTC anti histamine Loratadine or Cetirizine once daily 3. Trial on OTC Sudafed (behind counter, original formulation) for improved results 4.  May use  nasal saline 5. Limit allergy exposure 6. Continue R ear cerumen removal at home w/ kit and peroxide flush  If NOT improved or any worsening by 48 hours - Friday 10/06/18 he can contact our office and consider offering oral antibiotic - Doxycycline vs Azithromycin for sinusitis and also empiric coverage for Ear if worsened. Otherwise he was advised to go to Urgent Care if needs cerumen removal or ear examined.  No orders of the defined types were placed in this encounter.  OPTIONAL RECOMMENDED self quarantine for patient safety for PREVENTION ONLY. It is not required based on current clinical symptoms. If they were to develop fever or worsening shortness of breath, then emphasis on REQUIRED quarantine for up to 7-14 days that could be resolved if fever free >3 days AND if symptoms improving after 7 days.   If symptoms do not resolve or significantly improve OR if WORSENING - fever / cough - or worsening shortness of breath - then should contact us and seek advice on next steps in treatment at home vs where/when to seek care at Urgent Care or Hospital ED for further intervention and possible testing if indicated.  Patient verbalizes understanding with the above medical recommendations including the limitation of remote medical advice.  Specific follow-up / call-back criteria were given for patient to follow-up or seek medical care more urgently if needed.   - Time spent in direct consultation with patient on phone: 8 minutes   [name], [facility] 10/04/2018, 3:51 PM

## 2018-10-06 ENCOUNTER — Telehealth: Payer: Self-pay | Admitting: Family Medicine

## 2018-10-06 NOTE — Telephone Encounter (Signed)
Patient called back for follow-up advice as advised, last virtual visit 2 days ago, see note. He says his ear is much better now he was able to get wax cerumen out, he still has same sinus congestion, worse at night, but no fever, no significant productive purulent sinus drainage - I advised keep on same medicine, no changes, no antibiotic today, call back next week if worsening, reassurance given  [name], DO [facility] 10/06/2018, 11:49 AM

## 2018-10-08 ENCOUNTER — Encounter: Payer: Self-pay | Admitting: Nurse Practitioner

## 2018-10-08 DIAGNOSIS — J0110 Acute frontal sinusitis, unspecified: Secondary | ICD-10-CM
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
## 2018-10-09 ENCOUNTER — Ambulatory Visit: Payer: BLUE CROSS/BLUE SHIELD | Admitting: Urology

## 2018-10-09 ENCOUNTER — Other Ambulatory Visit: Payer: Self-pay

## 2018-10-09 ENCOUNTER — Encounter: Payer: Self-pay | Admitting: Urology

## 2018-10-09 VITALS — BP 139/88 | HR 69 | Ht 65.0 in | Wt 162.0 lb

## 2018-10-09 DIAGNOSIS — N401 Enlarged prostate with lower urinary tract symptoms: Secondary | ICD-10-CM | POA: Diagnosis not present

## 2018-10-09 DIAGNOSIS — N529 Male erectile dysfunction, unspecified: Secondary | ICD-10-CM

## 2018-10-09 DIAGNOSIS — Z87898 Personal history of other specified conditions: Secondary | ICD-10-CM | POA: Diagnosis not present

## 2018-10-09 DIAGNOSIS — E349 Endocrine disorder, unspecified: Secondary | ICD-10-CM | POA: Diagnosis not present

## 2018-10-09 DIAGNOSIS — N138 Other obstructive and reflux uropathy: Secondary | ICD-10-CM

## 2018-10-09 MED ORDER — DOXYCYCLINE HYCLATE 100 MG PO TABS: 100.0000 mg | ORAL_TABLET | Freq: Two times a day (BID) | ORAL | 0 refills | Status: DC

## 2018-10-09 MED ORDER — TESTOSTERONE 20.25 MG/1.25GM (1.62%) TD GEL: 2.0000 | Freq: Every morning | TRANSDERMAL | 5 refills | Status: DC

## 2018-10-09 MED ORDER — IPRATROPIUM BROMIDE 0.06 % NA SOLN: 1.0000 | Freq: Four times a day (QID) | NASAL | 0 refills | Status: DC

## 2018-10-09 NOTE — Telephone Encounter (Signed)
Brian Wolfe, Brian Wolfe is on his DPR - confirmed this - document has already been scanned into system, he is asking about LCSW updates with Joneen Boers at [employer] - I advised him updates that I was aware of and plan for LCSW to reach out to him this week - anticipated harold may DC this week to home with [facility] vs ALF  [name], [facility] 10/09/2018, 12:31 PM

## 2018-10-10 ENCOUNTER — Other Ambulatory Visit: Payer: 59

## 2018-10-10 ENCOUNTER — Other Ambulatory Visit: Payer: Self-pay | Admitting: Urology

## 2018-10-10 DIAGNOSIS — R972 Elevated prostate specific antigen [PSA]: Secondary | ICD-10-CM

## 2018-10-10 LAB — HEPATIC FUNCTION PANEL
ALT: 31 IU/L (ref 0–44)
AST: 18 IU/L (ref 0–40)
Albumin: 4.2 g/dL (ref 3.8–4.9)
Alkaline Phosphatase: 78 IU/L (ref 39–117)
Bilirubin Total: 0.3 mg/dL (ref 0.0–1.2)
Bilirubin, Direct: 0.11 mg/dL (ref 0.00–0.40)
Total Protein: 7 g/dL (ref 6.0–8.5)

## 2018-10-10 LAB — PSA: Prostate Specific Ag, Serum: 4.3 ng/mL — ABNORMAL HIGH (ref 0.0–4.0)

## 2018-10-10 LAB — HEMOGLOBIN: Hemoglobin: 15 g/dL (ref 13.0–17.7)

## 2018-10-10 LAB — HEMATOCRIT: Hematocrit: 44.6 % (ref 37.5–51.0)

## 2018-10-10 LAB — TESTOSTERONE: Testosterone: 278 ng/dL (ref 264–916)

## 2018-10-10 NOTE — Progress Notes (Unsigned)
Mr. Orama's PSA has increased.  I have put in orders for a prostate MRI.  I would like this done as sone as Mr. 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

## 2018-10-12 ENCOUNTER — Ambulatory Visit: Payer: 59 | Admitting: Urology

## 2018-10-15 ENCOUNTER — Other Ambulatory Visit: Payer: Self-pay

## 2018-10-15 ENCOUNTER — Ambulatory Visit
Admission: RE | Admit: 2018-10-15 | Discharge: 2018-10-15 | Disposition: A | Discharge status: Home / Self Care | Payer: BLUE CROSS/BLUE SHIELD | Source: Ambulatory Visit | Attending: Urology | Admitting: Urology

## 2018-10-15 DIAGNOSIS — R972 Elevated prostate specific antigen [PSA]: Secondary | ICD-10-CM

## 2018-10-15 LAB — POCT I-STAT CREATININE: Creatinine, Ser: 0.9 mg/dL (ref 0.61–1.24)

## 2018-10-15 IMAGING — MR MR PROSTATE WO/W CM
56 series · 56 of 56 positions shown · IV contrast (gadavist)
Comparison: None

CLINICAL DATA: 57-year-old male. History of elevated PSA greater
PSA 4.9. Two previous biopsy 1 core indicating high-grade PIN

EXAM:
MR PROSTATE WITHOUT AND WITH CONTRAST
TECHNIQUE: Multiplanar multisequence MRI images were obtained of the pelvis
centered about the prostate. Pre and post contrast images were
obtained.
CONTRAST:  7 mL Gadavist

[Series 3: T1 · axial · 8.0mm · 0.74mm/px · 1 of 25 slices shown (1 of 46)]
[im 1/25]
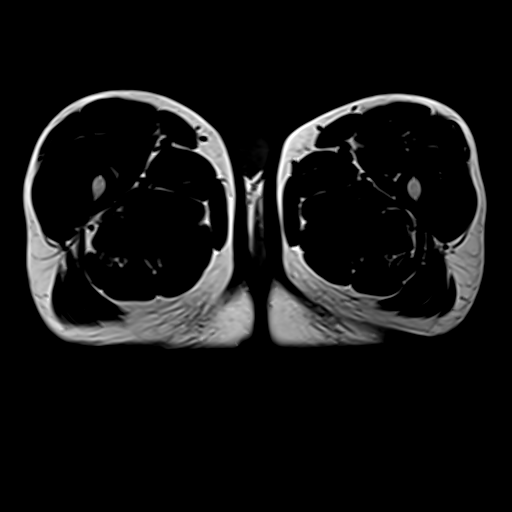

[Series 4: bSSFP fat-sat · axial · 8.0mm · 0.74mm/px · 1 of 25 slices shown]
[im 1/25]
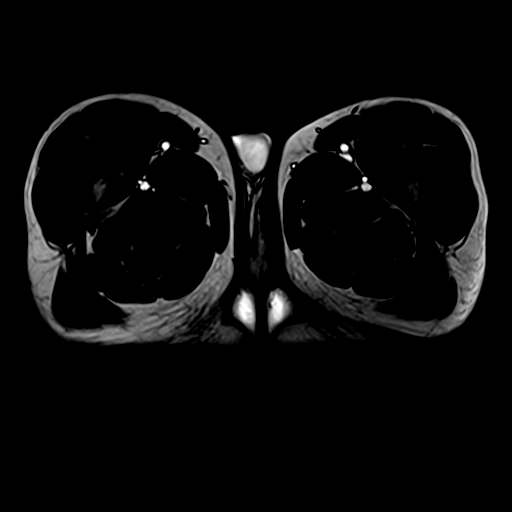

[Series 5: T2 · coronal · 3.0mm · 0.70mm/px · 1 of 30 slices shown (1 of 3)]
[im 1/30]
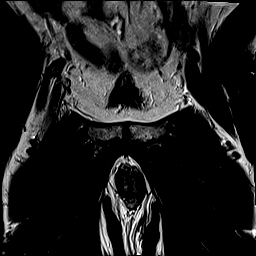

[Series 6: T2 · sagittal · 3.5mm · 0.62mm/px · 1 of 35 slices shown (2 of 3)]
[im 1/35]
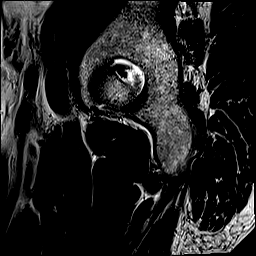

[Series 7: T1 · axial · 3.0mm · 0.35mm/px · 1 of 30 slices shown (2 of 46)]
[im 1/30]
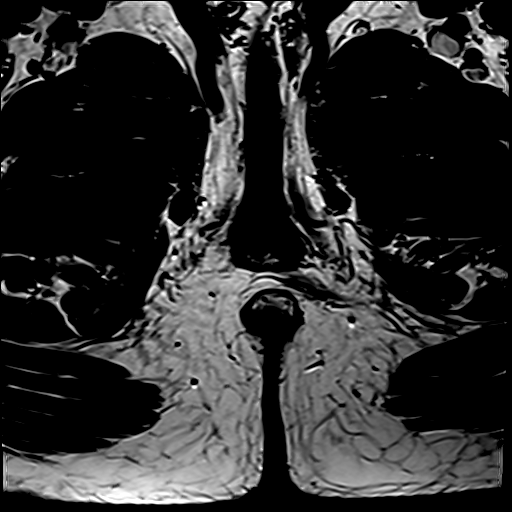

[Series 8: T2 · axial · 3.5mm · 0.56mm/px · 1 of 23 slices shown (3 of 3)]
[im 1/23]
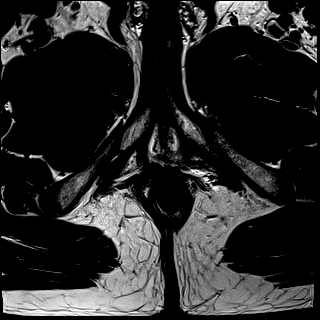

[Series 9: t2_space_tra (id) · axial · 1.0mm · 1.04mm/px · 1 of 80 slices shown]
[im 1/80]
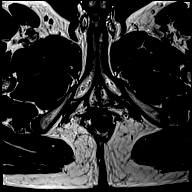

[Series 10: ax dwi_tracew · axial · 3.0mm · 0.78mm/px · 1 of 25 slices shown (1 of 3)]
[im 1/25]
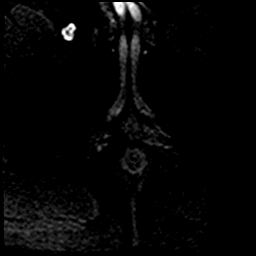

[Series 10: ax dwi_tracew · axial · 3.0mm · 0.78mm/px · 1 of 25 slices shown (2 of 3)]
[im 1/25]
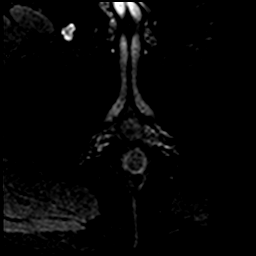

[Series 10: ax dwi_tracew · axial · 3.0mm · 0.78mm/px · 1 of 25 slices shown (3 of 3)]
[im 1/25]
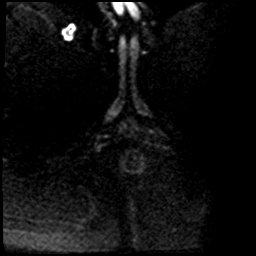

[Series 11: ax dwi_adc · axial · 3.0mm · 0.78mm/px · 1 of 25 slices shown]
[im 1/25]
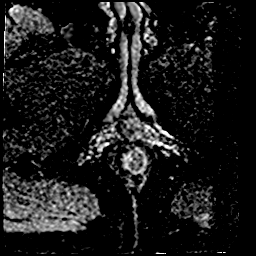

[Series 12: ax dwi_calc_bval · axial · 3.0mm · 0.78mm/px · 1 of 25 slices shown]
[im 1/25]
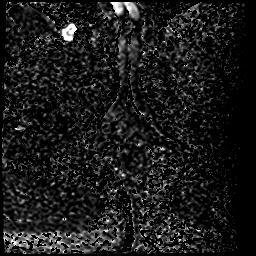

[Series 13: T1 · axial · 3.0mm · 1.15mm/px · 1 of 28 slices shown (3 of 46)]
[im 1/28]
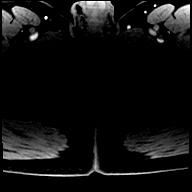

[Series 14: T1 · axial · 3.0mm · 1.15mm/px · 1 of 28 slices shown (4 of 46)]
[im 1/28]
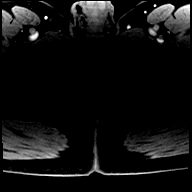

[Series 15: T1 · axial · 3.0mm · 1.15mm/px · 1 of 28 slices shown (5 of 46)]
[im 1/28]
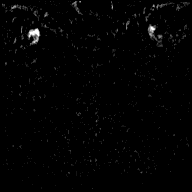

[Series 16: T1 · axial · 3.0mm · 1.15mm/px · 1 of 28 slices shown (6 of 46)]
[im 1/28]
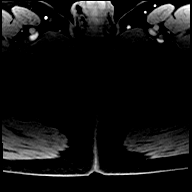

[Series 17: T1 · axial · 3.0mm · 1.15mm/px · 1 of 28 slices shown (7 of 46)]
[im 1/28]
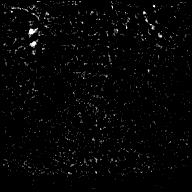

[Series 18: T1 · axial · 3.0mm · 1.15mm/px · 1 of 28 slices shown (8 of 46)]
[im 1/28]
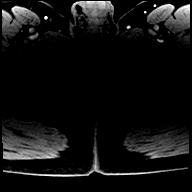

[Series 19: T1 · axial · 3.0mm · 1.15mm/px · 1 of 28 slices shown (9 of 46)]
[im 1/28]
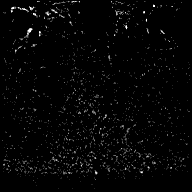

[Series 20: T1 · axial · 3.0mm · 1.15mm/px · 1 of 28 slices shown (10 of 46)]
[im 1/28]
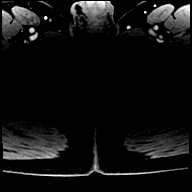

[Series 21: T1 · axial · 3.0mm · 1.15mm/px · 1 of 28 slices shown (11 of 46)]
[im 1/28]
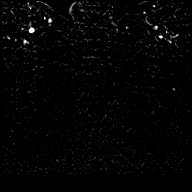

[Series 22: T1 · axial · 3.0mm · 1.15mm/px · 1 of 28 slices shown (12 of 46)]
[im 1/28]
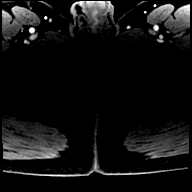

[Series 23: T1 · axial · 3.0mm · 1.15mm/px · 1 of 28 slices shown (13 of 46)]
[im 1/28]
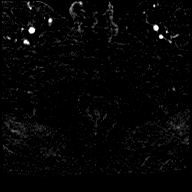

[Series 24: T1 · axial · 3.0mm · 1.15mm/px · 1 of 28 slices shown (14 of 46)]
[im 1/28]
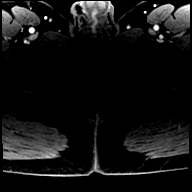

[Series 25: T1 · axial · 3.0mm · 1.15mm/px · 1 of 28 slices shown (15 of 46)]
[im 1/28]
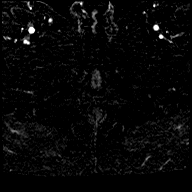

[Series 26: T1 · axial · 3.0mm · 1.15mm/px · 1 of 28 slices shown (16 of 46)]
[im 1/28]
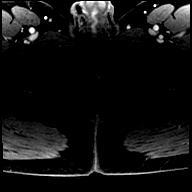

[Series 27: T1 · axial · 3.0mm · 1.15mm/px · 1 of 28 slices shown (17 of 46)]
[im 1/28]
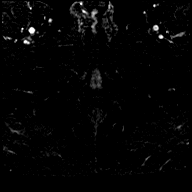

[Series 28: T1 · axial · 3.0mm · 1.15mm/px · 1 of 28 slices shown (18 of 46)]
[im 1/28]
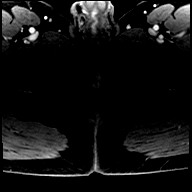

[Series 29: T1 · axial · 3.0mm · 1.15mm/px · 1 of 28 slices shown (19 of 46)]
[im 1/28]
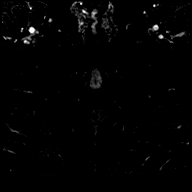

[Series 30: T1 · axial · 3.0mm · 1.15mm/px · 1 of 28 slices shown (20 of 46)]
[im 1/28]
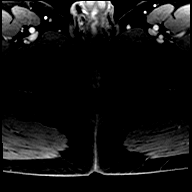

[Series 31: T1 · axial · 3.0mm · 1.15mm/px · 1 of 28 slices shown (21 of 46)]
[im 1/28]
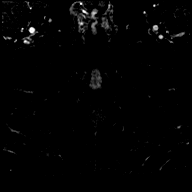

[Series 32: T1 · axial · 3.0mm · 1.15mm/px · 1 of 28 slices shown (22 of 46)]
[im 1/28]
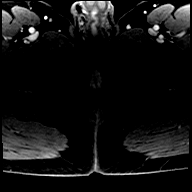

[Series 33: T1 · axial · 3.0mm · 1.15mm/px · 1 of 28 slices shown (23 of 46)]
[im 1/28]
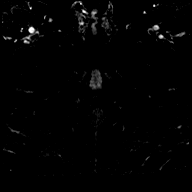

[Series 34: T1 · axial · 3.0mm · 1.15mm/px · 1 of 28 slices shown (24 of 46)]
[im 1/28]
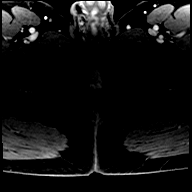

[Series 35: T1 · axial · 3.0mm · 1.15mm/px · 1 of 28 slices shown (25 of 46)]
[im 1/28]
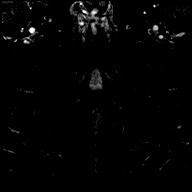

[Series 36: T1 · axial · 3.0mm · 1.15mm/px · 1 of 28 slices shown (26 of 46)]
[im 1/28]
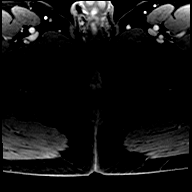

[Series 37: T1 · axial · 3.0mm · 1.15mm/px · 1 of 28 slices shown (27 of 46)]
[im 1/28]
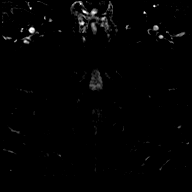

[Series 38: T1 · axial · 3.0mm · 1.15mm/px · 1 of 28 slices shown (28 of 46)]
[im 1/28]
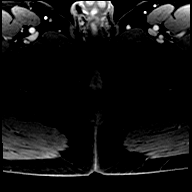

[Series 39: T1 · axial · 3.0mm · 1.15mm/px · 1 of 28 slices shown (29 of 46)]
[im 1/28]
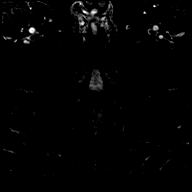

[Series 40: T1 · axial · 3.0mm · 1.15mm/px · 1 of 28 slices shown (30 of 46)]
[im 1/28]
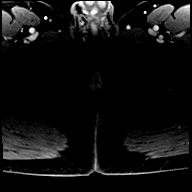

[Series 41: T1 · axial · 3.0mm · 1.15mm/px · 1 of 28 slices shown (31 of 46)]
[im 1/28]
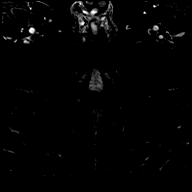

[Series 42: T1 · axial · 3.0mm · 1.15mm/px · 1 of 28 slices shown (32 of 46)]
[im 1/28]
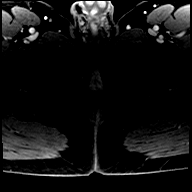

[Series 43: T1 · axial · 3.0mm · 1.15mm/px · 1 of 28 slices shown (33 of 46)]
[im 1/28]
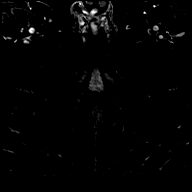

[Series 44: T1 · axial · 3.0mm · 1.15mm/px · 1 of 28 slices shown (34 of 46)]
[im 1/28]
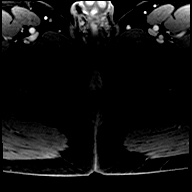

[Series 45: T1 · axial · 3.0mm · 1.15mm/px · 1 of 28 slices shown (35 of 46)]
[im 1/28]
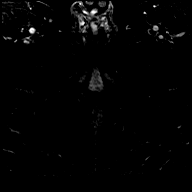

[Series 46: T1 · axial · 3.0mm · 1.15mm/px · 1 of 28 slices shown (36 of 46)]
[im 1/28]
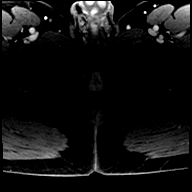

[Series 47: T1 · axial · 3.0mm · 1.15mm/px · 1 of 28 slices shown (37 of 46)]
[im 1/28]
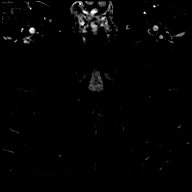

[Series 48: T1 · axial · 3.0mm · 1.15mm/px · 1 of 28 slices shown (38 of 46)]
[im 1/28]
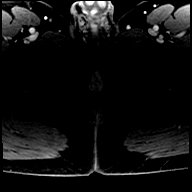

[Series 49: T1 · axial · 3.0mm · 1.15mm/px · 1 of 28 slices shown (39 of 46)]
[im 1/28]
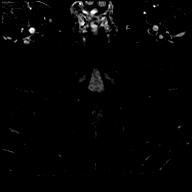

[Series 50: T1 · axial · 3.0mm · 1.15mm/px · 1 of 28 slices shown (40 of 46)]
[im 1/28]
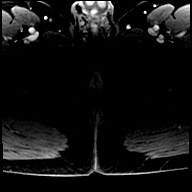

[Series 51: T1 · axial · 3.0mm · 1.15mm/px · 1 of 28 slices shown (41 of 46)]
[im 1/28]
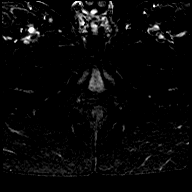

[Series 52: T1 · axial · 3.0mm · 1.15mm/px · 1 of 28 slices shown (42 of 46)]
[im 1/28]
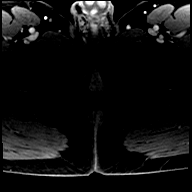

[Series 53: T1 · axial · 3.0mm · 1.15mm/px · 1 of 28 slices shown (43 of 46)]
[im 1/28]
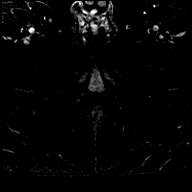

[Series 54: T1 · axial · 3.0mm · 1.15mm/px · 1 of 28 slices shown (44 of 46)]
[im 1/28]
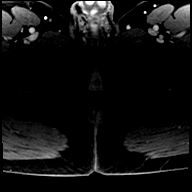

[Series 55: T1 · axial · 3.0mm · 1.15mm/px · 1 of 28 slices shown (45 of 46)]
[im 1/28]
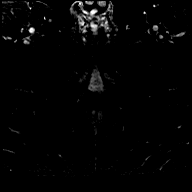

[Series 56: T1 · axial · 3.0mm · 1.15mm/px · 1 of 28 slices shown (46 of 46)]
[im 1/28]
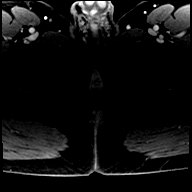

[56 of 56 positions shown; findings below may reference images not displayed]

FINDINGS: Prostate: Mild heterogeneity high signal within the peripheral zone
(series 8). No focal lesion. No foci of restricted diffusion within
the peripheral zone (series 11). Transitional zone is minimally
enlarged. No suspicious nodules.

Prostatic capsule is intact.  Seminal vesicles are normal.

Volume: 4.2 x  2.6 x 4.0 cm (volume = 23 cm^3)

Transcapsular spread:  Absent

Seminal vesicle involvement: Absent

Neurovascular bundle involvement: Absent

Pelvic adenopathy: Absent

Bone metastasis: Absent

Other findings: None
IMPRESSION: 1. No high-grade carcinoma within the peripheral zone.
2. Normal transitional zone.

## 2018-10-15 MED ORDER — GADOBUTROL 1 MMOL/ML IV SOLN
7.0000 mL | Freq: Once | INTRAVENOUS | Status: AC | PRN
  Administered 2018-10-15: 7 mL via INTRAVENOUS

## 2018-10-17 ENCOUNTER — Other Ambulatory Visit: Payer: Self-pay | Admitting: Urology

## 2018-10-17 ENCOUNTER — Telehealth: Payer: Self-pay

## 2018-10-17 MED ORDER — TESTOSTERONE 20.25 MG/1.25GM (1.62%) TD GEL: 2.0000 | Freq: Every morning | TRANSDERMAL | 5 refills | Status: DC

## 2018-10-17 NOTE — Telephone Encounter (Signed)
I'm pretty sure I can e-scribe the medication for him.  Which pharmacy is it?

## 2018-10-17 NOTE — Telephone Encounter (Signed)
Incoming fax from pt's pharmacy stating that the quantity is wrong on the original RX. Quantity needs to be changed to 75 grams. Can you change this and have RX e-scribed or do I need to have it printed? Thanks

## 2018-10-17 NOTE — Telephone Encounter (Signed)
[id number] - [place], [place] - [address]

## 2018-10-31 ENCOUNTER — Other Ambulatory Visit: Payer: Self-pay | Admitting: Family Medicine

## 2018-10-31 DIAGNOSIS — J0110 Acute frontal sinusitis, unspecified: Secondary | ICD-10-CM

## 2018-12-20 ENCOUNTER — Other Ambulatory Visit: Payer: BLUE CROSS/BLUE SHIELD

## 2018-12-25 ENCOUNTER — Encounter: Payer: Self-pay | Admitting: Nurse Practitioner

## 2018-12-28 ENCOUNTER — Other Ambulatory Visit: Payer: BLUE CROSS/BLUE SHIELD

## 2018-12-28 ENCOUNTER — Other Ambulatory Visit: Payer: Self-pay

## 2018-12-28 DIAGNOSIS — R972 Elevated prostate specific antigen [PSA]: Secondary | ICD-10-CM

## 2018-12-29 ENCOUNTER — Other Ambulatory Visit: Payer: Self-pay

## 2018-12-29 ENCOUNTER — Other Ambulatory Visit: Payer: BLUE CROSS/BLUE SHIELD

## 2018-12-29 DIAGNOSIS — R972 Elevated prostate specific antigen [PSA]: Secondary | ICD-10-CM

## 2018-12-29 DIAGNOSIS — Z20828 Contact with and (suspected) exposure to other viral communicable diseases: Secondary | ICD-10-CM | POA: Diagnosis not present

## 2018-12-30 LAB — PSA: Prostate Specific Ag, Serum: 4.2 ng/mL — ABNORMAL HIGH (ref 0.0–4.0)

## 2019-01-03 ENCOUNTER — Telehealth: Payer: Self-pay | Admitting: Family Medicine

## 2019-01-03 NOTE — Telephone Encounter (Signed)
Agree with triage. We do not have access to his COVID19 result. [place].  He should use precautions and isolate / self quarantine if symptoms. And seek care at hospital ED or urgent care if concerning symptoms.  [name], [facility] 01/03/2019, 11:59 AM

## 2019-01-03 NOTE — Telephone Encounter (Signed)
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 He has not heard about result yet but has Sx's of mild fever 99.9 F, hacky cough and SOB. Today he is asymptomatic and fever came back to normal. Advised patient that if his Sxs gets worst than he needs to go to ER for the evaluation but if he is asymptomatic then stay quarantine till he gets his test result and further advised after result.

## 2019-01-08 ENCOUNTER — Other Ambulatory Visit: Payer: BLUE CROSS/BLUE SHIELD

## 2019-03-19 ENCOUNTER — Other Ambulatory Visit: Payer: Self-pay | Admitting: Nurse Practitioner

## 2019-03-19 DIAGNOSIS — F32A Depression, unspecified: Secondary | ICD-10-CM

## 2019-03-19 DIAGNOSIS — K219 Gastro-esophageal reflux disease without esophagitis: Secondary | ICD-10-CM

## 2019-03-19 DIAGNOSIS — F329 Major depressive disorder, single episode, unspecified: Secondary | ICD-10-CM

## 2019-03-27 ENCOUNTER — Ambulatory Visit (INDEPENDENT_AMBULATORY_CARE_PROVIDER_SITE_OTHER): Payer: BLUE CROSS/BLUE SHIELD | Admitting: Nurse Practitioner

## 2019-03-27 ENCOUNTER — Other Ambulatory Visit: Payer: Self-pay

## 2019-03-27 ENCOUNTER — Encounter: Payer: Self-pay | Admitting: Nurse Practitioner

## 2019-03-27 VITALS — BP 138/86 | HR 71

## 2019-03-27 DIAGNOSIS — J0110 Acute frontal sinusitis, unspecified: Secondary | ICD-10-CM

## 2019-03-27 DIAGNOSIS — Z20822 Contact with and (suspected) exposure to covid-19: Secondary | ICD-10-CM

## 2019-03-27 DIAGNOSIS — Z20828 Contact with and (suspected) exposure to other viral communicable diseases: Secondary | ICD-10-CM

## 2019-03-27 MED ORDER — DOXYCYCLINE HYCLATE 100 MG PO TABS: 100.0000 mg | ORAL_TABLET | Freq: Two times a day (BID) | ORAL | 0 refills | Status: AC

## 2019-03-27 NOTE — Progress Notes (Signed)
Telemedicine Encounter: Disclosed to patient at start of encounter that we will provide appropriate telemedicine services.  Patient consents to be treated via phone prior to discussion. - Patient is at his home and is accessed via telephone. - Services are provided by Cassell Smiles from [facility].  Subjective:    Patient ID: Brian Wolfe, male    DOB: Apr 17, 1961, 58 y.o.   MRN: [id number]  Bernadette Tirella is a 58 y.o. male presenting on 03/27/2019 for Sinus Problem (coughing, dizziness with sudden movement, sneezing, mild headache and pressure sensation in the head x 10 days )  HPI Sinus congestion Symptoms started intermittently about 10 days ago.  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 When blowing nose, he got dizziness and felt like he would pass out.  Yesterday tried to help pack up his brother's house, and had to rest due to dizziness with movement. Congestion is not moving.  Cough is congested occasionally, not productive.  Headache intermittent as well.  Also head pressure - "like liquid in my head from my neck up." - Denies ear pressure, fullness, muffled hearing.  Occasionally feels like "I can't hear." - Patient denies fever (chills/sweats) - Patient does have significant fatigue.  Social History   Tobacco Use  . Smoking status: Current Every Day Smoker    Packs/day: 1.00    Years: 40.00    Pack years: 40.00    Types: Cigarettes  . Smokeless tobacco: Never Used  Substance Use Topics  . Alcohol use: No    Alcohol/week: 0.0 standard drinks    Frequency: Never  . Drug use: No    Review of Systems Per HPI unless specifically indicated above     Objective:    There were no vitals taken for this visit.  Wt Readings from Last 3 Encounters:  10/09/18 162 lb (73.5 kg)  04/17/18 170 lb 3.2 oz (77.2 kg)  04/12/18 167 lb 4.8 oz (75.9 kg)    Physical Exam Patient remotely monitored.  Verbal communication appropriate.  Cognition normal.    Results for orders placed or performed in visit on 12/29/18  PSA  Result Value Ref Range   Prostate Specific Ag, Serum 4.2 (H) 0.0 - 4.0 ng/mL      Assessment & Plan:   Problem List Items Addressed This Visit    None    Visit Diagnoses    Suspected COVID-19 virus infection    -  Primary   Acute non-recurrent frontal sinusitis       Relevant Medications   doxycycline (VIBRA-TABS) 100 MG tablet    Consistent with possible sinusitis vs Coronavirus with symptoms worsening over the past 7 days and initial symptoms of nasal congestion and sinus pressure 10 days ago.  Coronavirus suspected due to patient's description of fatigue, dizziness, nasal congestion, and cough.  Head pressure sensation suggests sinusitis.  Will test for Covid and treat empirically for sinusitis.  Plan: 1. START taking doxycycline 100 mg tablets every 12 hours for 10 days.  Discussed completing antibiotic. - While on antibiotic, take a probiotic OTC or from food. - Can use Flonase 2 sprays each nostril daily for up to 4-6 weeks if no epistaxis. - Start Mucinex-DM OTC for  7-10 days prn congestion 2. Supportive care with nasal saline, warm herbal tea with honey, 3. Improve hydration 4. Tylenol / Motrin PRN fevers  5. Go to have Covid testing today at [facility].  Letter for work written to delay next trip  out. 6. Return criteria given for prn worsening of symptoms   Meds ordered this encounter  Medications  . doxycycline (VIBRA-TABS) 100 MG tablet    Sig: Take 1 tablet (100 mg total) by mouth 2 (two) times daily for 10 days. For 7 days. Take with full glass of water, stay upright 30 min after taking.    Dispense:  20 tablet    Refill:  0    Order Specific Question:   Supervising Provider    Answer:   Olin Hauser [[id number]]    - Time spent in direct consultation with patient via telemedicine about above concerns: 7 minutes  Follow up plan: Prn 5-7 days if no improvement.  Cassell Smiles, DNP,  AGPCNP-BC Adult Gerontology Primary Care Nurse Practitioner [facility] 03/27/2019, 8:43 AM

## 2019-03-30 ENCOUNTER — Encounter: Payer: Self-pay | Admitting: Nurse Practitioner

## 2019-03-30 LAB — NOVEL CORONAVIRUS, NAA: SARS-CoV-2, NAA: NOT DETECTED

## 2019-04-16 ENCOUNTER — Other Ambulatory Visit: Payer: Self-pay | Admitting: Urology

## 2019-04-16 ENCOUNTER — Other Ambulatory Visit: Payer: Self-pay | Admitting: Nurse Practitioner

## 2019-04-16 DIAGNOSIS — E782 Mixed hyperlipidemia: Secondary | ICD-10-CM

## 2019-04-16 DIAGNOSIS — J410 Simple chronic bronchitis: Secondary | ICD-10-CM

## 2019-06-06 ENCOUNTER — Other Ambulatory Visit: Payer: Self-pay | Admitting: Nurse Practitioner

## 2019-06-06 DIAGNOSIS — K219 Gastro-esophageal reflux disease without esophagitis: Secondary | ICD-10-CM

## 2019-06-06 DIAGNOSIS — F32A Depression, unspecified: Secondary | ICD-10-CM

## 2019-06-06 DIAGNOSIS — F329 Major depressive disorder, single episode, unspecified: Secondary | ICD-10-CM

## 2019-06-18 ENCOUNTER — Telehealth: Payer: Self-pay | Admitting: Urology

## 2019-06-18 ENCOUNTER — Other Ambulatory Visit: Payer: Self-pay

## 2019-06-18 ENCOUNTER — Ambulatory Visit: Payer: Managed Care, Other (non HMO) | Admitting: Urology

## 2019-06-18 ENCOUNTER — Encounter: Payer: Self-pay | Admitting: Urology

## 2019-06-18 VITALS — BP 144/84 | HR 75 | Ht 65.0 in | Wt 168.0 lb

## 2019-06-18 DIAGNOSIS — L723 Sebaceous cyst: Secondary | ICD-10-CM | POA: Diagnosis not present

## 2019-06-18 MED ORDER — SULFAMETHOXAZOLE-TRIMETHOPRIM 800-160 MG PO TABS: 1.0000 | ORAL_TABLET | Freq: Two times a day (BID) | ORAL | 0 refills | Status: DC

## 2019-06-18 NOTE — Telephone Encounter (Signed)
Would you get his PA completed for his testosterone medication?
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
## 2019-06-19 NOTE — Telephone Encounter (Signed)
PA was done on cover My meds. They faxed a form to be filled out yesterday. I will fill out the form.
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
## 2019-06-20 ENCOUNTER — Ambulatory Visit (INDEPENDENT_AMBULATORY_CARE_PROVIDER_SITE_OTHER): Payer: Managed Care, Other (non HMO) | Admitting: Urology

## 2019-06-20 ENCOUNTER — Other Ambulatory Visit: Payer: Self-pay

## 2019-06-20 ENCOUNTER — Encounter: Payer: Self-pay | Admitting: Urology

## 2019-06-20 VITALS — BP 117/84 | HR 90 | Ht 65.0 in | Wt 168.0 lb

## 2019-06-20 DIAGNOSIS — L723 Sebaceous cyst: Secondary | ICD-10-CM | POA: Diagnosis not present

## 2019-06-20 MED ORDER — CLINDAMYCIN HCL 300 MG PO CAPS: 300.0000 mg | ORAL_CAPSULE | Freq: Three times a day (TID) | ORAL | 0 refills | Status: DC

## 2019-06-21 ENCOUNTER — Other Ambulatory Visit: Payer: Self-pay | Admitting: Nurse Practitioner

## 2019-06-21 DIAGNOSIS — J410 Simple chronic bronchitis: Secondary | ICD-10-CM

## 2019-07-13 ENCOUNTER — Other Ambulatory Visit: Payer: BLUE CROSS/BLUE SHIELD

## 2019-07-16 ENCOUNTER — Ambulatory Visit: Payer: Managed Care, Other (non HMO) | Admitting: Urology

## 2019-07-20 ENCOUNTER — Ambulatory Visit: Payer: BLUE CROSS/BLUE SHIELD | Admitting: Urology

## 2019-08-01 DIAGNOSIS — J410 Simple chronic bronchitis: Secondary | ICD-10-CM

## 2019-08-01 MED ORDER — SYMBICORT 160-4.5 MCG/ACT IN AERO: 2.0000 | INHALATION_SPRAY | Freq: Two times a day (BID) | RESPIRATORY_TRACT | 5 refills | Status: DC

## 2019-08-02 DIAGNOSIS — J410 Simple chronic bronchitis: Secondary | ICD-10-CM

## 2019-08-02 MED ORDER — BUDESONIDE-FORMOTEROL FUMARATE 160-4.5 MCG/ACT IN AERO: 2.0000 | INHALATION_SPRAY | Freq: Two times a day (BID) | RESPIRATORY_TRACT | 5 refills | Status: DC

## 2019-08-06 ENCOUNTER — Other Ambulatory Visit: Payer: Self-pay

## 2019-08-06 ENCOUNTER — Other Ambulatory Visit
Admission: RE | Admit: 2019-08-06 | Discharge: 2019-08-06 | Disposition: A | Discharge status: Home / Self Care | Payer: Managed Care, Other (non HMO) | Attending: Urology | Admitting: Urology

## 2019-08-06 ENCOUNTER — Other Ambulatory Visit: Payer: Self-pay | Admitting: Family Medicine

## 2019-08-06 DIAGNOSIS — E349 Endocrine disorder, unspecified: Secondary | ICD-10-CM | POA: Diagnosis not present

## 2019-08-06 DIAGNOSIS — R972 Elevated prostate specific antigen [PSA]: Secondary | ICD-10-CM | POA: Insufficient documentation

## 2019-08-06 LAB — HEMOGLOBIN AND HEMATOCRIT, BLOOD
HCT: 43.4 % (ref 39.0–52.0)
Hemoglobin: 15.1 g/dL (ref 13.0–17.0)

## 2019-08-06 LAB — PSA: Prostatic Specific Antigen: 3.25 ng/mL (ref 0.00–4.00)

## 2019-08-07 ENCOUNTER — Ambulatory Visit (INDEPENDENT_AMBULATORY_CARE_PROVIDER_SITE_OTHER): Payer: Managed Care, Other (non HMO) | Admitting: Urology

## 2019-08-07 ENCOUNTER — Encounter: Payer: Self-pay | Admitting: Urology

## 2019-08-07 ENCOUNTER — Other Ambulatory Visit: Payer: Self-pay | Admitting: Family Medicine

## 2019-08-07 VITALS — BP 127/84 | HR 69 | Ht 65.0 in | Wt 165.0 lb

## 2019-08-07 DIAGNOSIS — N529 Male erectile dysfunction, unspecified: Secondary | ICD-10-CM

## 2019-08-07 DIAGNOSIS — N138 Other obstructive and reflux uropathy: Secondary | ICD-10-CM

## 2019-08-07 DIAGNOSIS — Z87898 Personal history of other specified conditions: Secondary | ICD-10-CM

## 2019-08-07 DIAGNOSIS — N401 Enlarged prostate with lower urinary tract symptoms: Secondary | ICD-10-CM

## 2019-08-07 DIAGNOSIS — R972 Elevated prostate specific antigen [PSA]: Secondary | ICD-10-CM

## 2019-08-07 DIAGNOSIS — E349 Endocrine disorder, unspecified: Secondary | ICD-10-CM

## 2019-08-07 LAB — TESTOSTERONE: Testosterone: 434 ng/dL (ref 264–916)

## 2019-08-07 MED ORDER — TESTOSTERONE 20.25 MG/ACT (1.62%) TD GEL: TRANSDERMAL | 2 refills | Status: DC
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
## 2019-09-14 ENCOUNTER — Other Ambulatory Visit: Payer: Self-pay | Admitting: Urology

## 2019-09-14 MED ORDER — TESTOSTERONE 20.25 MG/ACT (1.62%) TD GEL: TRANSDERMAL | 2 refills | Status: DC

## 2019-10-25 ENCOUNTER — Other Ambulatory Visit: Payer: Self-pay | Admitting: Family Medicine

## 2019-10-25 DIAGNOSIS — E782 Mixed hyperlipidemia: Secondary | ICD-10-CM

## 2019-10-25 NOTE — Telephone Encounter (Signed)
Requested medication (s) are due for refill today: yes  Requested medication (s) are on the active medication list:yes  Last refill:  08/04/19  #90  0 refills  Future visit scheduled: No  Notes to clinic:  Labs have not been checked since 2018    Requested Prescriptions  Pending Prescriptions Disp Refills   atorvastatin (LIPITOR) 20 MG tablet [Pharmacy Med Name: ATORVASTATIN 20 MG TABLET] 90 tablet 0    Sig: TAKE 1 TABLET BY MOUTH EVERY DAY      Cardiovascular:  Antilipid - Statins Failed - 10/25/2019  4:56 PM      Failed - Total Cholesterol in normal range and within 360 days    Cholesterol, Total  Date Value Ref Range Status  07/29/2015 165 100 - 199 mg/dL Final   Cholesterol  Date Value Ref Range Status  03/09/2017 140 <200 mg/dL Final          Failed - LDL in normal range and within 360 days    LDL Cholesterol (Calc)  Date Value Ref Range Status  03/09/2017 81 mg/dL (calc) Final    Comment:    Reference range: <100 . Desirable range <100 mg/dL for primary prevention;   <70 mg/dL for patients with CHD or diabetic patients  with > or = 2 CHD risk factors. Marland Kitchen LDL-C is now calculated using the Martin-Hopkins  calculation, which is a validated novel method providing  better accuracy than the Friedewald equation in the  estimation of LDL-C.  Cresenciano Genre et al. Annamaria Helling. MU:[id number]): 2061-2068  (http://education.[employer].com/faq/FAQ164)           Failed - HDL in normal range and within 360 days    HDL  Date Value Ref Range Status  03/09/2017 35 (L) >40 mg/dL Final  07/29/2015 37 (L) >39 mg/dL Final          Failed - Triglycerides in normal range and within 360 days    Triglycerides  Date Value Ref Range Status  03/09/2017 138 <150 mg/dL Final          Passed - Patient is not pregnant      Passed - Valid encounter within last 12 months    Recent Outpatient Visits           7 months ago Suspected COVID-19 virus infection   [facility]  Merrilyn Puma, Jerrel Ivory, NP   1 year ago Acute rhinosinusitis   [place], DO   1 year ago Rotator cuff arthropathy of left shoulder   [facility] Merrilyn Puma, Jerrel Ivory, NP   2 years ago Cough   [facility] Merrilyn Puma, Jerrel Ivory, NP   2 years ago Acute bronchitis with COPD [facility])   [facility] Olin Hauser, DO       Future Appointments             In 3 months McGowan, Gordan Payment [facility]

## 2019-10-30 ENCOUNTER — Other Ambulatory Visit: Payer: Self-pay | Admitting: Nurse Practitioner

## 2019-10-30 DIAGNOSIS — E782 Mixed hyperlipidemia: Secondary | ICD-10-CM

## 2019-10-31 ENCOUNTER — Ambulatory Visit: Payer: Self-pay | Admitting: Family Medicine

## 2019-10-31 MED ORDER — ATORVASTATIN CALCIUM 20 MG PO TABS: ORAL_TABLET | ORAL | 0 refills | Status: DC

## 2019-11-09 ENCOUNTER — Telehealth (INDEPENDENT_AMBULATORY_CARE_PROVIDER_SITE_OTHER): Payer: 59 | Admitting: Family Medicine

## 2019-11-09 ENCOUNTER — Other Ambulatory Visit: Payer: Self-pay

## 2019-11-09 ENCOUNTER — Encounter: Payer: Self-pay | Admitting: Family Medicine

## 2019-11-09 DIAGNOSIS — E782 Mixed hyperlipidemia: Secondary | ICD-10-CM | POA: Diagnosis not present

## 2019-11-09 MED ORDER — ATORVASTATIN CALCIUM 20 MG PO TABS: ORAL_TABLET | ORAL | 0 refills | Status: DC

## 2019-11-09 NOTE — Progress Notes (Signed)
Virtual Visit via Telephone  The purpose of this virtual visit is to provide medical care while limiting exposure to the novel coronavirus (COVID19) for both patient and office staff.  Consent was obtained for phone visit:  Yes.   Answered questions that patient had about telehealth interaction:  Yes.   I discussed the limitations, risks, security and privacy concerns of performing an evaluation and management service by telephone. I also discussed with the patient that there may be a patient responsible charge related to this service. The patient expressed understanding and agreed to proceed.  Patient is at home and is accessed via telephone Services are provided by Harlin Rain, FNP-C from [facility])  ---------------------------------------------------------------------- Chief Complaint  Patient presents with  . Medication Refill    S: Reviewed CMA documentation. I have called patient and gathered additional HPI as follows:  Mr. Cottman presents for telemedicine visit for medication refill on his atorvastatin.  Requesting refill until he is able to be seen in clinic and have lab work completed.  Denies any acute concerns today.   Patient is currently working  Denies any high risk travel to areas of current concern for [place]. Denies any known or suspected exposure to person with or possibly with COVID19.  Denies any fevers, chills, sweats, body ache, cough, shortness of breath, sinus pain or pressure, headache, abdominal pain, diarrhea  Past Medical History:  Diagnosis Date  . Anxiety   . BPH with obstruction/lower urinary tract symptoms   . Cellulitis of foot   . Depression   . Elevated PSA   . Fatty infiltration of liver   . GERD (gastroesophageal reflux disease)   . Headache   . High grade prostatic intraepithelial neoplasia   . Hypogonadism in male   . NAFLD (nonalcoholic fatty liver disease)   . Skin abscess    Social History   Tobacco  Use  . Smoking status: Current Every Day Smoker    Packs/day: 1.00    Years: 40.00    Pack years: 40.00    Types: Cigarettes  . Smokeless tobacco: Never Used  Substance Use Topics  . Alcohol use: No    Alcohol/week: 0.0 standard drinks  . Drug use: No    Current Outpatient Medications:  .  ANORO ELLIPTA 62.5-25 MCG/INH AEPB, Inhale 1 puff into the lungs daily., Disp: , Rfl:  .  atorvastatin (LIPITOR) 20 MG tablet, Take 1 tablet daily, Disp: 30 tablet, Rfl: 0 .  escitalopram (LEXAPRO) 10 MG tablet, TAKE 1 TABLET BY MOUTH EVERY DAY, Disp: 90 tablet, Rfl: 1 .  omeprazole (PRILOSEC) 20 MG capsule, TAKE 1 CAPSULE BY MOUTH EVERY DAY, Disp: 90 capsule, Rfl: 1 .  PROAIR HFA 108 (90 Base) MCG/ACT inhaler, TAKE 2 PUFFS BY MOUTH EVERY 6 HOURS AS NEEDED FOR WHEEZE OR SHORTNESS OF BREATH, Disp: 8.5 g, Rfl: 2 .  Testosterone 20.25 MG/ACT (1.62%) GEL, APPLY 2 ACT TOPICALLY EVERY MORNING., Disp: 75 g, Rfl: 2 .  ipratropium (ATROVENT) 0.06 % nasal spray, PLACE 1-2 SPRAYS INTO BOTH NOSTRILS 4 (FOUR) TIMES DAILY. FOR UP TO 5-7 DAYS THEN STOP. (Patient not taking: Reported on 11/09/2019), Disp: 15 mL, Rfl: 2  Depression screen [facility] 2/9 11/09/2019 03/27/2019 10/04/2018  Decreased Interest 0 0 0  Down, Depressed, Hopeless 0 0 0  PHQ - 2 Score 0 0 0  Altered sleeping 0 - -  Tired, decreased energy 0 - -  Change in appetite 0 - -  Feeling bad or failure  about yourself  0 - -  Trouble concentrating 0 - -  Moving slowly or fidgety/restless 0 - -  Suicidal thoughts 0 - -  PHQ-9 Score 0 - -  Difficult doing work/chores Not difficult at all - -    GAD 7 : Generalized Anxiety Score 10/04/2018  Nervous, Anxious, on Edge 1  Control/stop worrying 0  Worry too much - different things 0  Trouble relaxing 1  Restless 0  Easily annoyed or irritable 2  Afraid - awful might happen 0  Total GAD 7 Score 4  Anxiety Difficulty Not difficult at all     -------------------------------------------------------------------------- O: No physical exam performed due to remote telephone encounter.  Physical Exam: Patient remotely monitored without video.  Verbal communication appropriate.  Cognition normal.  No results found for this or any previous visit (from the past 2160 hour(s)).  -------------------------------------------------------------------------- A&P:  Problem List Items Addressed This Visit      Other   Hyperlipidemia    Status unknown.  Recheck labs.  Continue meds without changes today.  Refills provided. Followup after labs.          No orders of the defined types were placed in this encounter.   Follow-up: - Return in 1-2 months for physical and lab work  Patient verbalizes understanding with the above medical recommendations including the limitation of remote medical advice.  Specific follow-up and call-back criteria were given for patient to follow-up or seek medical care more urgently if needed.   - Time spent in direct consultation with patient on phone: 5 minutes  Harlin Rain, [facility] 11/09/2019, 9:25 AM

## 2019-11-09 NOTE — Patient Instructions (Signed)
Your medication refills have been sent to your pharmacy on file.  As we discussed, it is important that we see you in office for a well visit and have your labs drawn.  Let us plan to see you in office in the next 1-2 months to discuss preventative health screenings and lab work that should be completed.  You will receive a survey after today's visit either digitally by e-mail or paper by [employer]. Your experiences and feedback matter to [place].  Please respond so we know how we are doing as we provide care for you.  Call us with any questions/concerns/needs.  It is my goal to be available to you for your health concerns.  Thanks for choosing me to be a partner in your healthcare needs!  Harlin Rain, FNP-C Family Nurse Practitioner [facility] Phone: [phone]

## 2019-11-09 NOTE — Assessment & Plan Note (Signed)
Status unknown.  Recheck labs.  Continue meds without changes today.  Refills provided. Followup after labs.  

## 2019-11-11 ENCOUNTER — Other Ambulatory Visit: Payer: Self-pay | Admitting: Family Medicine

## 2019-11-11 DIAGNOSIS — F419 Anxiety disorder, unspecified: Secondary | ICD-10-CM

## 2019-11-11 NOTE — Telephone Encounter (Signed)
Requested Prescriptions  Pending Prescriptions Disp Refills  . escitalopram (LEXAPRO) 10 MG tablet [Pharmacy Med Name: ESCITALOPRAM 10 MG TABLET] 90 tablet 1    Sig: TAKE 1 TABLET BY MOUTH EVERY DAY     Psychiatry:  Antidepressants - SSRI Passed - 11/11/2019  6:15 PM      Passed - Completed PHQ-2 or PHQ-9 in the last 360 days.      Passed - Valid encounter within last 6 months    Recent Outpatient Visits          2 days ago Mixed hyperlipidemia   [place], FNP   7 months ago Suspected COVID-19 virus infection   [facility] Merrilyn Puma, Jerrel Ivory, NP   1 year ago Acute rhinosinusitis   [place], DO   1 year ago Rotator cuff arthropathy of left shoulder   [facility] Merrilyn Puma, Jerrel Ivory, NP   2 years ago Cough   [facility] Merrilyn Puma, Jerrel Ivory, NP      Future Appointments            In 2 months McGowan, Gordan Payment [facility]

## 2019-11-21 ENCOUNTER — Encounter: Payer: Self-pay | Admitting: Family Medicine

## 2019-11-30 ENCOUNTER — Other Ambulatory Visit: Payer: Self-pay | Admitting: Family Medicine

## 2019-11-30 DIAGNOSIS — K219 Gastro-esophageal reflux disease without esophagitis: Secondary | ICD-10-CM

## 2019-12-14 ENCOUNTER — Encounter: Payer: Self-pay | Admitting: Family Medicine

## 2019-12-14 ENCOUNTER — Other Ambulatory Visit: Payer: Self-pay

## 2019-12-14 ENCOUNTER — Ambulatory Visit (INDEPENDENT_AMBULATORY_CARE_PROVIDER_SITE_OTHER): Payer: 59 | Admitting: Family Medicine

## 2019-12-14 VITALS — BP 120/72 | HR 73 | Temp 97.5°F | Ht 65.0 in | Wt 169.0 lb

## 2019-12-14 DIAGNOSIS — R17 Unspecified jaundice: Secondary | ICD-10-CM | POA: Diagnosis not present

## 2019-12-14 DIAGNOSIS — E782 Mixed hyperlipidemia: Secondary | ICD-10-CM

## 2019-12-14 DIAGNOSIS — R635 Abnormal weight gain: Secondary | ICD-10-CM

## 2019-12-14 DIAGNOSIS — H9193 Unspecified hearing loss, bilateral: Secondary | ICD-10-CM

## 2019-12-14 DIAGNOSIS — B356 Tinea cruris: Secondary | ICD-10-CM | POA: Insufficient documentation

## 2019-12-14 DIAGNOSIS — Z79899 Other long term (current) drug therapy: Secondary | ICD-10-CM

## 2019-12-14 DIAGNOSIS — R972 Elevated prostate specific antigen [PSA]: Secondary | ICD-10-CM

## 2019-12-14 DIAGNOSIS — Z23 Encounter for immunization: Secondary | ICD-10-CM

## 2019-12-14 DIAGNOSIS — Z0000 Encounter for general adult medical examination without abnormal findings: Secondary | ICD-10-CM | POA: Diagnosis not present

## 2019-12-14 DIAGNOSIS — M25542 Pain in joints of left hand: Secondary | ICD-10-CM

## 2019-12-14 DIAGNOSIS — B354 Tinea corporis: Secondary | ICD-10-CM

## 2019-12-14 DIAGNOSIS — H6123 Impacted cerumen, bilateral: Secondary | ICD-10-CM

## 2019-12-14 DIAGNOSIS — H9190 Unspecified hearing loss, unspecified ear: Secondary | ICD-10-CM | POA: Insufficient documentation

## 2019-12-14 DIAGNOSIS — R7309 Other abnormal glucose: Secondary | ICD-10-CM

## 2019-12-14 DIAGNOSIS — J410 Simple chronic bronchitis: Secondary | ICD-10-CM

## 2019-12-14 DIAGNOSIS — M2550 Pain in unspecified joint: Secondary | ICD-10-CM | POA: Insufficient documentation

## 2019-12-14 DIAGNOSIS — M25541 Pain in joints of right hand: Secondary | ICD-10-CM

## 2019-12-14 LAB — POCT URINALYSIS DIPSTICK
Bilirubin, UA: NEGATIVE
Blood, UA: NEGATIVE
Glucose, UA: POSITIVE — AB
Ketones, UA: NEGATIVE
Leukocytes, UA: NEGATIVE
Nitrite, UA: NEGATIVE
Protein, UA: NEGATIVE
Spec Grav, UA: 1.015 (ref 1.010–1.025)
Urobilinogen, UA: 0.2 E.U./dL
pH, UA: 5 (ref 5.0–8.0)

## 2019-12-14 MED ORDER — CLOTRIMAZOLE-BETAMETHASONE 1-0.05 % EX CREA: 1.0000 "application " | TOPICAL_CREAM | Freq: Two times a day (BID) | CUTANEOUS | 0 refills | Status: DC

## 2019-12-14 MED ORDER — ATORVASTATIN CALCIUM 20 MG PO TABS: ORAL_TABLET | ORAL | 3 refills | Status: DC

## 2019-12-14 MED ORDER — DEBROX 6.5 % OT SOLN: 5.0000 [drp] | Freq: Two times a day (BID) | OTIC | 0 refills | Status: DC

## 2019-12-14 NOTE — Assessment & Plan Note (Signed)
Decreased hearing, reports having to ask people to repeat what they have said multiple times.  In office hearing test completed and did not pass.  Impacted cerumen in office, ears lavaged and cleaned out with no change in hearing.  Plan: 1. Continue to use debrox ear drops as needed for wax build up 2. Referral to ENT/Otolaryngology provided for evaluation of decreased hearing

## 2019-12-14 NOTE — Assessment & Plan Note (Signed)
Recurrent, reported by patient, on left distal forearm.  Reports this flares up from time to time, can take benadryl and the itching will go away but the rash does not fully resolve.  Plan: 1. Begin clotrimazole-betamethasone cream as directed 2. Wash hands well before and after applying cream and avoid touching area 3. Follow up as needed
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
## 2019-12-14 NOTE — Assessment & Plan Note (Addendum)
Multiple joint pain in bilateral hands, shoulders and knees.  Reports joint swelling with node formation on bilateral hands effecting 2nd and 3rd digits.  Has some stiffness and reports when he is on his knees at work, it takes him quite a while to get back up to a standing position.  Has not had labs drawn for RA in the past.  Will order today for evaluation.  Plan: 1. Have labs drawn today and will contact with results 2. Based on results will discuss follow up visit

## 2019-12-14 NOTE — Assessment & Plan Note (Addendum)
Secondary to smoking.  Continues to smoke.  Reports stability in symptoms since being on anoro ellipta.  Reports has rescue inhaler in his truck and finds that he is only using it when he is in higher elevations, such as [place].  Plan: 1. Continue anoro ellipta as directed 2. Continue rescue inhaler as needed for SOB and wheezing.  If using this more than 2x per week to notify our office for a follow up visit sooner 3. Follow up in 6 months

## 2019-12-14 NOTE — Patient Instructions (Signed)
As we discussed, have your labs drawn today, and we will contact you with the results.  Concerning your joint pain, I have added on labs for rheumatoid factor and antibody testing to see if this is a rheumatologic origin for your pain and symptoms.  We should have those labs back in about 1 week.  A referral to ENT/Otolaryngology for your hearing loss concerns has been placed today.  If you have not heard from the specialty office or our referral coordinator within 1 week, please let us know and we will follow up with the referral coordinator for an update.  Your medication refills have been sent to your pharmacy on file.  I have sent in a prescription for your left forearm skin infection.  This is yeast and treated with a topical anti fungal medication.  Use this as directed.  I have sent in a prescription for ear drops to use in both ears, to help break down the wax.  Use these as directed.  Well Visit: Care Instructions Overview  Well visits can help you stay healthy. Your provider has checked your overall health and may have suggested ways to take good care of yourself. Your provider also may have recommended tests. At home, you can help prevent illness with healthy eating, regular exercise, and other steps.  Follow-up care is a key part of your treatment and safety. Be sure to make and go to all appointments, and call your provider if you are having problems. It's also a good idea to know your test results and keep a list of the medicines you take.  How can you care for yourself at home?   Get screening tests that you and your doctor decide on. Screening helps find diseases before any symptoms appear.   Eat healthy foods. Choose fruits, vegetables, whole grains, protein, and low-fat dairy foods. Limit fat, especially saturated fat. Reduce salt in your diet.   Limit alcohol. If you are a man, have no more than 2 drinks a day or 14 drinks a week. If you are a woman, have no more than 1  drink a day or 7 drinks a week.   Get at least 30 minutes of physical activity on most days of the week.  We recommend you go no more than 2 days in a row without exercise. Walking is a good choice. You also may want to do other activities, such as running, swimming, cycling, or playing tennis or team sports. Discuss any changes in your exercise program with your provider.   Reach and stay at a healthy weight. This will lower your risk for many problems, such as obesity, diabetes, heart disease, and high blood pressure.   Do not smoke or allow others to smoke around you. If you need help quitting, talk to your provider about stop-smoking programs and medicines. These can increase your chances of quitting for good.  Can call 1-800-QUIT-NOW 5074002295) for the [facility], assistance with smoking cessation.   Care for your mental health. It is easy to get weighed down by worry and stress. Learn strategies to manage stress, like deep breathing and mindfulness, and stay connected with your family and community. If you find you often feel sad or hopeless, talk with your provider. Treatment can help.   Talk to your provider about whether you have any risk factors for sexually transmitted infections (STIs). You can help prevent STIs if you wait to have sex with a new partner (or partners) until [place] each  been tested for STIs. It also helps if you use condoms (male or male condoms) and if you limit your sex partners to one person who only has sex with you. Vaccines are available for some STIs, such as HPV (these are age dependent).   Use birth control if it's important to you to prevent pregnancy. Talk with your provider about the choices available and what might be best for you.   If you think you may have a problem with alcohol or drug use, talk to your provider. This includes prescription medicines (such as amphetamines and opioids) and illegal drugs (such as cocaine and  methamphetamine). Your provider can help you figure out what type of treatment is best for you.   If you have concerns about domestic violence or intimate partner violence, there are resources available to you. National Domestic Abuse Hotline (929)581-5324   Protect your skin from too much sun. When you're outdoors from 10 a.m. to 4 p.m., stay in the shade or cover up with clothing and a hat with a wide brim. Wear sunglasses that block UV rays. Even when it's cloudy, put broad-spectrum sunscreen (SPF 30 or higher) on any exposed skin.   See a dentist one or two times a year for checkups and to have your teeth cleaned.   See an eye doctor once per year for an eye exam.   Wear a seat belt in the car.  When should you call for help?  Watch closely for changes in your health, and be sure to contact your provider if you have any problems or symptoms that concern you.  We will plan to see you back in 1 year for your next physical  You will receive a survey after today's visit either digitally by e-mail or paper by [place] mail. Your experiences and feedback matter to [place].  Please respond so we know how we are doing as we provide care for you.  Call us with any questions/concerns/needs.  It is my goal to be available to you for your health concerns.  Thanks for choosing me to be a partner in your healthcare needs!  Harlin Rain, FNP-C Family Nurse Practitioner [facility] Phone: [phone]

## 2019-12-17 ENCOUNTER — Ambulatory Visit (INDEPENDENT_AMBULATORY_CARE_PROVIDER_SITE_OTHER): Payer: 59 | Admitting: Family Medicine

## 2019-12-17 ENCOUNTER — Encounter: Payer: Self-pay | Admitting: Family Medicine

## 2019-12-17 ENCOUNTER — Other Ambulatory Visit: Payer: Self-pay

## 2019-12-17 VITALS — BP 116/62 | HR 74 | Temp 98.2°F | Ht 65.0 in | Wt 169.0 lb

## 2019-12-17 DIAGNOSIS — E1165 Type 2 diabetes mellitus with hyperglycemia: Secondary | ICD-10-CM | POA: Diagnosis not present

## 2019-12-17 DIAGNOSIS — E119 Type 2 diabetes mellitus without complications: Secondary | ICD-10-CM

## 2019-12-17 LAB — LIPID PANEL
Cholesterol: 129 mg/dL (ref <200)
HDL: 41 mg/dL (ref >=40)
LDL Cholesterol (Calc): 69 mg/dL (calc)
Non-HDL Cholesterol (Calc): 88 mg/dL (calc) (ref <130)
Total CHOL/HDL Ratio: 3.1 (calc) (ref <5.0)
Triglycerides: 109 mg/dL (ref <150)

## 2019-12-17 LAB — COMPLETE METABOLIC PANEL WITH GFR
AG Ratio: 1.7 (calc) (ref 1.0–2.5)
ALT: 50 U/L — ABNORMAL HIGH (ref 9–46)
AST: 23 U/L (ref 10–35)
Albumin: 4.2 g/dL (ref 3.6–5.1)
Alkaline phosphatase (APISO): 70 U/L (ref 35–144)
BUN: 13 mg/dL (ref 7–25)
CO2: 25 mmol/L (ref 20–32)
Calcium: 9.5 mg/dL (ref 8.6–10.3)
Chloride: 103 mmol/L (ref 98–110)
Creat: 0.74 mg/dL (ref 0.70–1.33)
GFR, Est African American: 119 mL/min/{1.73_m2} (ref >=60)
GFR, Est Non African American: 102 mL/min/{1.73_m2} (ref >=60)
Globulin: 2.5 g/dL (calc) (ref 1.9–3.7)
Glucose, Bld: 262 mg/dL — ABNORMAL HIGH (ref 65–99)
Potassium: 4.1 mmol/L (ref 3.5–5.3)
Sodium: 136 mmol/L (ref 135–146)
Total Bilirubin: 0.6 mg/dL (ref 0.2–1.2)
Total Protein: 6.7 g/dL (ref 6.1–8.1)

## 2019-12-17 LAB — CBC WITH DIFFERENTIAL/PLATELET
Absolute Monocytes: 330 cells/uL (ref 200–950)
Basophils Absolute: 50 cells/uL (ref 0–200)
Basophils Relative: 0.9 %
Eosinophils Absolute: 202 cells/uL (ref 15–500)
Eosinophils Relative: 3.6 %
HCT: 40.4 % (ref 38.5–50.0)
Hemoglobin: 13.7 g/dL (ref 13.2–17.1)
Lymphs Abs: 1949 cells/uL (ref 850–3900)
MCH: 31.6 pg (ref 27.0–33.0)
MCHC: 33.9 g/dL (ref 32.0–36.0)
MCV: 93.3 fL (ref 80.0–100.0)
MPV: 12.3 fL (ref 7.5–12.5)
Monocytes Relative: 5.9 %
Neutro Abs: 3069 cells/uL (ref 1500–7800)
Neutrophils Relative %: 54.8 %
Platelets: 135 10*3/uL — ABNORMAL LOW (ref 140–400)
RBC: 4.33 10*6/uL (ref 4.20–5.80)
RDW: 12.7 % (ref 11.0–15.0)
Total Lymphocyte: 34.8 %
WBC: 5.6 10*3/uL (ref 3.8–10.8)

## 2019-12-17 LAB — CYCLIC CITRUL PEPTIDE ANTIBODY, IGG: Cyclic Citrullin Peptide Ab: <16 UNITS

## 2019-12-17 LAB — HEMOGLOBIN A1C
Hgb A1c MFr Bld: 7.8 % of total Hgb — ABNORMAL HIGH (ref <5.7)
Mean Plasma Glucose: 177 (calc)
eAG (mmol/L): 9.8 (calc)

## 2019-12-17 LAB — THYROID PANEL WITH TSH
Free Thyroxine Index: 1.8 (ref 1.4–3.8)
T3 Uptake: 27 % (ref 22–35)
T4, Total: 6.6 ug/dL (ref 4.9–10.5)
TSH: 0.59 mIU/L (ref 0.40–4.50)

## 2019-12-17 LAB — PSA: PSA: 3.4 ng/mL (ref <=4.0)

## 2019-12-17 LAB — ANA: Anti Nuclear Antibody (ANA): NEGATIVE

## 2019-12-17 LAB — RHEUMATOID FACTOR: Rheumatoid fact SerPl-aCnc: <14 IU/mL (ref <14)

## 2019-12-17 MED ORDER — METFORMIN HCL 500 MG PO TABS: 500.0000 mg | ORAL_TABLET | Freq: Every day | ORAL | 3 refills | Status: DC

## 2019-12-17 MED ORDER — BLOOD GLUCOSE METER KIT: PACK | 0 refills | Status: DC

## 2019-12-18 ENCOUNTER — Other Ambulatory Visit: Payer: Self-pay

## 2019-12-18 ENCOUNTER — Telehealth: Payer: Self-pay

## 2019-12-18 DIAGNOSIS — E119 Type 2 diabetes mellitus without complications: Secondary | ICD-10-CM

## 2019-12-18 DIAGNOSIS — E1169 Type 2 diabetes mellitus with other specified complication: Secondary | ICD-10-CM | POA: Insufficient documentation

## 2019-12-18 MED ORDER — BLOOD GLUCOSE METER KIT: PACK | 0 refills | Status: DC

## 2019-12-18 NOTE — Progress Notes (Signed)
Subjective:    Patient ID: Brian Wolfe, male    DOB: 12/06/1960, 59 y.o.   MRN: [id number]  Brian Wolfe is a 59 y.o. male presenting on 12/17/2019 for lab results (go over labs)   HPI  [name] presents to clinic for follow up on his lab results showing new T2DM with an A1C of 7.8%.  Reports he has been having some symptoms of hyperglycemia by review of the hypo/hypoglycemia handout.  Denies any acute concerns today.  Depression screen [facility] 2/9 12/17/2019 11/09/2019 03/27/2019  Decreased Interest 0 0 0  Down, Depressed, Hopeless 0 0 0  PHQ - 2 Score 0 0 0  Altered sleeping 0 0 -  Tired, decreased energy 0 0 -  Change in appetite 0 0 -  Feeling bad or failure about yourself  0 0 -  Trouble concentrating 0 0 -  Moving slowly or fidgety/restless 0 0 -  Suicidal thoughts 0 0 -  PHQ-9 Score 0 0 -  Difficult doing work/chores Not difficult at all Not difficult at all -    Social History   Tobacco Use  . Smoking status: Current Every Day Smoker    Packs/day: 1.50    Years: 40.00    Pack years: 60.00    Types: Cigarettes  . Smokeless tobacco: Never Used  Vaping Use  . Vaping Use: Never used  Substance Use Topics  . Alcohol use: No    Alcohol/week: 0.0 standard drinks  . Drug use: No    Review of Systems  Constitutional: Negative.   HENT: Negative.   Eyes: Negative.   Respiratory: Negative.   Cardiovascular: Negative.   Gastrointestinal: Negative.   Endocrine: Negative.   Genitourinary: Negative.   Musculoskeletal: Negative.   Skin: Negative.   Allergic/Immunologic: Negative.   Neurological: Negative.   Hematological: Negative.   Psychiatric/Behavioral: Negative.    Per HPI unless specifically indicated above     Objective:    BP 116/62   Pulse 74   Temp 98.2 F (36.8 C) (Oral)   Ht _0  (1.651 m)   Wt 169 lb (76.7 kg)   SpO2 98%   BMI 28.12 kg/m   Wt Readings from Last 3 Encounters:  12/17/19 169 lb (76.7 kg)  12/14/19 169 lb (76.7 kg)    08/07/19 165 lb (74.8 kg)    Physical Exam Vitals reviewed.  Constitutional:      General: He is not in acute distress.    Appearance: Normal appearance. He is well-developed, well-groomed and overweight. He is not ill-appearing or toxic-appearing.  HENT:     Head: Normocephalic and atraumatic.     Nose:     Comments: Lizbeth Bark is in place, covering mouth and nose. Eyes:     General: Lids are normal. Vision grossly intact.        Right eye: No discharge.        Left eye: No discharge.     Extraocular Movements: Extraocular movements intact.     Conjunctiva/sclera: Conjunctivae normal.     Pupils: Pupils are equal, round, and reactive to light.  Cardiovascular:     Rate and Rhythm: Normal rate and regular rhythm.     Pulses: Normal pulses.     Heart sounds: Normal heart sounds. No murmur heard.  No friction rub. No gallop.   Pulmonary:     Effort: Pulmonary effort is normal. No respiratory distress.     Breath sounds: Normal breath sounds.  Musculoskeletal:  Right lower leg: No edema.     Left lower leg: No edema.  Skin:    General: Skin is warm and dry.     Capillary Refill: Capillary refill takes less than 2 seconds.  Neurological:     General: No focal deficit present.     Mental Status: He is alert and oriented to person, place, and time.  Psychiatric:        Attention and Perception: Attention and perception normal.        Mood and Affect: Mood and affect normal.        Speech: Speech normal.        Behavior: Behavior normal. Behavior is cooperative.        Thought Content: Thought content normal.        Cognition and Memory: Cognition and memory normal.        Judgment: Judgment normal.    Results for orders placed or performed in visit on 12/14/19  CBC with Differential  Result Value Ref Range   WBC 5.6 3.8 - 10.8 Thousand/uL   RBC 4.33 4.20 - 5.80 Million/uL   Hemoglobin 13.7 13.2 - 17.1 g/dL   HCT 40.4 38 - 50 %   MCV 93.3 80.0 - 100.0 fL   MCH 31.6  27.0 - 33.0 pg   MCHC 33.9 32.0 - 36.0 g/dL   RDW 12.7 11.0 - 15.0 %   Platelets 135 (L) 140 - 400 Thousand/uL   MPV 12.3 7.5 - 12.5 fL   Neutro Abs 3,069 1,500 - 7,800 cells/uL   Lymphs Abs 1,949 850 - 3,900 cells/uL   Absolute Monocytes 330 200 - 950 cells/uL   Eosinophils Absolute 202 15 - 500 cells/uL   Basophils Absolute 50 0 - 200 cells/uL   Neutrophils Relative % 54.8 %   Total Lymphocyte 34.8 %   Monocytes Relative 5.9 %   Eosinophils Relative 3.6 %   Basophils Relative 0.9 %  COMPLETE METABOLIC PANEL WITH GFR  Result Value Ref Range   Glucose, Bld 262 (H) 65 - 99 mg/dL   BUN 13 7 - 25 mg/dL   Creat 0.74 0.70 - 1.33 mg/dL   GFR, Est Non African American 102 > OR = 60 mL/min/1.50m   GFR, Est African American 119 > OR = 60 mL/min/1.761m  BUN/Creatinine Ratio NOT APPLICABLE 6 - 22 (calc)   Sodium 136 135 - 146 mmol/L   Potassium 4.1 3.5 - 5.3 mmol/L   Chloride 103 98 - 110 mmol/L   CO2 25 20 - 32 mmol/L   Calcium 9.5 8.6 - 10.3 mg/dL   Total Protein 6.7 6.1 - 8.1 g/dL   Albumin 4.2 3.6 - 5.1 g/dL   Globulin 2.5 1.9 - 3.7 g/dL (calc)   AG Ratio 1.7 1.0 - 2.5 (calc)   Total Bilirubin 0.6 0.2 - 1.2 mg/dL   Alkaline phosphatase (APISO) 70 35 - 144 U/L   AST 23 10 - 35 U/L   ALT 50 (H) 9 - 46 U/L  Lipid Profile  Result Value Ref Range   Cholesterol 129 <200 mg/dL   HDL 41 > OR = 40 mg/dL   Triglycerides 109 <150 mg/dL   LDL Cholesterol (Calc) 69 mg/dL (calc)   Total CHOL/HDL Ratio 3.1 <5.0 (calc)   Non-HDL Cholesterol (Calc) 88 <130 mg/dL (calc)  Thyroid Panel With TSH  Result Value Ref Range   T3 Uptake 27 22 - 35 %   T4, Total 6.6 4.9 - 10.5 mcg/dL  Free Thyroxine Index 1.8 1.4 - 3.8   TSH 0.59 0.40 - 4.50 mIU/L  PSA  Result Value Ref Range   PSA 3.4 < OR = 4.0 ng/mL  Hemoglobin A1c  Result Value Ref Range   Hgb A1c MFr Bld 7.8 (H) <5.7 % of total Hgb   Mean Plasma Glucose 177 (calc)   eAG (mmol/L) 9.8 (calc)  ANA  Result Value Ref Range   Anti Nuclear  Antibody (ANA) NEGATIVE NEGATIVE  Cyclic citrul peptide antibody, IgG  Result Value Ref Range   Cyclic Citrullin Peptide Ab <16 UNITS  Rheumatoid factor  Result Value Ref Range   Rhuematoid fact SerPl-aCnc <14 <14 IU/mL  POCT Urinalysis Dipstick  Result Value Ref Range   Color, UA Yellow    Clarity, UA clear    Glucose, UA Positive (A) Negative   Bilirubin, UA negative    Ketones, UA negative    Spec Grav, UA 1.015 1.010 - 1.025   Blood, UA negative    pH, UA 5.0 5.0 - 8.0   Protein, UA Negative Negative   Urobilinogen, UA 0.2 0.2 or 1.0 E.U./dL   Nitrite, UA negative    Leukocytes, UA Negative Negative   Appearance     Odor        Assessment & Plan:   Problem List Items Addressed This Visit      Endocrine   Diabetes ([place]) - Primary    New onset T2DM 11/2019.  Has not previously been diagnosed with diabetes in the past, but does have a significant other with diabetes, has strong support system for help with food choices and management of diabetes.  Discussed beginning metformin 539m with breakfast daily, we can repeat his A1C in 3 months, to begin making food changes, such as wheat bread for white bread, unsweet tea for sweet tea (can use splenda/stevia), and swapping out white starches (white rice, white bread, white potatoes, white rice, white flour) for more whole grain starches.  Provided with new diabetes patient information packets.  Plan: 1. Begin metformin 5047mdaily with breakfast 2. Begin checking blood sugar once per day and record on a log that you can bring back to clinic in 3 months 3. Repeat A1C in 3 months 4. Follow up in 3 months      Relevant Medications   metFORMIN (GLUCOPHAGE) 500 MG tablet   blood glucose meter kit and supplies      Meds ordered this encounter  Medications  . metFORMIN (GLUCOPHAGE) 500 MG tablet    Sig: Take 1 tablet (500 mg total) by mouth daily with breakfast.    Dispense:  90 tablet    Refill:  3  . blood glucose meter kit  and supplies    Sig: Dispense based on patient and insurance preference. Use up to four times daily as directed. (FOR ICD-10 E10.9, E11.9).    Dispense:  1 each    Refill:  0    Please allow patient to pick from whatever meters are covered    Order Specific Question:   Number of strips    Answer:   300    Order Specific Question:   Number of lancets    Answer:   300      Follow up plan: Return in about 3 months (around 03/18/2020) for DM F/U & A1C.   NiHarlin RainFNThe [facility] Nurse Practitioner [facility] 12/18/2019, 8:02 AM

## 2019-12-18 NOTE — Telephone Encounter (Signed)
Can he stop by the office and pick up the printed Rx?  For the bundle (meter, lancet and strips), is typically a hand written Rx because it doesn't have a device type - it so he has some choices with his insurance

## 2019-12-18 NOTE — Patient Instructions (Signed)
As we discussed, you have been diagnosed with diabetes.  I have sent in a prescription for metformin 500mg  to take 1 tablet daily with breakfast.  I have also sent in a prescription for a glucometer, test strips and lancets.  Begin taking your blood sugar once per day and record on the blood glucose log.  Bring that back to clinic with you at your next visit.  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  You also may find it easier to keep the small tubes of frosting that you can find at the grocery store, to help with a quick sugar boost if having symptoms of hypoglycemia.  The largest part of early management of diabetes is making manageable food changes, such as switching white starches for whole grain starches (white bread, rice, pasta, flour, potato) and going from sugary drinks (sweet tea, gatorade) to unsweetened (unsweet tea or gatorade zero) to reduce excessive sugar intake.  Your provider would like to you have your annual eye exam. Please contact your current eye doctor or here are some good options for you to contact.   [facility]   Address: [address] [place], [place] [address] Phone: [phone]  Website: [email or web address] [address] [place], [place] [address] Phone: [phone] [email or web address]  [facility]  Address: [place], [place], [place] [address] Phone: [phone]   [facility] [address] [place], [place] [address] Phone: [phone]  [facility] Address: [place], [place], [place] [address]  Phone: [phone]  Advice on [place]   1. Daily foot inspections - Look for any breaks in the skin or areas of irritation such as blisters or red areas. Report to primary doctor or foot nurse immediately if any problems occur.  - If you have vision problems and cannot see your feet well or it is hard for you to  reach your feet, ask a family member to inspect your feet daily.   2. Daily foot hygiene  - Wash feet daily, but do not soak your feet in hot water. If you do have callus, you may use warm water epsom salt foot soak and use moisturizer after. - Dry well especially between toes; Pat dry do not rub.  - If your skin is dry use a lotion to moisturize but never between the toes.  - If your skin is wet from perspiration, use an antifungal foot powder daily.   3. Shoes and Socks  - Wear a clean pair of socks daily.  - Make sure your shoes fit well and that you are measured and properly fit each time you purchase shoes. Your shoe size may change.  - Powder your shoes with a small amount of an antifungal foot powder daily, as the shoe is the only article of clothing that is not laundered.  - Wear appropriate shoes and socks for the weather. It is especially important to protect your feet from the cold; however, don't forget about sunscreen to the tops of your feet in the hot sun.  - Wear well fitting shoes rather than slippers or flip-flops when walking or standing for long period of time.  - When at home make sure you always have protective foot wear on your feet.   You can learn more information online about your diabetes at American Diabetes Association: http://www.diabetes.org/ - General self-care (  diet, medications, blood sugar checks). - Diet recommendations - There are even recipes available for you to look at and try.  We will plan to see you back in 3 months for diabetes follow up visit and A1C  You will receive a survey after today's visit either digitally by e-mail or paper by [employer]. Your experiences and feedback matter to [place].  Please respond so we know how we are doing as we provide care for you.  Call us with any questions/concerns/needs.  It is my goal to be available to you for your health concerns.  Thanks for choosing me to be a partner in your healthcare needs!  Harlin Rain, FNP-C Family Nurse Practitioner [facility] Phone: [phone]

## 2019-12-18 NOTE — Telephone Encounter (Signed)
Called pt wife answered told her I would leave it at the front. She will pick up tomorrow 12/19/2019.  KP

## 2019-12-18 NOTE — Assessment & Plan Note (Signed)
New onset T2DM 11/2019.  Has not previously been diagnosed with diabetes in the past, but does have a significant other with diabetes, has strong support system for help with food choices and management of diabetes.  Discussed beginning metformin 500mg  with breakfast daily, we can repeat his A1C in 3 months, to begin making food changes, such as wheat bread for white bread, unsweet tea for sweet tea (can use splenda/stevia), and swapping out white starches (white rice, white bread, white potatoes, white rice, white flour) for more whole grain starches.  Provided with new diabetes patient information packets.  Plan: 1. Begin metformin 500mg  daily with breakfast 2. Begin checking blood sugar once per day and record on a log that you can bring back to clinic in 3 months 3. Repeat A1C in 3 months 4. Follow up in 3 months

## 2019-12-18 NOTE — Telephone Encounter (Signed)
Copied from [place] [id number]. Topic: General - Other >> Dec 18, 2019  9:11 AM Leward Quan A wrote: Reason for CRM: Patient wife called to say that Rx for blood glucose meter kit and supplies was not received at Pharmacy per [facility] Rx was printed not sent electronically. Please send Rx patient is going on the road for work this morning and will be away for several weeks. Please advise

## 2020-01-14 ENCOUNTER — Other Ambulatory Visit: Payer: Self-pay

## 2020-01-14 ENCOUNTER — Ambulatory Visit (INDEPENDENT_AMBULATORY_CARE_PROVIDER_SITE_OTHER): Payer: 59 | Admitting: Family Medicine

## 2020-01-14 ENCOUNTER — Encounter: Payer: Self-pay | Admitting: Family Medicine

## 2020-01-14 VITALS — BP 126/80 | HR 71 | Temp 99.5°F

## 2020-01-14 DIAGNOSIS — J441 Chronic obstructive pulmonary disease with (acute) exacerbation: Secondary | ICD-10-CM | POA: Insufficient documentation

## 2020-01-14 MED ORDER — PREDNISONE 20 MG PO TABS: 40.0000 mg | ORAL_TABLET | Freq: Every day | ORAL | 0 refills | Status: AC

## 2020-01-14 MED ORDER — DOXYCYCLINE HYCLATE 100 MG PO TABS: 100.0000 mg | ORAL_TABLET | Freq: Two times a day (BID) | ORAL | 0 refills | Status: DC

## 2020-01-14 NOTE — Assessment & Plan Note (Signed)
COPD exacerbation x 1 week.  New diagnosis of T2DM on 12/17/2019.  Recently had loss of family member and reports drove home from [place] without stopping with increase in tobacco intake during that trip.  Discussed COPD exacerbation and treating with antibiotics and steroids, that steroids may increase his glucose readings for his AM CBG.  Patient declines inhaler, reports has rescue inhaler at home.  Offered and declined cough medicine for daytime and evening.  Plan: 1. Begin doxycycline 100mg  BID x 10 days 2. Begin prednisone 40mg  daily x 5 days 3. Use rescue inhaler, that has at home, as needed for any shortness of breath and/or wheezing 4. RTC if symptoms worsen or fail to improve.

## 2020-01-14 NOTE — Patient Instructions (Signed)
As we discussed, we are treating this as a COPD exacerbation based on your symptoms and history.  I have sent in a prescription for doxycycline 100mg  to take 1 tablet 2x per day for the next 10 days.  I have sent in a prescription for prednisone 40mg  to take daily x 5 days.  This medication will make your glucose higher on your glucose checks for your diabetes.  We will plan to see you back if symptoms worsen or fail to improve with current treatment  You will receive a survey after today's visit either digitally by e-mail or paper by [employer] mail. Your experiences and feedback matter to [place].  Please respond so we know how we are doing as we provide care for you.  Call us with any questions/concerns/needs.  It is my goal to be available to you for your health concerns.  Thanks for choosing me to be a partner in your healthcare needs!  Harlin Rain, FNP-C Family Nurse Practitioner [facility] Phone: [phone]

## 2020-01-14 NOTE — Progress Notes (Signed)
Virtual Visit via Telephone  The purpose of this virtual visit is to provide medical care while limiting exposure to the novel coronavirus (COVID19) for both patient and office staff.  Consent was obtained for phone visit:  Yes.   Answered questions that patient had about telehealth interaction:  Yes.   I discussed the limitations, risks, security and privacy concerns of performing an evaluation and management service by telephone. I also discussed with the patient that there may be a patient responsible charge related to this service. The patient expressed understanding and agreed to proceed.  Patient is at home and is accessed via telephone Services are provided by Harlin Rain, FNP-C from [facility])  ---------------------------------------------------------------------- Chief Complaint  Patient presents with  . Sinus Problem    sinus headache, nasal drainage, chest congestion, productive cough, sinus pressure behind the eyes, headache and mild SOB x 1 week .  Pt have not been vaccinated for COVID.     S: Reviewed CMA documentation. I have called patient and gathered additional HPI as follows:  Brian Wolfe presents to clinic via telemedicine for concerns of sinus headache, nasal drainage, chest congestion with productive cough, sinus pressure behind eyes, with mild headache and shortness of breath x 1 week.  Has not been vaccinated for COVID, denies sick contact exposure.  Denies fevers, sore throat, change in taste/smell, CP, abdominal pain, n/v/d.  Patient is currently home Denies any high risk travel to areas of current concern for COVID19. Denies any known or suspected exposure to person with or possibly with COVID19.  Past Medical History:  Diagnosis Date  . Anxiety   . BPH with obstruction/lower urinary tract symptoms   . Cellulitis of foot   . Depression   . Elevated PSA   . Fatty infiltration of liver   . GERD (gastroesophageal reflux disease)    . Headache   . High grade prostatic intraepithelial neoplasia   . Hypogonadism in male   . NAFLD (nonalcoholic fatty liver disease)   . Skin abscess    Social History   Tobacco Use  . Smoking status: Current Every Day Smoker    Packs/day: 1.50    Years: 40.00    Pack years: 60.00    Types: Cigarettes  . Smokeless tobacco: Never Used  Vaping Use  . Vaping Use: Never used  Substance Use Topics  . Alcohol use: No    Alcohol/week: 0.0 standard drinks  . Drug use: No    Current Outpatient Medications:  .  ANORO ELLIPTA 62.5-25 MCG/INH AEPB, Inhale 1 puff into the lungs daily., Disp: , Rfl:  .  atorvastatin (LIPITOR) 20 MG tablet, Take 1 tablet daily, Disp: 90 tablet, Rfl: 3 .  blood glucose meter kit and supplies, Dispense based on patient and insurance preference. Use up to four times daily as directed. (FOR ICD-10 E10.9, E11.9)., Disp: 1 each, Rfl: 0 .  carbamide peroxide (DEBROX) 6.5 % OTIC solution, Place 5 drops into both ears 2 (two) times daily., Disp: 15 mL, Rfl: 0 .  clotrimazole-betamethasone (LOTRISONE) cream, Apply 1 application topically 2 (two) times daily., Disp: 30 g, Rfl: 0 .  escitalopram (LEXAPRO) 10 MG tablet, TAKE 1 TABLET BY MOUTH EVERY DAY, Disp: 90 tablet, Rfl: 1 .  metFORMIN (GLUCOPHAGE) 500 MG tablet, Take 1 tablet (500 mg total) by mouth daily with breakfast., Disp: 90 tablet, Rfl: 3 .  omeprazole (PRILOSEC) 20 MG capsule, TAKE 1 CAPSULE BY MOUTH EVERY DAY, Disp: 30 capsule, Rfl: 0 .  PROAIR HFA 108 (90 Base) MCG/ACT inhaler, TAKE 2 PUFFS BY MOUTH EVERY 6 HOURS AS NEEDED FOR WHEEZE OR SHORTNESS OF BREATH, Disp: 8.5 g, Rfl: 2 .  Testosterone 20.25 MG/ACT (1.62%) GEL, APPLY 2 ACT TOPICALLY EVERY MORNING., Disp: 75 g, Rfl: 2 .  doxycycline (VIBRA-TABS) 100 MG tablet, Take 1 tablet (100 mg total) by mouth 2 (two) times daily., Disp: 20 tablet, Rfl: 0 .  predniSONE (DELTASONE) 20 MG tablet, Take 2 tablets (40 mg total) by mouth daily with breakfast for 5 days.,  Disp: 10 tablet, Rfl: 0  Depression screen [facility] 2/9 12/17/2019 11/09/2019 03/27/2019  Decreased Interest 0 0 0  Down, Depressed, Hopeless 0 0 0  PHQ - 2 Score 0 0 0  Altered sleeping 0 0 -  Tired, decreased energy 0 0 -  Change in appetite 0 0 -  Feeling bad or failure about yourself  0 0 -  Trouble concentrating 0 0 -  Moving slowly or fidgety/restless 0 0 -  Suicidal thoughts 0 0 -  PHQ-9 Score 0 0 -  Difficult doing work/chores Not difficult at all Not difficult at all -    GAD 7 : Generalized Anxiety Score 10/04/2018  Nervous, Anxious, on Edge 1  Control/stop worrying 0  Worry too much - different things 0  Trouble relaxing 1  Restless 0  Easily annoyed or irritable 2  Afraid - awful might happen 0  Total GAD 7 Score 4  Anxiety Difficulty Not difficult at all    -------------------------------------------------------------------------- O: No physical exam performed due to remote telephone encounter.  Physical Exam: Patient remotely monitored without video.  Verbal communication appropriate.  Cognition normal.  Recent Results (from the past 2160 hour(s))  CBC with Differential     Status: Abnormal   Collection Time: 12/14/19  9:06 AM  Result Value Ref Range   WBC 5.6 3.8 - 10.8 Thousand/uL   RBC 4.33 4.20 - 5.80 Million/uL   Hemoglobin 13.7 13.2 - 17.1 g/dL   HCT 40.4 38 - 50 %   MCV 93.3 80.0 - 100.0 fL   MCH 31.6 27.0 - 33.0 pg   MCHC 33.9 32.0 - 36.0 g/dL   RDW 12.7 11.0 - 15.0 %   Platelets 135 (L) 140 - 400 Thousand/uL   MPV 12.3 7.5 - 12.5 fL   Neutro Abs 3,069 1,500 - 7,800 cells/uL   Lymphs Abs 1,949 850 - 3,900 cells/uL   Absolute Monocytes 330 200 - 950 cells/uL   Eosinophils Absolute 202 15 - 500 cells/uL   Basophils Absolute 50 0 - 200 cells/uL   Neutrophils Relative % 54.8 %   Total Lymphocyte 34.8 %   Monocytes Relative 5.9 %   Eosinophils Relative 3.6 %   Basophils Relative 0.9 %  COMPLETE METABOLIC PANEL WITH GFR     Status: Abnormal    Collection Time: 12/14/19  9:06 AM  Result Value Ref Range   Glucose, Bld 262 (H) 65 - 99 mg/dL    Comment: .            Fasting reference interval . For someone without known diabetes, a glucose value >125 mg/dL indicates that they may have diabetes and this should be confirmed with a follow-up test. .    BUN 13 7 - 25 mg/dL   Creat 0.74 0.70 - 1.33 mg/dL    Comment: For patients >31 years of age, the reference limit for Creatinine is approximately 13% higher for people identified as African-American. Marland Kitchen  GFR, Est Non African American 102 > OR = 60 mL/min/1.60m   GFR, Est African American 119 > OR = 60 mL/min/1.733m  BUN/Creatinine Ratio NOT APPLICABLE 6 - 22 (calc)   Sodium 136 135 - 146 mmol/L   Potassium 4.1 3.5 - 5.3 mmol/L   Chloride 103 98 - 110 mmol/L   CO2 25 20 - 32 mmol/L   Calcium 9.5 8.6 - 10.3 mg/dL   Total Protein 6.7 6.1 - 8.1 g/dL   Albumin 4.2 3.6 - 5.1 g/dL   Globulin 2.5 1.9 - 3.7 g/dL (calc)   AG Ratio 1.7 1.0 - 2.5 (calc)   Total Bilirubin 0.6 0.2 - 1.2 mg/dL   Alkaline phosphatase (APISO) 70 35 - 144 U/L   AST 23 10 - 35 U/L   ALT 50 (H) 9 - 46 U/L  Lipid Profile     Status: None   Collection Time: 12/14/19  9:06 AM  Result Value Ref Range   Cholesterol 129 <200 mg/dL   HDL 41 > OR = 40 mg/dL   Triglycerides 109 <150 mg/dL   LDL Cholesterol (Calc) 69 mg/dL (calc)    Comment: Reference range: <100 . Desirable range <100 mg/dL for primary prevention;   <70 mg/dL for patients with CHD or diabetic patients  with > or = 2 CHD risk factors. . Marland KitchenDL-C is now calculated using the Martin-Hopkins  calculation, which is a validated novel method providing  better accuracy than the Friedewald equation in the  estimation of LDL-C.  MaCresenciano Genret al. JAAnnamaria [id number];416(60 [id number]  (http://education.[facility].com/faq/FAQ164)    Total CHOL/HDL Ratio 3.1 <5.0 (calc)   Non-HDL Cholesterol (Calc) 88 <130 mg/dL (calc)    Comment: For patients with  diabetes plus 1 major ASCVD risk  factor, treating to a non-HDL-C goal of <100 mg/dL  (LDL-C of <70 mg/dL) is considered a therapeutic  option.   Thyroid Panel With TSH     Status: None   Collection Time: 12/14/19  9:06 AM  Result Value Ref Range   T3 Uptake 27 22 - 35 %   T4, Total 6.6 4.9 - 10.5 mcg/dL   Free Thyroxine Index 1.8 1.4 - 3.8   TSH 0.59 0.40 - 4.50 mIU/L  PSA     Status: None   Collection Time: 12/14/19  9:06 AM  Result Value Ref Range   PSA 3.4 < OR = 4.0 ng/mL    Comment: The total PSA value from this assay system is  standardized against the WHO standard. The test  result will be approximately 20% lower when compared  to the equimolar-standardized total PSA (Beckman  Coulter). Comparison of serial PSA results should be  interpreted with this fact in mind. . This test was performed using the Siemens  chemiluminescent method. Values obtained from  different assay methods cannot be used interchangeably. PSA levels, regardless of value, should not be interpreted as absolute evidence of the presence or absence of disease.   Hemoglobin A1c     Status: Abnormal   Collection Time: 12/14/19  9:06 AM  Result Value Ref Range   Hgb A1c MFr Bld 7.8 (H) <5.7 % of total Hgb    Comment: For someone without known diabetes, a hemoglobin A1c value of 6.5% or greater indicates that they may have  diabetes and this should be confirmed with a follow-up  test. . For someone with known diabetes, a value <7% indicates  that their diabetes is well controlled and a value  greater than  or equal to 7% indicates suboptimal  control. A1c targets should be individualized based on  duration of diabetes, age, comorbid conditions, and  other considerations. . Currently, no consensus exists regarding use of hemoglobin A1c for diagnosis of diabetes for children. .    Mean Plasma Glucose 177 (calc)   eAG (mmol/L) 9.8 (calc)  ANA     Status: None   Collection Time: 12/14/19  9:06 AM   Result Value Ref Range   Anti Nuclear Antibody (ANA) NEGATIVE NEGATIVE    Comment: ANA IFA is a first line screen for detecting the presence of up to approximately 150 autoantibodies in various autoimmune diseases. A negative ANA IFA result suggests an ANA-associated autoimmune disease is not present at this time, but is not definitive. If there is high clinical suspicion for Sjogren's syndrome, testing for anti-SS-A/Ro antibody should be considered. Anti-Jo-1 antibody should be considered for clinically suspected inflammatory myopathies. . AC-0: Negative . International Consensus on ANA Patterns (https://www.hernandez-brewer.com/) . For additional information, please refer to http://education.[facility].com/faq/FAQ177 (This link is being provided for informational/ educational purposes only.) .   Cyclic citrul peptide antibody, IgG     Status: None   Collection Time: 12/14/19  9:06 AM  Result Value Ref Range   Cyclic Citrullin Peptide Ab <16 UNITS    Comment: Reference Range Negative:            <20 Weak Positive:       20-39 Moderate Positive:   40-59 Strong Positive:     >59 .   Rheumatoid factor     Status: None   Collection Time: 12/14/19  9:06 AM  Result Value Ref Range   Rhuematoid fact SerPl-aCnc <14 <14 IU/mL  POCT Urinalysis Dipstick     Status: Abnormal   Collection Time: 12/14/19  9:44 AM  Result Value Ref Range   Color, UA Yellow    Clarity, UA clear    Glucose, UA Positive (A) Negative    Comment: 1000   Bilirubin, UA negative    Ketones, UA negative    Spec Grav, UA 1.015 1.010 - 1.025   Blood, UA negative    pH, UA 5.0 5.0 - 8.0   Protein, UA Negative Negative   Urobilinogen, UA 0.2 0.2 or 1.0 E.U./dL   Nitrite, UA negative    Leukocytes, UA Negative Negative   Appearance     Odor      -------------------------------------------------------------------------- A&P:  Problem List Items Addressed This Visit      Respiratory    COPD exacerbation (HCC) - Primary    COPD exacerbation x 1 week.  New diagnosis of T2DM on 12/17/2019.  Recently had loss of family member and reports drove home from [place] without stopping with increase in tobacco intake during that trip.  Discussed COPD exacerbation and treating with antibiotics and steroids, that steroids may increase his glucose readings for his AM CBG.  Patient declines inhaler, reports has rescue inhaler at home.  Offered and declined cough medicine for daytime and evening.  Plan: 1. Begin doxycycline 167m BID x 10 days 2. Begin prednisone 428mdaily x 5 days 3. Use rescue inhaler, that has at home, as needed for any shortness of breath and/or wheezing 4. RTC if symptoms worsen or fail to improve.      Relevant Medications   doxycycline (VIBRA-TABS) 100 MG tablet   predniSONE (DELTASONE) 20 MG tablet      Meds ordered this encounter  Medications  . doxycycline (VIBRA-TABS) 100 MG tablet  Sig: Take 1 tablet (100 mg total) by mouth 2 (two) times daily.    Dispense:  20 tablet    Refill:  0  . predniSONE (DELTASONE) 20 MG tablet    Sig: Take 2 tablets (40 mg total) by mouth daily with breakfast for 5 days.    Dispense:  10 tablet    Refill:  0    Follow-up: - Return if symptoms worsen or fail to improve  Patient verbalizes understanding with the above medical recommendations including the limitation of remote medical advice.  Specific follow-up and call-back criteria were given for patient to follow-up or seek medical care more urgently if needed.  - Time spent in direct consultation with patient on phone: 6 minutes  Harlin Rain, [facility] 01/14/2020, 8:55 AM

## 2020-01-21 ENCOUNTER — Telehealth: Payer: Self-pay | Admitting: Urology

## 2020-01-21 NOTE — Telephone Encounter (Signed)
Please change the lab order to the [facility] location.  Patient will have labs on Friday, 8/20.

## 2020-01-22 ENCOUNTER — Other Ambulatory Visit: Payer: Self-pay | Admitting: Family Medicine

## 2020-01-22 DIAGNOSIS — E349 Endocrine disorder, unspecified: Secondary | ICD-10-CM

## 2020-01-22 DIAGNOSIS — R972 Elevated prostate specific antigen [PSA]: Secondary | ICD-10-CM

## 2020-01-22 NOTE — Telephone Encounter (Signed)
Lab orders have been changed.

## 2020-01-29 ENCOUNTER — Encounter: Payer: Self-pay | Admitting: Family Medicine

## 2020-01-31 ENCOUNTER — Other Ambulatory Visit: Payer: Self-pay

## 2020-02-05 ENCOUNTER — Ambulatory Visit: Payer: Self-pay | Admitting: Urology

## 2020-02-06 ENCOUNTER — Other Ambulatory Visit: Payer: Self-pay | Admitting: *Deleted

## 2020-02-06 ENCOUNTER — Other Ambulatory Visit: Payer: Self-pay

## 2020-02-06 ENCOUNTER — Other Ambulatory Visit: Payer: 59

## 2020-02-06 DIAGNOSIS — E349 Endocrine disorder, unspecified: Secondary | ICD-10-CM

## 2020-02-06 DIAGNOSIS — R972 Elevated prostate specific antigen [PSA]: Secondary | ICD-10-CM

## 2020-02-07 LAB — HEMOGLOBIN AND HEMATOCRIT, BLOOD
Hematocrit: 44.4 % (ref 37.5–51.0)
Hemoglobin: 14.8 g/dL (ref 13.0–17.7)

## 2020-02-07 LAB — PSA: Prostate Specific Ag, Serum: 4.6 ng/mL — ABNORMAL HIGH (ref 0.0–4.0)

## 2020-02-07 LAB — TESTOSTERONE: Testosterone: 357 ng/dL (ref 264–916)
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
## 2020-02-11 ENCOUNTER — Encounter: Payer: Self-pay | Admitting: Urology

## 2020-02-11 ENCOUNTER — Ambulatory Visit: Payer: 59 | Admitting: Urology

## 2020-02-11 ENCOUNTER — Other Ambulatory Visit: Payer: Self-pay

## 2020-02-11 VITALS — BP 130/74 | HR 71 | Ht 65.0 in | Wt 163.0 lb

## 2020-02-11 DIAGNOSIS — N401 Enlarged prostate with lower urinary tract symptoms: Secondary | ICD-10-CM | POA: Diagnosis not present

## 2020-02-11 DIAGNOSIS — R972 Elevated prostate specific antigen [PSA]: Secondary | ICD-10-CM | POA: Diagnosis not present

## 2020-02-11 DIAGNOSIS — E349 Endocrine disorder, unspecified: Secondary | ICD-10-CM | POA: Diagnosis not present

## 2020-02-11 DIAGNOSIS — N138 Other obstructive and reflux uropathy: Secondary | ICD-10-CM

## 2020-02-11 DIAGNOSIS — N529 Male erectile dysfunction, unspecified: Secondary | ICD-10-CM | POA: Diagnosis not present

## 2020-03-01 ENCOUNTER — Encounter: Payer: Self-pay | Admitting: Family Medicine

## 2020-03-04 ENCOUNTER — Telehealth: Payer: Self-pay | Admitting: Family Medicine

## 2020-03-04 NOTE — Telephone Encounter (Signed)
Patient's wife called to try and schedule labs and a follow up with patient.  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  Please advise and call to discuss with wife Apolonio Schneiders at [phone]

## 2020-03-12 ENCOUNTER — Other Ambulatory Visit: Payer: Self-pay | Admitting: Family Medicine

## 2020-03-12 ENCOUNTER — Other Ambulatory Visit: Payer: Self-pay

## 2020-03-12 ENCOUNTER — Other Ambulatory Visit: Payer: 59

## 2020-03-12 DIAGNOSIS — E782 Mixed hyperlipidemia: Secondary | ICD-10-CM

## 2020-03-12 DIAGNOSIS — F32A Depression, unspecified: Secondary | ICD-10-CM

## 2020-03-12 DIAGNOSIS — K219 Gastro-esophageal reflux disease without esophagitis: Secondary | ICD-10-CM

## 2020-03-12 DIAGNOSIS — F419 Anxiety disorder, unspecified: Secondary | ICD-10-CM

## 2020-03-12 DIAGNOSIS — E119 Type 2 diabetes mellitus without complications: Secondary | ICD-10-CM

## 2020-03-13 ENCOUNTER — Other Ambulatory Visit: Payer: 59

## 2020-03-13 ENCOUNTER — Other Ambulatory Visit: Payer: Self-pay | Admitting: Urology

## 2020-03-13 LAB — COMPLETE METABOLIC PANEL WITH GFR
AG Ratio: 1.8 (calc) (ref 1.0–2.5)
ALT: 40 U/L (ref 9–46)
AST: 27 U/L (ref 10–35)
Albumin: 4.4 g/dL (ref 3.6–5.1)
Alkaline phosphatase (APISO): 67 U/L (ref 35–144)
BUN: 10 mg/dL (ref 7–25)
CO2: 25 mmol/L (ref 20–32)
Calcium: 9.6 mg/dL (ref 8.6–10.3)
Chloride: 104 mmol/L (ref 98–110)
Creat: 0.79 mg/dL (ref 0.70–1.33)
GFR, Est African American: 115 mL/min/{1.73_m2} (ref >=60)
GFR, Est Non African American: 99 mL/min/{1.73_m2} (ref >=60)
Globulin: 2.5 g/dL (calc) (ref 1.9–3.7)
Glucose, Bld: 114 mg/dL — ABNORMAL HIGH (ref 65–99)
Potassium: 4 mmol/L (ref 3.5–5.3)
Sodium: 138 mmol/L (ref 135–146)
Total Bilirubin: 0.6 mg/dL (ref 0.2–1.2)
Total Protein: 6.9 g/dL (ref 6.1–8.1)

## 2020-03-13 LAB — CBC WITH DIFFERENTIAL/PLATELET
Absolute Monocytes: 395 cells/uL (ref 200–950)
Basophils Absolute: 54 cells/uL (ref 0–200)
Basophils Relative: 0.8 %
Eosinophils Absolute: 302 cells/uL (ref 15–500)
Eosinophils Relative: 4.5 %
HCT: 42.3 % (ref 38.5–50.0)
Hemoglobin: 14.7 g/dL (ref 13.2–17.1)
Lymphs Abs: 2425 cells/uL (ref 850–3900)
MCH: 33 pg (ref 27.0–33.0)
MCHC: 34.8 g/dL (ref 32.0–36.0)
MCV: 94.8 fL (ref 80.0–100.0)
MPV: 11.8 fL (ref 7.5–12.5)
Monocytes Relative: 5.9 %
Neutro Abs: 3524 cells/uL (ref 1500–7800)
Neutrophils Relative %: 52.6 %
Platelets: 181 10*3/uL (ref 140–400)
RBC: 4.46 10*6/uL (ref 4.20–5.80)
RDW: 12.5 % (ref 11.0–15.0)
Total Lymphocyte: 36.2 %
WBC: 6.7 10*3/uL (ref 3.8–10.8)

## 2020-03-13 LAB — LIPID PANEL
Cholesterol: 140 mg/dL (ref <200)
HDL: 38 mg/dL — ABNORMAL LOW (ref >=40)
LDL Cholesterol (Calc): 79 mg/dL (calc)
Non-HDL Cholesterol (Calc): 102 mg/dL (calc) (ref <130)
Total CHOL/HDL Ratio: 3.7 (calc) (ref <5.0)
Triglycerides: 130 mg/dL (ref <150)

## 2020-03-13 LAB — TSH+FREE T4: TSH W/REFLEX TO FT4: 0.72 mIU/L (ref 0.40–4.50)

## 2020-03-13 LAB — HEMOGLOBIN A1C
Hgb A1c MFr Bld: 7.6 % of total Hgb — ABNORMAL HIGH (ref <5.7)
Mean Plasma Glucose: 171 (calc)
eAG (mmol/L): 9.5 (calc)

## 2020-03-14 ENCOUNTER — Ambulatory Visit: Payer: 59 | Admitting: Family Medicine

## 2020-03-14 ENCOUNTER — Encounter: Payer: Self-pay | Admitting: Family Medicine

## 2020-03-14 ENCOUNTER — Other Ambulatory Visit: Payer: Self-pay

## 2020-03-14 VITALS — BP 117/71 | HR 60 | Temp 98.0°F | Resp 18 | Ht 65.0 in | Wt 165.2 lb

## 2020-03-14 DIAGNOSIS — E119 Type 2 diabetes mellitus without complications: Secondary | ICD-10-CM

## 2020-03-14 DIAGNOSIS — B351 Tinea unguium: Secondary | ICD-10-CM | POA: Insufficient documentation

## 2020-03-14 MED ORDER — METFORMIN HCL ER 750 MG PO TB24: 750.0000 mg | ORAL_TABLET | Freq: Every day | ORAL | 1 refills | Status: DC

## 2020-03-14 MED ORDER — TERBINAFINE HCL 250 MG PO TABS: 250.0000 mg | ORAL_TABLET | Freq: Every day | ORAL | 0 refills | Status: DC

## 2020-03-14 NOTE — Assessment & Plan Note (Signed)
Toenail fungus to all 10 toenails.  Greater toes with darkening of toes towards right side of nail, with tenderness to palpation.  Thickening of nails with yellowing.  Has tried topical preparations.  Is interested in oral medications to help clear up toe nail fungus.  Discussed medication can be difficult on the liver, that we just had labs done, we can start on terbinafine for 30 days and would need to have labs done every 4 weeks to check liver function over the next 12 weeks while on this medication.  Patient in agreement.  Plan: 1. Begin terbinafine 250mg  tablet 1x per day for the next 30 days. 2. Repeat CMP in 4 weeks 3. RTC in 3 months for re-evaluation

## 2020-03-14 NOTE — Assessment & Plan Note (Signed)
ControlledDM with A1c 7.6% improved from 7.8% on 12/14/2019 and goal A1c < 7.0%. - No known complications or hypoglycemia.  Plan:  1. Change therapy: 2. Encourage improved lifestyle: - low carb/low glycemic diet reinforced prior education - Increase physical activity to 30 minutes most days of the week.  Explained that increased physical activity increases body's use of sugar for energy. 3. Check fasting am CBG and log these.  Bring log to next visit for review 4. Continue Statin 5. DM Foot exam done today with out acute findings.   and Advised to schedule DM ophtho exam, send record. 6. Follow-up 3 months

## 2020-03-14 NOTE — Progress Notes (Signed)
Subjective:    Patient ID: Brian Wolfe, male    DOB: 1961/01/18, 59 y.o.   MRN: [id number]  Brian Wolfe is a 59 y.o. male presenting on 03/14/2020 for Diabetes and Nail Problem (left great toe)   HPI  Diabetes Pt presents today for follow up Type 2 Diabetes Mellitus.  He/she (caps): He ACTION; IS/IS NOT: is checking AM CBG at home with range of 101-247. -Current diabetic medications include: metformin 500mg  daily -ACTION; IS/IS NOT: is not currently symptomatic -Actions; denies/reports/admits to: denies polydipsia, polyphagia, polyuria, headaches, diaphoresis, shakiness, chills, pain, numbness or tingling in extremities or changes in vision -Clinical course has been improving  -Reports no structured exercise routine -Diet is moderate in salt, moderate in fat, and moderate in carbohydrates  PREVENTION Eye exam current (within 1 year) Encouraged to schedule Foot exam current (within 1 year) Up to date Lipid/ASCVD risk reduction - on statin: YES/NO: Yes  Kidney Protection (On ACE/ARB)? YES/NO: No    Depression screen [facility] 2/9 12/17/2019 11/09/2019 03/27/2019  Decreased Interest 0 0 0  Down, Depressed, Hopeless 0 0 0  PHQ - 2 Score 0 0 0  Altered sleeping 0 0 -  Tired, decreased energy 0 0 -  Change in appetite 0 0 -  Feeling bad or failure about yourself  0 0 -  Trouble concentrating 0 0 -  Moving slowly or fidgety/restless 0 0 -  Suicidal thoughts 0 0 -  PHQ-9 Score 0 0 -  Difficult doing work/chores Not difficult at all Not difficult at all -    Social History   Tobacco Use  . Smoking status: Current Every Day Smoker    Packs/day: 1.00    Years: 40.00    Pack years: 40.00    Types: Cigarettes  . Smokeless tobacco: Never Used  Vaping Use  . Vaping Use: Never used  Substance Use Topics  . Alcohol use: No    Alcohol/week: 0.0 standard drinks  . Drug use: No    Review of Systems  Constitutional: Negative.   HENT: Negative.   Eyes: Negative.   Respiratory:  Negative.   Cardiovascular: Negative.   Gastrointestinal: Negative.   Endocrine: Negative.   Genitourinary: Negative.   Musculoskeletal: Negative.   Skin: Negative.        Toenail fungus  Allergic/Immunologic: Negative.   Neurological: Negative.   Hematological: Negative.   Psychiatric/Behavioral: Negative.    Per HPI unless specifically indicated above     Objective:    BP 117/71 (BP Location: Left Arm, Patient Position: Sitting, Cuff Size: Normal)   Pulse 60   Temp 98 F (36.7 C) (Oral)   Resp 18   Ht 5\' 5"  (1.651 m)   Wt 165 lb 3.2 oz (74.9 kg)   SpO2 99%   BMI 27.49 kg/m   Wt Readings from Last 3 Encounters:  03/14/20 165 lb 3.2 oz (74.9 kg)  02/11/20 163 lb (73.9 kg)  12/17/19 169 lb (76.7 kg)    Physical Exam Vitals reviewed.  Constitutional:      General: He is not in acute distress.    Appearance: Normal appearance. He is well-developed, well-groomed and overweight. He is not ill-appearing or toxic-appearing.  HENT:     Head: Normocephalic and atraumatic.     Nose:     Comments: Lizbeth Bark is in place, covering mouth and nose. Eyes:     General:        Right eye: No discharge.  Left eye: No discharge.     Extraocular Movements: Extraocular movements intact.     Conjunctiva/sclera: Conjunctivae normal.     Pupils: Pupils are equal, round, and reactive to light.  Cardiovascular:     Rate and Rhythm: Normal rate and regular rhythm.     Pulses: Normal pulses.          Dorsalis pedis pulses are 2+ on the right side and 2+ on the left side.     Heart sounds: Normal heart sounds. No murmur heard.  No friction rub. No gallop.   Pulmonary:     Effort: Pulmonary effort is normal. No respiratory distress.     Breath sounds: Normal breath sounds.  Musculoskeletal:     Right lower leg: No edema.     Left lower leg: No edema.  Feet:     Right foot:     Skin integrity: Skin integrity normal.     Toenail Condition: Right toenails are abnormally thick.  Fungal disease present.    Left foot:     Skin integrity: Skin integrity normal.     Toenail Condition: Left toenails are abnormally thick. Fungal disease present.    Comments: Absent of callus and all other lesions.  Some dry skin noted, but pt reports regular foot care and can see bottom of his feet.  Skin:    General: Skin is warm and dry.     Capillary Refill: Capillary refill takes less than 2 seconds.  Neurological:     General: No focal deficit present.     Mental Status: He is alert and oriented to person, place, and time.  Psychiatric:        Attention and Perception: Attention and perception normal.        Mood and Affect: Mood and affect normal.        Speech: Speech normal.        Behavior: Behavior normal. Behavior is cooperative.        Thought Content: Thought content normal.        Cognition and Memory: Cognition and memory normal.    Results for orders placed or performed in visit on 03/12/20  CBC with Differential  Result Value Ref Range   WBC 6.7 3.8 - 10.8 Thousand/uL   RBC 4.46 4.20 - 5.80 Million/uL   Hemoglobin 14.7 13.2 - 17.1 g/dL   HCT 42.3 38 - 50 %   MCV 94.8 80.0 - 100.0 fL   MCH 33.0 27.0 - 33.0 pg   MCHC 34.8 32.0 - 36.0 g/dL   RDW 12.5 11.0 - 15.0 %   Platelets 181 140 - 400 Thousand/uL   MPV 11.8 7.5 - 12.5 fL   Neutro Abs 3,524 1,500 - 7,800 cells/uL   Lymphs Abs 2,425 850 - 3,900 cells/uL   Absolute Monocytes 395 200 - 950 cells/uL   Eosinophils Absolute 302 15 - 500 cells/uL   Basophils Absolute 54 0 - 200 cells/uL   Neutrophils Relative % 52.6 %   Total Lymphocyte 36.2 %   Monocytes Relative 5.9 %   Eosinophils Relative 4.5 %   Basophils Relative 0.8 %  COMPLETE METABOLIC PANEL WITH GFR  Result Value Ref Range   Glucose, Bld 114 (H) 65 - 99 mg/dL   BUN 10 7 - 25 mg/dL   Creat 0.79 0.70 - 1.33 mg/dL   GFR, Est Non African American 99 > OR = 60 mL/min/1.47m2   GFR, Est African American 115 > OR = 60 mL/min/1.78m2  BUN/Creatinine  Ratio NOT APPLICABLE 6 - 22 (calc)   Sodium 138 135 - 146 mmol/L   Potassium 4.0 3.5 - 5.3 mmol/L   Chloride 104 98 - 110 mmol/L   CO2 25 20 - 32 mmol/L   Calcium 9.6 8.6 - 10.3 mg/dL   Total Protein 6.9 6.1 - 8.1 g/dL   Albumin 4.4 3.6 - 5.1 g/dL   Globulin 2.5 1.9 - 3.7 g/dL (calc)   AG Ratio 1.8 1.0 - 2.5 (calc)   Total Bilirubin 0.6 0.2 - 1.2 mg/dL   Alkaline phosphatase (APISO) 67 35 - 144 U/L   AST 27 10 - 35 U/L   ALT 40 9 - 46 U/L  HgB A1c  Result Value Ref Range   Hgb A1c MFr Bld 7.6 (H) <5.7 % of total Hgb   Mean Plasma Glucose 171 (calc)   eAG (mmol/L) 9.5 (calc)  Lipid Profile  Result Value Ref Range   Cholesterol 140 <200 mg/dL   HDL 38 (L) > OR = 40 mg/dL   Triglycerides 130 <150 mg/dL   LDL Cholesterol (Calc) 79 mg/dL (calc)   Total CHOL/HDL Ratio 3.7 <5.0 (calc)   Non-HDL Cholesterol (Calc) 102 <130 mg/dL (calc)  TSH + free T4  Result Value Ref Range   TSH W/REFLEX TO FT4 0.72 0.40 - 4.50 mIU/L      Assessment & Plan:   Problem List Items Addressed This Visit      Endocrine   Diabetes ([place]) - Primary    ControlledDM with A1c 7.6% improved from 7.8% on 12/14/2019 and goal A1c < 7.0%. - No known complications or hypoglycemia.  Plan:  1. Change therapy: 2. Encourage improved lifestyle: - low carb/low glycemic diet reinforced prior education - Increase physical activity to 30 minutes most days of the week.  Explained that increased physical activity increases body's use of sugar for energy. 3. Check fasting am CBG and log these.  Bring log to next visit for review 4. Continue Statin 5. DM Foot exam done today with out acute findings.   and Advised to schedule DM ophtho exam, send record. 6. Follow-up 3 months      Relevant Medications   metFORMIN (GLUCOPHAGE XR) 750 MG 24 hr tablet     Musculoskeletal and Integument   Toenail fungus    Toenail fungus to all 10 toenails.  Greater toes with darkening of toes towards right side of nail, with  tenderness to palpation.  Thickening of nails with yellowing.  Has tried topical preparations.  Is interested in oral medications to help clear up toe nail fungus.  Discussed medication can be difficult on the liver, that we just had labs done, we can start on terbinafine for 30 days and would need to have labs done every 4 weeks to check liver function over the next 12 weeks while on this medication.  Patient in agreement.  Plan: 1. Begin terbinafine 250mg  tablet 1x per day for the next 30 days. 2. Repeat CMP in 4 weeks 3. RTC in 3 months for re-evaluation      Relevant Medications   terbinafine (LAMISIL) 250 MG tablet      Meds ordered this encounter  Medications  . metFORMIN (GLUCOPHAGE XR) 750 MG 24 hr tablet    Sig: Take 1 tablet (750 mg total) by mouth daily with breakfast.    Dispense:  90 tablet    Refill:  1  . terbinafine (LAMISIL) 250 MG tablet    Sig: Take 1 tablet (  250 mg total) by mouth daily.    Dispense:  30 tablet    Refill:  0     Follow up plan: Return in about 3 months (around 06/13/2020) for DM, A1C, Toenail Fungus F/U.   Harlin Rain, [place] Family Nurse Practitioner [facility] 03/14/2020, 1:14 PM

## 2020-03-14 NOTE — Patient Instructions (Signed)
I have sent in a new prescription for your Metformin.  STOP the 500mg  daily with breakfast.  Start Metformin XR 750mg  daily.  This is a 24 hour tablet and will provide better coverage for a schedule that changes daily.  You can learn more information online about your diabetes at American Diabetes Association: http://www.diabetes.org/ - General self-care (diet, medications, blood sugar checks). - Diet recommendations - There are even recipes available for you to look at and try.  Advice on Protecting Your Feet   1. Daily foot inspections - Look for any breaks in the skin or areas of irritation such as blisters or red areas. Report to primary doctor or foot nurse immediately if any problems occur.  - If you have vision problems and cannot see your feet well or it is hard for you to reach your feet, ask a family member to inspect your feet daily.   2. Daily foot hygiene  - Wash feet daily, but do not soak your feet in hot water. If you do have callus, you may use warm water epsom salt foot soak and use moisturizer after. - Dry well especially between toes; Pat dry do not rub.  - If your skin is dry use a lotion to moisturize but never between the toes.  - If your skin is wet from perspiration, use an antifungal foot powder daily.   3. Shoes and Socks  - Wear a clean pair of socks daily.  - Make sure your shoes fit well and that you are measured and properly fit each time you purchase shoes. Your shoe size may change.  - Powder your shoes with a small amount of an antifungal foot powder daily, as the shoe is the only article of clothing that is not laundered.  - Wear appropriate shoes and socks for the weather. It is especially important to protect your feet from the cold; however, don't forget about sunscreen to the tops of your feet in the hot sun.  - Wear well fitting shoes rather than slippers or flip-flops when walking or standing for long period of time.  - When at home make sure you  always have protective foot wear on your feet.   I have sent in a prescription for Terbinafine to take 1 tablet daily for the next 30 days.  I will plan to check your liver function enzymes every 4 weeks while you are taking this medication before sending in another 30 day supply.  Typically we treat this over 12 weeks (3 months).  Be sure to throw out your nail clippers and nail files when the fungus has grown out to prevent reinfection.  We will plan to see you back in 3 months for diabetes and toenail fungus follow up visit  You will receive a survey after today's visit either digitally by e-mail or paper by [place] mail. Your experiences and feedback matter to [place].  Please respond so we know how we are doing as we provide care for you.  Call us with any questions/concerns/needs.  It is my goal to be available to you for your health concerns.  Thanks for choosing me to be a partner in your healthcare needs!  Harlin Rain, FNP-C Family Nurse Practitioner [facility] Phone: [phone]

## 2020-04-04 ENCOUNTER — Encounter: Payer: Self-pay | Admitting: Family Medicine

## 2020-04-10 ENCOUNTER — Telehealth: Payer: Self-pay

## 2020-04-10 ENCOUNTER — Other Ambulatory Visit: Payer: 59

## 2020-04-10 ENCOUNTER — Other Ambulatory Visit: Payer: Self-pay

## 2020-04-10 DIAGNOSIS — B351 Tinea unguium: Secondary | ICD-10-CM

## 2020-04-10 DIAGNOSIS — Z79899 Other long term (current) drug therapy: Secondary | ICD-10-CM

## 2020-04-10 NOTE — Telephone Encounter (Signed)
The pt came in the office today for a lab draw.

## 2020-04-11 ENCOUNTER — Other Ambulatory Visit: Payer: Self-pay | Admitting: Family Medicine

## 2020-04-11 DIAGNOSIS — B351 Tinea unguium: Secondary | ICD-10-CM

## 2020-04-11 LAB — COMPLETE METABOLIC PANEL WITH GFR
AG Ratio: 1.7 (calc) (ref 1.0–2.5)
ALT: 43 U/L (ref 9–46)
AST: 37 U/L — ABNORMAL HIGH (ref 10–35)
Albumin: 4.5 g/dL (ref 3.6–5.1)
Alkaline phosphatase (APISO): 81 U/L (ref 35–144)
BUN: 14 mg/dL (ref 7–25)
CO2: 26 mmol/L (ref 20–32)
Calcium: 9.4 mg/dL (ref 8.6–10.3)
Chloride: 104 mmol/L (ref 98–110)
Creat: 0.94 mg/dL (ref 0.70–1.33)
GFR, Est African American: 103 mL/min/{1.73_m2} (ref >=60)
GFR, Est Non African American: 89 mL/min/{1.73_m2} (ref >=60)
Globulin: 2.7 g/dL (calc) (ref 1.9–3.7)
Glucose, Bld: 138 mg/dL — ABNORMAL HIGH (ref 65–99)
Potassium: 4 mmol/L (ref 3.5–5.3)
Sodium: 139 mmol/L (ref 135–146)
Total Bilirubin: 0.7 mg/dL (ref 0.2–1.2)
Total Protein: 7.2 g/dL (ref 6.1–8.1)

## 2020-04-11 LAB — HM DIABETES EYE EXAM

## 2020-04-11 MED ORDER — TERBINAFINE HCL 250 MG PO TABS: 250.0000 mg | ORAL_TABLET | Freq: Every day | ORAL | 0 refills | Status: DC

## 2020-05-07 ENCOUNTER — Other Ambulatory Visit: Payer: Self-pay | Admitting: Family Medicine

## 2020-05-07 ENCOUNTER — Encounter: Payer: Self-pay | Admitting: Family Medicine

## 2020-05-07 DIAGNOSIS — B351 Tinea unguium: Secondary | ICD-10-CM

## 2020-05-07 MED ORDER — TERBINAFINE HCL 250 MG PO TABS: 250.0000 mg | ORAL_TABLET | Freq: Every day | ORAL | 0 refills | Status: DC

## 2020-05-07 NOTE — Telephone Encounter (Signed)
Requested medication (s) are due for refill today: yes  Requested medication (s) are on the active medication list: yes  Last refill:  04/11/20 #30 0 refills  Future visit scheduled: yes with urology   Notes to clinic:  no protocol noted. Do you want to continue with refills?      Requested Prescriptions  Pending Prescriptions Disp Refills   terbinafine (LAMISIL) 250 MG tablet [Pharmacy Med Name: TERBINAFINE HCL 250 MG TABLET] 30 tablet 0    Sig: TAKE 1 TABLET BY MOUTH EVERY DAY      Off-Protocol Failed - 05/07/2020 12:30 PM      Failed - Medication not assigned to a protocol, review manually.      Passed - Valid encounter within last 12 months    Recent Outpatient Visits           1 month ago Type 2 diabetes mellitus without complication, without long-term current use of insulin ([place])   [facility], Lupita Raider, FNP   3 months ago COPD exacerbation [facility])   [facility], Lupita Raider, FNP   4 months ago Type 2 diabetes mellitus without complication, without long-term current use of insulin ([place])   [facility], Lupita Raider, FNP   4 months ago Routine medical exam   [facility], Lupita Raider, FNP   6 months ago Mixed hyperlipidemia   [facility], Lupita Raider, FNP       Future Appointments             In 3 months McGowan, Gordan Payment [place]

## 2020-05-29 ENCOUNTER — Other Ambulatory Visit: Payer: Self-pay | Admitting: Family Medicine

## 2020-05-29 DIAGNOSIS — F419 Anxiety disorder, unspecified: Secondary | ICD-10-CM

## 2020-05-29 DIAGNOSIS — F32A Depression, unspecified: Secondary | ICD-10-CM

## 2020-06-03 ENCOUNTER — Other Ambulatory Visit: Payer: Self-pay | Admitting: Family Medicine

## 2020-06-03 DIAGNOSIS — K219 Gastro-esophageal reflux disease without esophagitis: Secondary | ICD-10-CM

## 2020-06-03 NOTE — Telephone Encounter (Signed)
Requested medication (s) are due for refill today: Yes  Requested medication (s) are on the active medication list: Yes  Last refill:  11/30/19  Future visit scheduled: No  Notes to clinic: Prior Authorization needed, see pharmacy notes     Requested Prescriptions  Pending Prescriptions Disp Refills   omeprazole (PRILOSEC) 20 MG capsule [Pharmacy Med Name: OMEPRAZOLE DR 20 MG CAPSULE] 30 capsule 0    Sig: TAKE 1 [place]      Gastroenterology: Proton Pump Inhibitors Passed - 06/03/2020  4:26 PM      Passed - Valid encounter within last 12 months    Recent Outpatient Visits           2 months ago Type 2 diabetes mellitus without complication, without long-term current use of insulin ([place])   [place], FNP   4 months ago COPD exacerbation [facility])   [facility], Lupita Raider, FNP   5 months ago Type 2 diabetes mellitus without complication, without long-term current use of insulin [facility])   [facility], Lupita Raider, FNP   5 months ago Routine medical exam   [facility], Lupita Raider, FNP   6 months ago Mixed hyperlipidemia   [facility], Lupita Raider, FNP       Future Appointments             In 2 months McGowan, Gordan Payment [place]

## 2020-06-05 NOTE — Telephone Encounter (Signed)
Have we received a request for Prior Auth?  TY

## 2020-06-08 ENCOUNTER — Encounter: Payer: Self-pay | Admitting: Family Medicine

## 2020-06-26 ENCOUNTER — Other Ambulatory Visit: Payer: Self-pay | Admitting: *Deleted

## 2020-06-27 ENCOUNTER — Other Ambulatory Visit: Payer: Self-pay | Admitting: Family Medicine

## 2020-06-27 MED ORDER — TESTOSTERONE 20.25 MG/ACT (1.62%) TD GEL
TRANSDERMAL | 5 refills | Status: DC
Start: 2020-06-27 — End: 2020-08-20

## 2020-07-07 ENCOUNTER — Other Ambulatory Visit: Payer: Self-pay | Admitting: Family Medicine

## 2020-07-07 DIAGNOSIS — J410 Simple chronic bronchitis: Secondary | ICD-10-CM

## 2020-07-07 NOTE — Telephone Encounter (Signed)
   Notes to clinic:  medication filled by a historical provider Review for refill    Requested Prescriptions  Pending Prescriptions Disp Refills   ANORO ELLIPTA 62.5-25 MCG/INH AEPB [Pharmacy Med Name: ANORO ELLIPTA 62.5-25 MCG INH] 180 each 3    Sig: INHALE ONE PUFF BY MOUTH DAILY      Pulmonology:  Combination Products Passed - 07/07/2020  1:19 AM      Passed - Valid encounter within last 12 months    Recent Outpatient Visits           3 months ago Type 2 diabetes mellitus without complication, without long-term current use of insulin ([place])   [place], FNP   5 months ago COPD exacerbation [facility])   [facility], Lupita Raider, FNP   6 months ago Type 2 diabetes mellitus without complication, without long-term current use of insulin [facility])   [facility], Lupita Raider, FNP   6 months ago Routine medical exam   [facility], Lupita Raider, FNP   8 months ago Mixed hyperlipidemia   [facility], Lupita Raider, FNP       Future Appointments             In 1 month McGowan, Gordan Payment [place]

## 2020-07-14 ENCOUNTER — Telehealth: Payer: Self-pay | Admitting: Family Medicine

## 2020-07-14 NOTE — Telephone Encounter (Signed)
Would sending them his prostate biopsy report help?

## 2020-07-14 NOTE — Telephone Encounter (Signed)
Patient notified the PA for Testosterone has been denied. The statement is that his PSA has been elevated for over 1 year and they will not approve the medication. Patient would like to know what to do next?

## 2020-07-15 NOTE — Telephone Encounter (Signed)
I faxed the Biopsy report. Waiting on response from insurance company.

## 2020-07-22 NOTE — Telephone Encounter (Signed)
With IV able to do a peer to peer review?

## 2020-07-22 NOTE — Telephone Encounter (Signed)
The insurance company has still denied the testosterone.

## 2020-07-29 NOTE — Telephone Encounter (Signed)
I called patient's insurance company requesting a Peer to Peer. They need a 72 hour turn around time to confirm appointment. I asked for February 15th at 4:00pm.

## 2020-08-05 NOTE — Telephone Encounter (Signed)
I spoke to patient's wife and informed her of the insurance refusing the testosterone medication. The testosterone gel from [employer] is 95.00 the Testosteron Cypionate for 4 vials is 19.82. Do you think he can change the therapy?

## 2020-08-07 ENCOUNTER — Other Ambulatory Visit: Payer: Self-pay | Admitting: Family Medicine

## 2020-08-11 ENCOUNTER — Ambulatory Visit: Payer: 59 | Admitting: Urology
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
## 2020-08-19 ENCOUNTER — Other Ambulatory Visit: Payer: Self-pay

## 2020-08-19 ENCOUNTER — Encounter: Payer: Self-pay | Admitting: Urology

## 2020-08-19 ENCOUNTER — Ambulatory Visit (INDEPENDENT_AMBULATORY_CARE_PROVIDER_SITE_OTHER): Payer: BLUE CROSS/BLUE SHIELD | Admitting: Urology

## 2020-08-19 ENCOUNTER — Ambulatory Visit: Payer: Self-pay | Admitting: Urology

## 2020-08-19 VITALS — BP 126/85 | HR 69 | Ht 65.0 in | Wt 163.9 lb

## 2020-08-19 DIAGNOSIS — E349 Endocrine disorder, unspecified: Secondary | ICD-10-CM

## 2020-08-19 DIAGNOSIS — N401 Enlarged prostate with lower urinary tract symptoms: Secondary | ICD-10-CM | POA: Diagnosis not present

## 2020-08-19 DIAGNOSIS — N529 Male erectile dysfunction, unspecified: Secondary | ICD-10-CM

## 2020-08-19 DIAGNOSIS — R972 Elevated prostate specific antigen [PSA]: Secondary | ICD-10-CM | POA: Diagnosis not present

## 2020-08-19 DIAGNOSIS — N138 Other obstructive and reflux uropathy: Secondary | ICD-10-CM

## 2020-08-19 LAB — URINALYSIS, COMPLETE
Bilirubin, UA: NEGATIVE
Glucose, UA: NEGATIVE
Ketones, UA: NEGATIVE
Leukocytes,UA: NEGATIVE
Nitrite, UA: NEGATIVE
Protein,UA: NEGATIVE
RBC, UA: NEGATIVE
Specific Gravity, UA: 1.015 (ref 1.005–1.030)
Urobilinogen, Ur: 1 mg/dL (ref 0.2–1.0)
pH, UA: 8.5 — ABNORMAL HIGH (ref 5.0–7.5)

## 2020-08-19 LAB — MICROSCOPIC EXAMINATION
Bacteria, UA: NONE SEEN
Crystal Type: NONE SEEN
Crystals: NONE SEEN
RBC, Urine: NONE SEEN /hpf (ref 0–2)
Renal Epithel, UA: NONE SEEN /hpf
Trichomonas, UA: NONE SEEN
Yeast, UA: NONE SEEN

## 2020-08-20 ENCOUNTER — Telehealth: Payer: Self-pay | Admitting: Family Medicine

## 2020-08-20 ENCOUNTER — Encounter: Payer: Self-pay | Admitting: Family Medicine

## 2020-08-20 ENCOUNTER — Ambulatory Visit (INDEPENDENT_AMBULATORY_CARE_PROVIDER_SITE_OTHER): Payer: BLUE CROSS/BLUE SHIELD | Admitting: Family Medicine

## 2020-08-20 ENCOUNTER — Other Ambulatory Visit: Payer: Self-pay | Admitting: Urology

## 2020-08-20 VITALS — BP 117/78 | HR 67 | Ht 65.0 in | Wt 164.4 lb

## 2020-08-20 DIAGNOSIS — E1169 Type 2 diabetes mellitus with other specified complication: Secondary | ICD-10-CM

## 2020-08-20 DIAGNOSIS — F419 Anxiety disorder, unspecified: Secondary | ICD-10-CM

## 2020-08-20 DIAGNOSIS — E663 Overweight: Secondary | ICD-10-CM

## 2020-08-20 DIAGNOSIS — G4726 Circadian rhythm sleep disorder, shift work type: Secondary | ICD-10-CM

## 2020-08-20 DIAGNOSIS — J410 Simple chronic bronchitis: Secondary | ICD-10-CM | POA: Diagnosis not present

## 2020-08-20 DIAGNOSIS — K219 Gastro-esophageal reflux disease without esophagitis: Secondary | ICD-10-CM | POA: Diagnosis not present

## 2020-08-20 DIAGNOSIS — L608 Other nail disorders: Secondary | ICD-10-CM

## 2020-08-20 DIAGNOSIS — F32A Depression, unspecified: Secondary | ICD-10-CM

## 2020-08-20 DIAGNOSIS — E785 Hyperlipidemia, unspecified: Secondary | ICD-10-CM

## 2020-08-20 LAB — HEMOGLOBIN: Hemoglobin: 14.3 g/dL (ref 13.0–17.7)

## 2020-08-20 LAB — POCT GLYCOSYLATED HEMOGLOBIN (HGB A1C): Hemoglobin A1C: 6.8 % — AB (ref 4.0–5.6)

## 2020-08-20 LAB — PSA: Prostate Specific Ag, Serum: 4.7 ng/mL — ABNORMAL HIGH (ref 0.0–4.0)

## 2020-08-20 LAB — TESTOSTERONE: Testosterone: 461 ng/dL (ref 264–916)

## 2020-08-20 LAB — HEMATOCRIT: Hematocrit: 41.2 % (ref 37.5–51.0)

## 2020-08-20 MED ORDER — OMEPRAZOLE 20 MG PO CPDR: 20.0000 mg | DELAYED_RELEASE_CAPSULE | Freq: Every day | ORAL | 3 refills | Status: DC

## 2020-08-20 MED ORDER — SYRINGE 2-3 ML 3 ML MISC: 1.0000 mg | 3 refills | Status: DC

## 2020-08-20 MED ORDER — TESTOSTERONE CYPIONATE 200 MG/ML IM SOLN
200.0000 mg | INTRAMUSCULAR | 0 refills | Status: DC
Start: 2020-08-20 — End: 2020-08-20

## 2020-08-20 MED ORDER — ATORVASTATIN CALCIUM 20 MG PO TABS: ORAL_TABLET | ORAL | 3 refills | Status: DC

## 2020-08-20 MED ORDER — "BD DISP NEEDLES 18G X 1-1/2"" MISC": 1.0000 mg | 0 refills | Status: DC

## 2020-08-20 MED ORDER — METFORMIN HCL ER 750 MG PO TB24
750.0000 mg | ORAL_TABLET | Freq: Every day | ORAL | 3 refills | Status: DC
Start: 2020-08-20 — End: 2021-05-30

## 2020-08-20 MED ORDER — ESCITALOPRAM OXALATE 10 MG PO TABS
10.0000 mg | ORAL_TABLET | Freq: Every day | ORAL | 3 refills | Status: DC
Start: 2020-08-20 — End: 2021-08-25

## 2020-08-20 MED ORDER — ANORO ELLIPTA 62.5-25 MCG/INH IN AEPB: 1.0000 | INHALATION_SPRAY | Freq: Every day | RESPIRATORY_TRACT | 3 refills | Status: DC

## 2020-08-20 MED ORDER — TESTOSTERONE CYPIONATE 200 MG/ML IM SOLN: 200.0000 mg | INTRAMUSCULAR | 0 refills | Status: DC

## 2020-08-20 MED ORDER — "BD DISP NEEDLES 21G X 1-1/2"" MISC": 1.0000 mg | 0 refills | Status: DC

## 2020-08-20 NOTE — Patient Instructions (Addendum)
Thank you for coming to the office today.    Sleep Hygiene Recommendations to promote healthy sleep in all patients, especially if symptoms of insomnia are worsening. Due to the nature of sleep rhythms, if your body gets "out of rhythm", it may take some time before your sleep cycle can be "reset".  Please try to follow as many of the following tips as you can, usually there are only a few of these are the primary cause of the problem.  ?To reset your sleep rhythm, go to bed and get up at the same time every day ?Sleep only long enough to feel rested and then get out of bed ?Do not try to force yourself to sleep. If you can't sleep, get out of bed and try again later. ?Avoid naps during the day, unless excessively tired. The more sleeping during the day, then the less sleep your body needs at night.  ?Have coffee, tea, and other foods that have caffeine only in the morning ?Exercise several days a week, but not right before bed ?If you drink alcohol, prefer to have appropriate drink with one meal, but prefer to avoid alcohol in the evening, and bedtime ?If you smoke, avoid smoking, especially in the evening  ?Avoid watching TV or looking at phones, computers, or reading devices ("e-books") that give off light at least 30 minutes before bed. This artificial light sends "awake signals" to your brain and can make it harder to fall asleep. ?Make your bedroom a comfortable place where it is easy to fall asleep: ? Put up shades or special blackout curtains to block light from outside. ? Use a white noise machine to block noise. ? Keep the temperature cool. ?Try your best to solve or at least address your problems before you go to bed ?Use relaxation techniques to manage stress. Ask your health care provider to suggest some techniques that may work well for you. These may include: ? Breathing exercises. ? Routines to release muscle tension. ? Visualizing peaceful  scenes.  ------------------------------------------------------------  Start with Melatonin 1mg  up to 5mg  as needed, goal to get up to 10mg .  Big concern as discussed today with Shift Work Sleep Disorder is majorly impacting you.  Future we can consider Trazodone in future as a rx sleeping medication.  Prevagen is fine to try for memory but may not be the solution.  One A Day - Men's >50age.    Please schedule a Follow-up Appointment to: Return in about 6 months (around 02/20/2021) for 6 month Annual Physical AM apt fasting lab AFTER / Urine Microalbumin.  If you have any other questions or concerns, please feel free to call the office or send a message through [facility]. You may also schedule an earlier appointment if necessary.  Additionally, you may be receiving a survey about your experience at our office within a few days to 1 week by e-mail or mail. We value your feedback.  [name], DO [place]

## 2020-08-20 NOTE — Assessment & Plan Note (Signed)
Chronic GERD Refill Omeprazole 20mg  daily

## 2020-08-20 NOTE — Progress Notes (Signed)
Subjective:    Patient ID: Brian Wolfe, male    DOB: [birth year], 60 y.o.   MRN: [id number]  Brian Wolfe is a 60 y.o. male presenting on 08/20/2020 for Diabetes and Insomnia   HPI   CHRONIC DM, Type 2: Reports no concerns overall Prior A1c 7.6 to 7.8 range. Now due today for A1c Home CBG readings 130-170 Meds: Metformin XR 750mg  daily Reports good compliance. 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   Insomnia Difficulty falling asleep. Also problem with maintaining sleep. He describes since brother has passed away, he has had issues with getting to sleep. He says cannot turn brain off. 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 Not tried melatonin or other sleeping medication Denies breathing or pain or other issues related to sleep.  Thickened Toenails / Overgrown History of onychomycosis with abnormal toenails In past tried oral medication, no results. Also has had prior toenail removed but it has grown back with deformity and abnormal appearance.   Depression screen [facility] 2/9 12/17/2019 11/09/2019 03/27/2019  Decreased Interest 0 0 0  Down, Depressed, Hopeless 0 0 0  PHQ - 2 Score 0 0 0  Altered sleeping 0 0 -  Tired, decreased energy 0 0 -  Change in appetite 0 0 -  Feeling bad or failure about yourself  0 0 -  Trouble concentrating 0 0 -  Moving slowly or fidgety/restless 0 0 -  Suicidal thoughts 0 0 -  PHQ-9 Score 0 0 -  Difficult doing work/chores Not difficult at all Not difficult at all -    Social History   Tobacco Use  . Smoking status: Current Every Day Smoker    Packs/day: 1.00    Years: 40.00    Pack years: 40.00    Types: Cigarettes  . Smokeless tobacco: Never Used  Vaping Use  . Vaping Use: Never used  Substance Use Topics  .  Alcohol use: No    Alcohol/week: 0.0 standard drinks  . Drug use: No    Review of Systems Per HPI unless specifically indicated above     Objective:    BP 117/78   Pulse 67   Ht 5\' 5"  (1.651 m)   Wt 164 lb 6.4 oz (74.6 kg)   SpO2 95%   BMI 27.36 kg/m   Wt Readings from Last 3 Encounters:  08/20/20 164 lb 6.4 oz (74.6 kg)  08/19/20 163 lb 14.4 oz (74.3 kg)  03/14/20 165 lb 3.2 oz (74.9 kg)    Physical Exam Vitals and nursing note reviewed.  Constitutional:      General: He is not in acute distress.    Appearance: He is well-developed and well-nourished. He is not diaphoretic.     Comments: Well-appearing, comfortable, cooperative  HENT:     Head: Normocephalic and atraumatic.     Mouth/Throat:     Mouth: Oropharynx is clear and moist.  Eyes:     General:        Right eye: No discharge.        Left eye: No discharge.     Conjunctiva/sclera: Conjunctivae normal.  Cardiovascular:     Rate and Rhythm: Normal rate.  Pulmonary:     Effort: Pulmonary effort is normal.  Musculoskeletal:        General:  No edema.  Skin:    General: Skin is warm and dry.     Findings: No erythema or rash.     Comments: Abnormal thickened toenails bilateral great toes.  Neurological:     Mental Status: He is alert and oriented to person, place, and time.  Psychiatric:        Mood and Affect: Mood and affect normal.        Behavior: Behavior normal.     Comments: Well groomed, good eye contact, normal speech and thoughts       Results for orders placed or performed in visit on 08/20/20  POCT HgB A1C  Result Value Ref Range   Hemoglobin A1C 6.8 (A) 4.0 - 5.6 %      Assessment & Plan:   Problem List Items Addressed This Visit    Type 2 diabetes mellitus with other specified complication ([place]) - Primary    Well-controlled DM with A1c 6.8 improved Complications - hyperlipidemia, GERD  Plan:  1. Continue current therapy - Metformin XR 750mg  daily 2. Encourage improved lifestyle  - low carb, low sugar diet, reduce portion size, continue improving regular exercise 3. Check CBG , bring log to next visit for review 4. Continue Statin 5. Urine microalbumin send out, not on ACE ARB      Relevant Medications   metFORMIN (GLUCOPHAGE XR) 750 MG 24 hr tablet   atorvastatin (LIPITOR) 20 MG tablet   Other Relevant Orders   POCT HgB A1C (Completed)   Microalbumin, urine   Ambulatory referral to Podiatry   Simple chronic bronchitis (HCC)    Chronic problem Secondary to smoking Refill Anoro 90 day      Relevant Medications   ANORO ELLIPTA 62.5-25 MCG/INH AEPB   Shift work sleep disorder    Chronic problem Due to inconsistent work schedule and long hours Some mixed insomnia symptoms On SSRI already Counseling on Sleep Hygiene and handout given Add melatonin OTC 1-5mg  then up to 10mg  max Future consider Trazodone if indicated, 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 Continue current meds - Atorvastatin 20mg   2. Encourage improved lifestyle - low carb/cholesterol, reduce portion size, continue improving regular exercise      Relevant Medications   metFORMIN (GLUCOPHAGE XR) 750 MG 24 hr tablet   atorvastatin (LIPITOR) 20 MG tablet   GERD (gastroesophageal reflux disease)    Chronic GERD Refill Omeprazole 20mg  daily      Relevant Medications   omeprazole (PRILOSEC) 20 MG capsule   Anxiety and depression    Controlled Previous history on Sertraline Refill Escitalopram 10mg       Relevant Medications   escitalopram (LEXAPRO) 10 MG tablet    Other Visit Diagnoses    Toenail deformity       Relevant Orders   Ambulatory referral to Podiatry      referral to podiatry for multiple toenail issues, bilateral great toenails with thickening and discoloration,  concern for onychomycosis, has failed oral therapy in past, also has had prior toenail removed but it has grown back in separate pieces, abnormal nail bed and causes pain. Patient has diabetes.   Orders Placed This Encounter  Procedures  . Microalbumin, urine  . Ambulatory referral to Podiatry    Referral Priority:   Routine    Referral  Type:   Consultation    Referral Reason:   Specialty Services Required    Requested Specialty:   Podiatry    Number of Visits Requested:   1  . POCT HgB A1C     Meds ordered this encounter  Medications  . metFORMIN (GLUCOPHAGE XR) 750 MG 24 hr tablet    Sig: Take 1 tablet (750 mg total) by mouth daily with breakfast.    Dispense:  90 tablet    Refill:  3  . atorvastatin (LIPITOR) 20 MG tablet    Sig: Take 1 tablet daily    Dispense:  90 tablet    Refill:  3  . escitalopram (LEXAPRO) 10 MG tablet    Sig: Take 1 tablet (10 mg total) by mouth daily.    Dispense:  90 tablet    Refill:  3  . omeprazole (PRILOSEC) 20 MG capsule    Sig: Take 1 capsule (20 mg total) by mouth daily before breakfast.    Dispense:  90 capsule    Refill:  3  . ANORO ELLIPTA 62.5-25 MCG/INH AEPB    Sig: Inhale 1 puff into the lungs daily.    Dispense:  180 each    Refill:  3    90 day     Follow up plan: Return in about 6 months (around 02/20/2021) for 6 month Annual Physical AM apt fasting lab AFTER / Urine Microalbumin.   Urine Microalbumin Sent out today  [name], [facility] 08/20/2020, 9:01 AM

## 2020-08-20 NOTE — Telephone Encounter (Signed)
Patient's wife notified and will send a Mychart message with the decision on Testosterone injections.

## 2020-08-20 NOTE — Assessment & Plan Note (Signed)
Controlled cholesterol on statin lifestyle Last lipid panel 2021  Plan: 1. Continue current meds - Atorvastatin 20mg   2. Encourage improved lifestyle - low carb/cholesterol, reduce portion size, continue improving regular exercise

## 2020-08-20 NOTE — Telephone Encounter (Signed)
-----   Message from Nori Riis, PA-C sent at 08/20/2020  8:13 AM EST ----- Please let Brian Wolfe know that his PSA is still above normal, so we will need to send in the script for the testosterone cypionate to [facility].  Is he wanting to go forward with this?

## 2020-08-20 NOTE — Assessment & Plan Note (Signed)
Well-controlled DM with A1c 6.8 improved Complications - hyperlipidemia, GERD  Plan:  1. Continue current therapy - Metformin XR 750mg  daily 2. Encourage improved lifestyle - low carb, low sugar diet, reduce portion size, continue improving regular exercise 3. Check CBG , bring log to next visit for review 4. Continue Statin 5. Urine microalbumin send out, not on ACE ARB

## 2020-08-20 NOTE — Assessment & Plan Note (Signed)
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

## 2020-08-20 NOTE — Telephone Encounter (Signed)
Patient states he wants to start the Testosterone Cypionate injections. He wants [facility] to [facility] in [place]. The [facility] is [facility] right now at [facility]. He still has some of the Androgel so he will finish that and schedule an appointment once he is out of the gel for the injection teaching.

## 2020-08-20 NOTE — Assessment & Plan Note (Signed)
Chronic problem Secondary to smoking Refill Anoro 90 day

## 2020-08-20 NOTE — Assessment & Plan Note (Signed)
Controlled Previous history on Sertraline Refill Escitalopram 10mg 

## 2020-08-21 ENCOUNTER — Ambulatory Visit: Payer: Self-pay | Admitting: Urology

## 2020-08-22 ENCOUNTER — Ambulatory Visit: Payer: Self-pay | Admitting: Urology

## 2020-09-23 ENCOUNTER — Ambulatory Visit: Payer: Self-pay | Admitting: Podiatry

## 2020-10-02 NOTE — Telephone Encounter (Signed)
I tried to reach out to Dr. Posey Pronto, but the phone call went to voicemail and there was no identifiers to state it was her voicemail so I did not leave a message

## 2020-10-02 NOTE — Telephone Encounter (Signed)
Dr. Posey Pronto called to speak to you in regards to patient's testosterone PA clinical review. She states she can only speak with Brian Wolfe, PAC call back number [phone]

## 2020-10-06 ENCOUNTER — Ambulatory Visit: Payer: Self-pay | Admitting: Podiatry

## 2020-10-06 NOTE — Telephone Encounter (Signed)
Dr. Posey Pronto left a vmail on triage line to review PA clinical info  Message left on 10-03-20 office closed

## 2020-10-07 ENCOUNTER — Ambulatory Visit: Payer: BLUE CROSS/BLUE SHIELD | Admitting: Urology

## 2020-10-13 ENCOUNTER — Other Ambulatory Visit: Payer: Self-pay

## 2020-10-13 ENCOUNTER — Encounter: Payer: Self-pay | Admitting: Podiatry

## 2020-10-13 ENCOUNTER — Ambulatory Visit: Payer: BLUE CROSS/BLUE SHIELD | Admitting: Podiatry

## 2020-10-13 DIAGNOSIS — L600 Ingrowing nail: Secondary | ICD-10-CM

## 2020-10-13 DIAGNOSIS — B351 Tinea unguium: Secondary | ICD-10-CM | POA: Diagnosis not present

## 2020-10-13 DIAGNOSIS — M79676 Pain in unspecified toe(s): Secondary | ICD-10-CM | POA: Diagnosis not present

## 2020-10-13 DIAGNOSIS — L603 Nail dystrophy: Secondary | ICD-10-CM

## 2020-10-13 MED ORDER — NEOMYCIN-POLYMYXIN-HC 1 % OT SOLN: OTIC | 1 refills | Status: DC

## 2020-10-13 NOTE — Patient Instructions (Signed)

## 2020-10-13 NOTE — Progress Notes (Signed)
Brian Wolfe is a 60 y.o.  male with testosterone deficiency who presents today for instruction on delivering an IM injection of testosterone cypionate.    I instructed the patient to identify the concentration of his testosterone. Testosterone for injection is usually in the form of testosterone cypionate. These liquids come in multiple concentrations, so before giving an injection, it's very important to make sure that his intended dosage takes into account the concentration of the testosterone serum. Usually, testosterone comes in a concentration of either 100 mg/ml or 200 mg/ml.  We typically use the 200 mg/mL in this office.  He has the 200 mg/mL (4mL) vial today.  Lot#[id number] exp. 02/2023.  Using a sterile, 18 G needle and 3 cc syringe, the testosterone cypionate was drawn up for a 1  cc injection.  To draw up the dose, I demonstrated how to first draw air into the syringe equal to the volume of the dosage. Then, wipe the top of the medication bottle with an alcohol wipe, insert the needle through the lid and into the medication, and push the air from your syringe into the bottle. Turn the bottle upside down and draw out the exact dosage of testosterone.  I demonstrated how to aspirate the syringe by hinge the syringe with its needle uncapped and pointing up in front of him.  Looking for air bubbles in the syringe. Flick the side of the syringe to get these bubbles to rise to the top.    When the dosage is bubble-free, I slowly depressed the plunger to force the air at the top of the syringe out stopping when a tiny drop of medication comes out of the tip of the syringe.  I advised him to be certain no air remained in the syringe as injecting air is very dangerous.  Being careful not to squirt or spray a significant portion of the dosage onto the floor.  Preparing the injection site, outer middle third of the vastus lateralis muscle of the thigh, I took a sterile alcohol pad and wipe the  immediate area around where I intended him to inject.   I then demonstrated how to change the needle from the 18 G to the 21 G needle.  I then gave him the syringe for injecting.  He correctly identified the injection location.  He held the syringe like a dart at a 90-degree angle above the sterile injection site. Quickly plunged it into the flesh. Before depressing the plunger, he drew back on it slightly and no blood was seen.  I advised him that if blood flashed in the syringe, he would need to remove the needle and then select a different location as he was in the vein.  Inject the medication at a steady, controlled pace.  He fully depressed the plunger, slowly pull the needle out. Pressing around the injection site with a sterile cotton swab as he did so - this preventing the emerging needle from pulling on the skin and causing extra pain.  We assessed the needle entry point for bleeding, and applied a sterile Band-Aid and/or cotton swab if needed. Disposed of the used needle and syringe in a proper sharps container.  I advised him to acquire a sharps container for his personal use at home.  I advised him that If, after injection, he experienced redness, swelling, or discomfort beyond that of normal soreness at the site of injection, call our office for an appointment and instructions.  He is to always store his  medication at the recommended temperature, and always check the expiration date on the bottle. If it's expired, don't use it.  Of course, keep all of med's out of reach of children.  Do not change his dose without consulting your provider.  His starting dose will be 1 cc every 2 weeks.  He will return 1 week after his fourth injection for a testosterone, PSA, HCT and hemoglobin level.   Brian Ghattas, PA-C   I spent 15 minutes on the day of the encounter to include pre-visit record review, face-to-face time with the patient, and post-visit ordering of tests.
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
## 2020-10-14 ENCOUNTER — Ambulatory Visit: Payer: BLUE CROSS/BLUE SHIELD | Admitting: Urology

## 2020-10-14 ENCOUNTER — Encounter: Payer: Self-pay | Admitting: Urology

## 2020-10-14 VITALS — BP 133/76 | HR 64 | Ht 65.0 in | Wt 165.5 lb

## 2020-10-14 DIAGNOSIS — E349 Endocrine disorder, unspecified: Secondary | ICD-10-CM

## 2020-10-14 MED ORDER — "BD DISP NEEDLES 21G X 1-1/2"" MISC": 1.0000 mg | 0 refills | Status: DC

## 2020-10-14 MED ORDER — SYRINGE 2-3 ML 3 ML MISC: 1.0000 mg | 3 refills | Status: DC

## 2020-10-16 ENCOUNTER — Encounter: Payer: Self-pay | Admitting: Podiatry

## 2020-10-22 ENCOUNTER — Encounter: Payer: Self-pay | Admitting: Podiatry

## 2020-11-14 ENCOUNTER — Encounter: Payer: Self-pay | Admitting: Podiatry

## 2020-11-25 ENCOUNTER — Ambulatory Visit: Payer: BLUE CROSS/BLUE SHIELD | Admitting: Podiatry

## 2020-11-25 ENCOUNTER — Other Ambulatory Visit: Payer: Self-pay

## 2020-11-25 ENCOUNTER — Encounter: Payer: Self-pay | Admitting: Podiatry

## 2020-11-25 DIAGNOSIS — L03031 Cellulitis of right toe: Secondary | ICD-10-CM | POA: Diagnosis not present

## 2020-11-25 DIAGNOSIS — E1169 Type 2 diabetes mellitus with other specified complication: Secondary | ICD-10-CM

## 2020-11-25 MED ORDER — DOXYCYCLINE HYCLATE 100 MG PO TABS: 100.0000 mg | ORAL_TABLET | Freq: Two times a day (BID) | ORAL | 0 refills | Status: DC

## 2020-11-26 ENCOUNTER — Ambulatory Visit: Payer: BLUE CROSS/BLUE SHIELD | Admitting: Podiatry

## 2020-11-26 ENCOUNTER — Telehealth: Payer: Self-pay | Admitting: Podiatry

## 2020-11-26 ENCOUNTER — Other Ambulatory Visit: Payer: Self-pay

## 2020-11-26 MED ORDER — DOXYCYCLINE HYCLATE 100 MG PO TABS: 100.0000 mg | ORAL_TABLET | Freq: Two times a day (BID) | ORAL | 0 refills | Status: AC

## 2020-11-26 NOTE — Telephone Encounter (Signed)
Please resend prescription for antibiotic. Patient called pharmacy for pick up and they stated there was nothing for the patient (no rx in their system). I called to double check and the pharmacy doesn't not have the prescription. Please resend.

## 2020-11-27 ENCOUNTER — Other Ambulatory Visit: Payer: Self-pay

## 2020-11-27 ENCOUNTER — Other Ambulatory Visit: Payer: BLUE CROSS/BLUE SHIELD

## 2020-11-27 ENCOUNTER — Encounter: Payer: Self-pay | Admitting: Podiatry

## 2020-11-27 DIAGNOSIS — E349 Endocrine disorder, unspecified: Secondary | ICD-10-CM

## 2020-11-27 DIAGNOSIS — R972 Elevated prostate specific antigen [PSA]: Secondary | ICD-10-CM

## 2020-11-27 NOTE — Progress Notes (Signed)
Subjective:  Patient ID: Brian Wolfe, male    DOB: November 22, 1960,  MRN: [id number]  Chief Complaint  Patient presents with   Nail Problem    Nail removal follow up PT stated that he is still having drainage and pain with his toe     60 y.o. male presents with the above complaint.  Patient presents with complaint of right hallux wound status post a total nail avulsion.  Patient states that he had a nail avulsion done by Dr. Milinda Pointer.  Patient states that it is painful and sore to touch.  Has not gotten better.  They have been doing soaking they have been doing some gentamicin drops.  They said that there is some drainage.  There is pain associated with it.  Patient is a diabetic with last A1c of 6.8.  He has not taken any antibiotics.    Review of Systems: Negative except as noted in the HPI. Denies N/V/F/Ch.  Past Medical History:  Diagnosis Date   Anxiety    BPH with obstruction/lower urinary tract symptoms    Depression    Elevated PSA    Fatty infiltration of liver    GERD (gastroesophageal reflux disease)    Headache    High grade prostatic intraepithelial neoplasia    Hypogonadism in male    NAFLD (nonalcoholic fatty liver disease)    Skin abscess     Current Outpatient Medications:    amoxicillin (AMOXIL) 500 MG capsule, Take 500 mg by mouth 3 (three) times daily., Disp: , Rfl:    ANORO ELLIPTA 62.5-25 MCG/INH AEPB, Inhale 1 puff into the lungs daily., Disp: 180 each, Rfl: 3   atorvastatin (LIPITOR) 20 MG tablet, Take 1 tablet daily, Disp: 90 tablet, Rfl: 3   blood glucose meter kit and supplies, Dispense based on patient and insurance preference. Use up to four times daily as directed. (FOR ICD-10 E10.9, E11.9)., Disp: 1 each, Rfl: 0   chlorhexidine (PERIDEX) 0.12 % solution, SMARTSIG:By Mouth, Disp: , Rfl:    doxycycline (VIBRA-TABS) 100 MG tablet, Take 1 tablet (100 mg total) by mouth 2 (two) times daily for 14 days., Disp: 28 tablet, Rfl: 0   doxycycline (VIBRA-TABS)  100 MG tablet, Take 1 tablet (100 mg total) by mouth 2 (two) times daily for 14 days., Disp: 28 tablet, Rfl: 0   escitalopram (LEXAPRO) 10 MG tablet, Take 1 tablet (10 mg total) by mouth daily., Disp: 90 tablet, Rfl: 3   HYDROcodone-acetaminophen (NORCO) 7.5-325 MG tablet, Take 1 tablet by mouth 3 (three) times daily as needed., Disp: , Rfl:    ibuprofen (ADVIL) 800 MG tablet, Take 800 mg by mouth every 8 (eight) hours as needed., Disp: , Rfl:    metFORMIN (GLUCOPHAGE XR) 750 MG 24 hr tablet, Take 1 tablet (750 mg total) by mouth daily with breakfast., Disp: 90 tablet, Rfl: 3   NEEDLE, DISP, 18 G (BD DISP NEEDLES) 18G X 1-1/2" MISC, 1 mg by Does not apply route every 14 (fourteen) days., Disp: 50 each, Rfl: 0   NEEDLE, DISP, 21 G (BD DISP NEEDLES) 21G X 1-1/2" MISC, 1 mg by Does not apply route every 14 (fourteen) days., Disp: 50 each, Rfl: 0   NEOMYCIN-POLYMYXIN-HYDROCORTISONE (CORTISPORIN) 1 % SOLN OTIC solution, Apply 1-2 drops to toe BID after soaking, Disp: 10 mL, Rfl: 1   omeprazole (PRILOSEC) 20 MG capsule, Take 1 capsule (20 mg total) by mouth daily before breakfast., Disp: 90 capsule, Rfl: 3   PROAIR HFA 108 (90 Base)  MCG/ACT inhaler, TAKE 2 PUFFS BY MOUTH EVERY 6 HOURS AS NEEDED FOR WHEEZE OR SHORTNESS OF BREATH, Disp: 8.5 g, Rfl: 2   Syringe, Disposable, (2-3CC SYRINGE) 3 ML MISC, 1 mg by Does not apply route every 14 (fourteen) days., Disp: 25 each, Rfl: 3   testosterone cypionate (DEPOTESTOSTERONE CYPIONATE) 200 MG/ML injection, Inject 1 mL (200 mg total) into the muscle every 14 (fourteen) days., Disp: 10 mL, Rfl: 0  Social History   Tobacco Use  Smoking Status Every Day   Packs/day: 1.00   Years: 40.00   Pack years: 40.00   Types: Cigarettes  Smokeless Tobacco Never    Allergies  Allergen Reactions   Augmentin [Amoxicillin-Pot Clavulanate] Hives    Patient had a recent course of Augmentin 01/2020 and tolerated    Bactrim [Sulfamethoxazole-Trimethoprim] Nausea And Vomiting    Objective:  There were no vitals filed for this visit. There is no height or weight on file to calculate BMI. Constitutional Well developed. Well nourished.  Vascular Dorsalis pedis pulses palpable bilaterally. Posterior tibial pulses palpable bilaterally. Capillary refill normal to all digits.  No cyanosis or clubbing noted. Pedal hair growth normal.  Neurologic Normal speech. Oriented to person, place, and time. Epicritic sensation to light touch grossly present bilaterally.  Dermatologic Paronychia/redness noted around the right hallux.  No regrowing of the nail noted.  The wound bed is fibrogranular.  Macerated skin tissue noted circumferentially.  No exposure of bone noted.  No purulent drainage noted.  Orthopedic: Normal joint ROM without pain or crepitus bilaterally. No visible deformities. No bony tenderness.   Radiographs: None Assessment:   1. Paronychia of great toe, right   2. Type 2 diabetes mellitus with other specified complication, without long-term current use of insulin ([place])    Plan:  Patient was evaluated and treated and all questions answered.  Right hallux paronychia status post total nail avulsion -I explained patient the etiology of paronychia and various treatment options were discussed.  The likely culprit to this might be over soaking it leading to macerated skin tissue and not allowing the wound bed to heal appropriately.  I believe patient will benefit from Betadine wet-to-dry dressing changes have asked him to do once a day. -He will also wear surgical shoe to offload the dorsal aspect of the digit. -Doxycycline was dispensed for skin and soft tissue prophylaxis as well -If the infection worsens or progresses I asked him go to the emergency room or come see [place] right away.  He states understanding  No follow-ups on file.

## 2020-11-28 LAB — TESTOSTERONE: Testosterone: >1500 ng/dL — ABNORMAL HIGH (ref 264–916)

## 2020-11-28 LAB — PSA: Prostate Specific Ag, Serum: 6 ng/mL — ABNORMAL HIGH (ref 0.0–4.0)

## 2020-11-28 LAB — HEMOGLOBIN AND HEMATOCRIT, BLOOD
Hematocrit: 41.9 % (ref 37.5–51.0)
Hemoglobin: 14.5 g/dL (ref 13.0–17.7)

## 2020-12-02 ENCOUNTER — Other Ambulatory Visit: Payer: Self-pay

## 2020-12-29 ENCOUNTER — Other Ambulatory Visit: Payer: Self-pay

## 2020-12-29 ENCOUNTER — Other Ambulatory Visit: Payer: BLUE CROSS/BLUE SHIELD

## 2020-12-29 DIAGNOSIS — E349 Endocrine disorder, unspecified: Secondary | ICD-10-CM

## 2020-12-29 DIAGNOSIS — R972 Elevated prostate specific antigen [PSA]: Secondary | ICD-10-CM

## 2020-12-30 ENCOUNTER — Ambulatory Visit: Payer: BLUE CROSS/BLUE SHIELD | Admitting: Podiatry

## 2020-12-30 ENCOUNTER — Encounter: Payer: Self-pay | Admitting: Podiatry

## 2020-12-30 DIAGNOSIS — L03031 Cellulitis of right toe: Secondary | ICD-10-CM

## 2020-12-30 DIAGNOSIS — E1169 Type 2 diabetes mellitus with other specified complication: Secondary | ICD-10-CM

## 2020-12-30 LAB — HEMOGLOBIN AND HEMATOCRIT, BLOOD
Hematocrit: 44 % (ref 37.5–51.0)
Hemoglobin: 15.1 g/dL (ref 13.0–17.7)

## 2020-12-30 LAB — TESTOSTERONE: Testosterone: 345 ng/dL (ref 264–916)

## 2020-12-30 LAB — PSA: Prostate Specific Ag, Serum: 4.5 ng/mL — ABNORMAL HIGH (ref 0.0–4.0)

## 2020-12-30 NOTE — Progress Notes (Signed)
Subjective:  Patient ID: Brian Wolfe, male    DOB: 1961-05-01,  MRN: [id number]  Chief Complaint  Patient presents with   Nail Problem    Right hallux nail  PT stated that he still has some pain and drainage but its not as bad as it was     60 y.o. male presents with the above complaint.  Patient presents for follow-up to right hallux wound status post nail avulsion done by Dr. Milinda Pointer.  Patient states is doing a lot better.  He was doing dressing changes which helped a lot.  He is a diabetic with last A1c of 6.8.  He denies any other acute complaints.  His pain is completely gone.  Third toe looks a lot better.    Review of Systems: Negative except as noted in the HPI. Denies N/V/F/Ch.  Past Medical History:  Diagnosis Date   Anxiety    BPH with obstruction/lower urinary tract symptoms    Depression    Elevated PSA    Fatty infiltration of liver    GERD (gastroesophageal reflux disease)    Headache    High grade prostatic intraepithelial neoplasia    Hypogonadism in male    NAFLD (nonalcoholic fatty liver disease)    Skin abscess     Current Outpatient Medications:    amoxicillin (AMOXIL) 500 MG capsule, Take 500 mg by mouth 3 (three) times daily., Disp: , Rfl:    ANORO ELLIPTA 62.5-25 MCG/INH AEPB, Inhale 1 puff into the lungs daily., Disp: 180 each, Rfl: 3   atorvastatin (LIPITOR) 20 MG tablet, Take 1 tablet daily, Disp: 90 tablet, Rfl: 3   blood glucose meter kit and supplies, Dispense based on patient and insurance preference. Use up to four times daily as directed. (FOR ICD-10 E10.9, E11.9)., Disp: 1 each, Rfl: 0   chlorhexidine (PERIDEX) 0.12 % solution, SMARTSIG:By Mouth, Disp: , Rfl:    escitalopram (LEXAPRO) 10 MG tablet, Take 1 tablet (10 mg total) by mouth daily., Disp: 90 tablet, Rfl: 3   HYDROcodone-acetaminophen (NORCO) 7.5-325 MG tablet, Take 1 tablet by mouth 3 (three) times daily as needed., Disp: , Rfl:    ibuprofen (ADVIL) 800 MG tablet, Take 800 mg by  mouth every 8 (eight) hours as needed., Disp: , Rfl:    metFORMIN (GLUCOPHAGE XR) 750 MG 24 hr tablet, Take 1 tablet (750 mg total) by mouth daily with breakfast., Disp: 90 tablet, Rfl: 3   NEEDLE, DISP, 18 G (BD DISP NEEDLES) 18G X 1-1/2" MISC, 1 mg by Does not apply route every 14 (fourteen) days., Disp: 50 each, Rfl: 0   NEEDLE, DISP, 21 G (BD DISP NEEDLES) 21G X 1-1/2" MISC, 1 mg by Does not apply route every 14 (fourteen) days., Disp: 50 each, Rfl: 0   NEOMYCIN-POLYMYXIN-HYDROCORTISONE (CORTISPORIN) 1 % SOLN OTIC solution, Apply 1-2 drops to toe BID after soaking, Disp: 10 mL, Rfl: 1   omeprazole (PRILOSEC) 20 MG capsule, Take 1 capsule (20 mg total) by mouth daily before breakfast., Disp: 90 capsule, Rfl: 3   PROAIR HFA 108 (90 Base) MCG/ACT inhaler, TAKE 2 PUFFS BY MOUTH EVERY 6 HOURS AS NEEDED FOR WHEEZE OR SHORTNESS OF BREATH, Disp: 8.5 g, Rfl: 2   Syringe, Disposable, (2-3CC SYRINGE) 3 ML MISC, 1 mg by Does not apply route every 14 (fourteen) days., Disp: 25 each, Rfl: 3   testosterone cypionate (DEPOTESTOSTERONE CYPIONATE) 200 MG/ML injection, Inject 1 mL (200 mg total) into the muscle every 14 (fourteen) days., Disp: 10 mL,  Rfl: 0  Social History   Tobacco Use  Smoking Status Every Day   Packs/day: 1.00   Years: 40.00   Pack years: 40.00   Types: Cigarettes  Smokeless Tobacco Never    Allergies  Allergen Reactions   Augmentin [Amoxicillin-Pot Clavulanate] Hives    Patient had a recent course of Augmentin 01/2020 and tolerated    Bactrim [Sulfamethoxazole-Trimethoprim] Nausea And Vomiting   Objective:  There were no vitals filed for this visit. There is no height or weight on file to calculate BMI. Constitutional Well developed. Well nourished.  Vascular Dorsalis pedis pulses palpable bilaterally. Posterior tibial pulses palpable bilaterally. Capillary refill normal to all digits.  No cyanosis or clubbing noted. Pedal hair growth normal.  Neurologic Normal  speech. Oriented to person, place, and time. Epicritic sensation to light touch grossly present bilaterally.  Dermatologic No further paronychia/redness noted around the right hallux.  No regrowing of the nail noted.  The wound bed is healed.  No further macerated skin tissue noted circumferentially.  No exposure of bone noted.  No purulent drainage noted.  Orthopedic: Normal joint ROM without pain or crepitus bilaterally. No visible deformities. No bony tenderness.   Radiographs: None Assessment:   1. Paronychia of great toe, right   2. Type 2 diabetes mellitus with other specified complication, without long-term current use of insulin ([place])     Plan:  Patient was evaluated and treated and all questions answered.  Right hallux paronychia status post total nail avulsion -Clinically healing well.  Small scab formation noted.  I educated patient that he can stop doing any kind of dressings or take antibiotics.  Eventually the scab may slough off on its own.  At this time if any foot and ankle issues arises come back and see me.  He states understanding.  No follow-ups on file.

## 2021-01-01 ENCOUNTER — Other Ambulatory Visit: Payer: Self-pay | Admitting: Urology

## 2021-01-01 DIAGNOSIS — N138 Other obstructive and reflux uropathy: Secondary | ICD-10-CM

## 2021-01-01 DIAGNOSIS — R972 Elevated prostate specific antigen [PSA]: Secondary | ICD-10-CM

## 2021-01-01 NOTE — Progress Notes (Signed)
Orders in for prostate MRI

## 2021-02-05 ENCOUNTER — Ambulatory Visit: Payer: BLUE CROSS/BLUE SHIELD

## 2021-02-06 ENCOUNTER — Ambulatory Visit: Payer: BLUE CROSS/BLUE SHIELD

## 2021-02-09 ENCOUNTER — Ambulatory Visit
Admission: RE | Admit: 2021-02-09 | Discharge: 2021-02-09 | Disposition: A | Discharge status: Home / Self Care | Payer: BLUE CROSS/BLUE SHIELD | Source: Ambulatory Visit | Attending: Urology | Admitting: Urology

## 2021-02-09 ENCOUNTER — Other Ambulatory Visit: Payer: Self-pay

## 2021-02-09 DIAGNOSIS — R972 Elevated prostate specific antigen [PSA]: Secondary | ICD-10-CM | POA: Diagnosis not present

## 2021-02-09 DIAGNOSIS — N138 Other obstructive and reflux uropathy: Secondary | ICD-10-CM | POA: Insufficient documentation

## 2021-02-09 DIAGNOSIS — N401 Enlarged prostate with lower urinary tract symptoms: Secondary | ICD-10-CM | POA: Insufficient documentation

## 2021-02-09 DIAGNOSIS — N400 Enlarged prostate without lower urinary tract symptoms: Secondary | ICD-10-CM | POA: Diagnosis not present

## 2021-02-09 DIAGNOSIS — R590 Localized enlarged lymph nodes: Secondary | ICD-10-CM | POA: Diagnosis not present

## 2021-02-09 DIAGNOSIS — K4020 Bilateral inguinal hernia, without obstruction or gangrene, not specified as recurrent: Secondary | ICD-10-CM | POA: Diagnosis not present

## 2021-02-09 IMAGING — MR MR PROSTATE WO/W CM
56 series · 56 of 56 positions shown · IV contrast (gadavist)
Comparison: [DATE]

CLINICAL DATA: Elevated PSA and prostatomegaly. Prior negative
biopsy.

EXAM:
MR PROSTATE WITHOUT AND WITH CONTRAST
TECHNIQUE: Multiplanar multisequence MRI images were obtained of the pelvis
centered about the prostate. Pre and post contrast images were
obtained.
CONTRAST:  7.5mL GADAVIST GADOBUTROL 1 MMOL/ML IV SOLN

[Series 3: ax in&out whole · axial · 6.0mm · 0.74mm/px · 1 of 35 slices shown (1 of 2)]
[im 1/35]
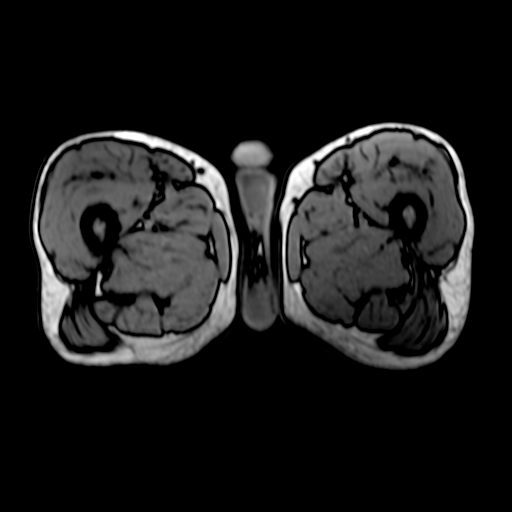

[Series 3: ax in&out whole · axial · 6.0mm · 0.74mm/px · 1 of 35 slices shown (2 of 2)]
[im 1/35]
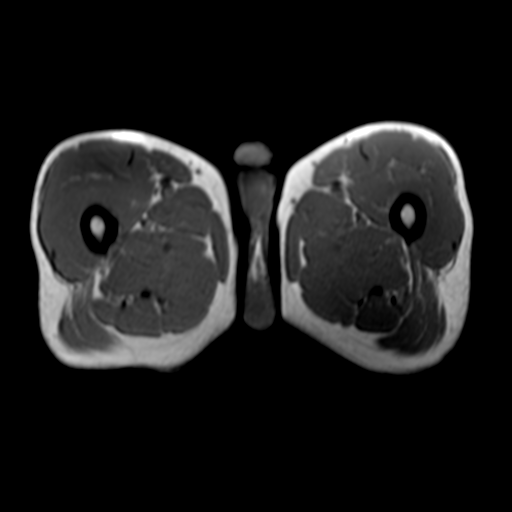

[Series 4: T2 · coronal · 3.0mm · 0.70mm/px · 1 of 35 slices shown (1 of 3)]
[im 1/35]
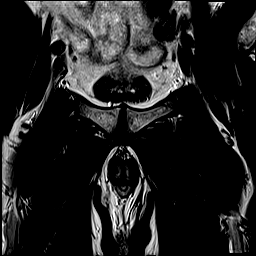

[Series 5: T2 · axial · 3.0mm · 0.56mm/px · 1 of 25 slices shown (2 of 3)]
[im 1/25]
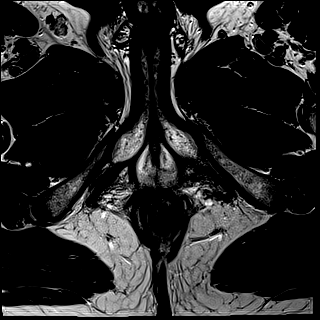

[Series 6: DWI · axial · 3.0mm · 0.86mm/px · 1 of 75 slices shown (1 of 3)]
[im 1/75]
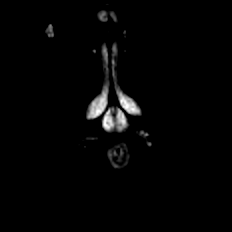

[Series 7: DWI · axial · 3.0mm · 0.86mm/px · 1 of 25 slices shown (2 of 3)]
[im 1/25]
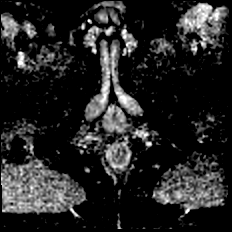

[Series 8: DWI · axial · 3.0mm · 0.86mm/px · 1 of 25 slices shown (3 of 3)]
[im 1/25]
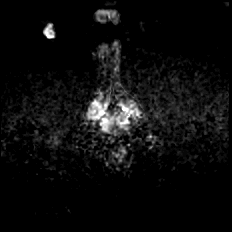

[Series 9: T2 · axial · 1.0mm · 1.04mm/px · 1 of 80 slices shown (3 of 3)]
[im 1/80]
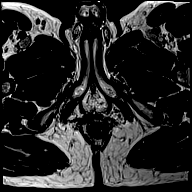

[Series 10: T1 · axial · 3.0mm · 1.15mm/px · 1 of 28 slices shown (1 of 48)]
[im 1/28]
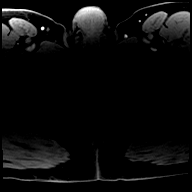

[Series 11: T1 · axial · 3.0mm · 1.15mm/px · 1 of 28 slices shown (2 of 48)]
[im 1/28]
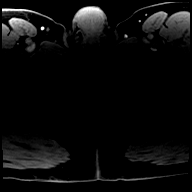

[Series 12: T1 · axial · 3.0mm · 1.15mm/px · 1 of 28 slices shown (3 of 48)]
[im 1/28]
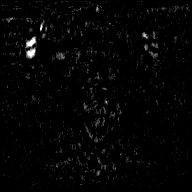

[Series 13: T1 · axial · 3.0mm · 1.15mm/px · 1 of 28 slices shown (4 of 48)]
[im 1/28]
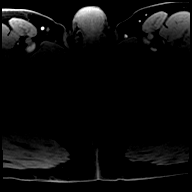

[Series 14: T1 · axial · 3.0mm · 1.15mm/px · 1 of 28 slices shown (5 of 48)]
[im 1/28]
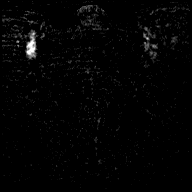

[Series 15: T1 · axial · 3.0mm · 1.15mm/px · 1 of 28 slices shown (6 of 48)]
[im 1/28]
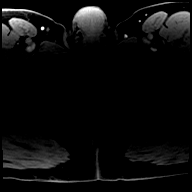

[Series 16: T1 · axial · 3.0mm · 1.15mm/px · 1 of 28 slices shown (7 of 48)]
[im 1/28]
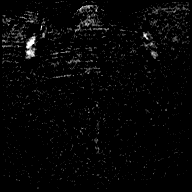

[Series 17: T1 · axial · 3.0mm · 1.15mm/px · 1 of 28 slices shown (8 of 48)]
[im 1/28]
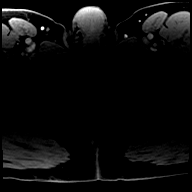

[Series 18: T1 · axial · 3.0mm · 1.15mm/px · 1 of 28 slices shown (9 of 48)]
[im 1/28]
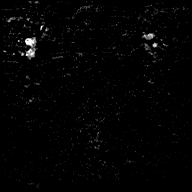

[Series 19: T1 · axial · 3.0mm · 1.15mm/px · 1 of 28 slices shown (10 of 48)]
[im 1/28]
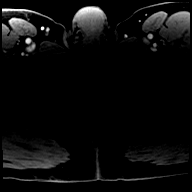

[Series 20: T1 · axial · 3.0mm · 1.15mm/px · 1 of 28 slices shown (11 of 48)]
[im 1/28]
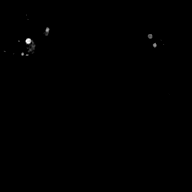

[Series 21: T1 · axial · 3.0mm · 1.15mm/px · 1 of 28 slices shown (12 of 48)]
[im 1/28]
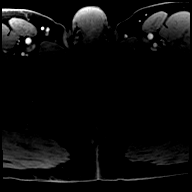

[Series 22: T1 · axial · 3.0mm · 1.15mm/px · 1 of 28 slices shown (13 of 48)]
[im 1/28]
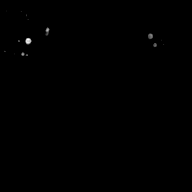

[Series 23: T1 · axial · 3.0mm · 1.15mm/px · 1 of 28 slices shown (14 of 48)]
[im 1/28]
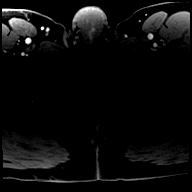

[Series 24: T1 · axial · 3.0mm · 1.15mm/px · 1 of 28 slices shown (15 of 48)]
[im 1/28]
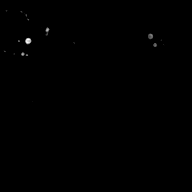

[Series 25: T1 · axial · 3.0mm · 1.15mm/px · 1 of 28 slices shown (16 of 48)]
[im 1/28]
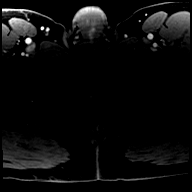

[Series 26: T1 · axial · 3.0mm · 1.15mm/px · 1 of 28 slices shown (17 of 48)]
[im 1/28]
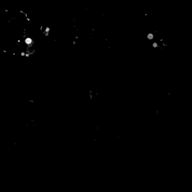

[Series 27: T1 · axial · 3.0mm · 1.15mm/px · 1 of 28 slices shown (18 of 48)]
[im 1/28]
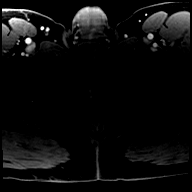

[Series 28: T1 · axial · 3.0mm · 1.15mm/px · 1 of 28 slices shown (19 of 48)]
[im 1/28]
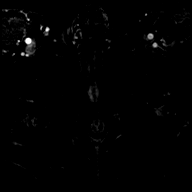

[Series 29: T1 · axial · 3.0mm · 1.15mm/px · 1 of 28 slices shown (20 of 48)]
[im 1/28]
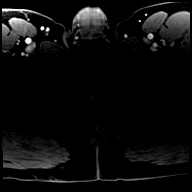

[Series 30: T1 · axial · 3.0mm · 1.15mm/px · 1 of 28 slices shown (21 of 48)]
[im 1/28]
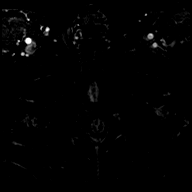

[Series 31: T1 · axial · 3.0mm · 1.15mm/px · 1 of 28 slices shown (22 of 48)]
[im 1/28]
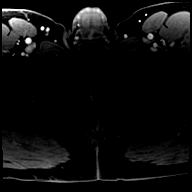

[Series 32: T1 · axial · 3.0mm · 1.15mm/px · 1 of 28 slices shown (23 of 48)]
[im 1/28]
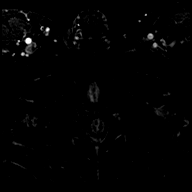

[Series 33: T1 · axial · 3.0mm · 1.15mm/px · 1 of 28 slices shown (24 of 48)]
[im 1/28]
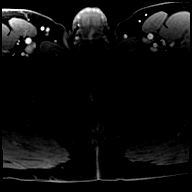

[Series 34: T1 · axial · 3.0mm · 1.15mm/px · 1 of 28 slices shown (25 of 48)]
[im 1/28]
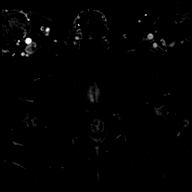

[Series 35: T1 · axial · 3.0mm · 1.15mm/px · 1 of 28 slices shown (26 of 48)]
[im 1/28]
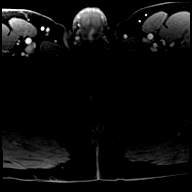

[Series 36: T1 · axial · 3.0mm · 1.15mm/px · 1 of 28 slices shown (27 of 48)]
[im 1/28]
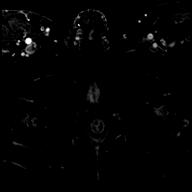

[Series 37: T1 · axial · 3.0mm · 1.15mm/px · 1 of 28 slices shown (28 of 48)]
[im 1/28]
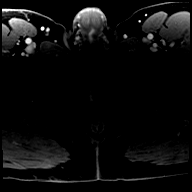

[Series 38: T1 · axial · 3.0mm · 1.15mm/px · 1 of 28 slices shown (29 of 48)]
[im 1/28]
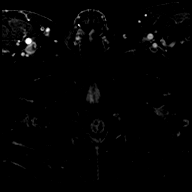

[Series 39: T1 · axial · 3.0mm · 1.15mm/px · 1 of 28 slices shown (30 of 48)]
[im 1/28]
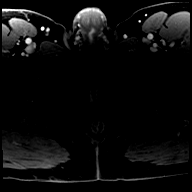

[Series 40: T1 · axial · 3.0mm · 1.15mm/px · 1 of 28 slices shown (31 of 48)]
[im 1/28]
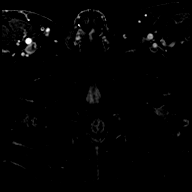

[Series 41: T1 · axial · 3.0mm · 1.15mm/px · 1 of 28 slices shown (32 of 48)]
[im 1/28]
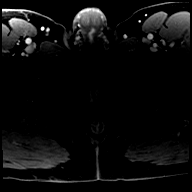

[Series 42: T1 · axial · 3.0mm · 1.15mm/px · 1 of 28 slices shown (33 of 48)]
[im 1/28]
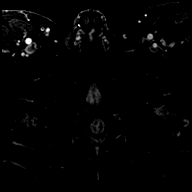

[Series 43: T1 · axial · 3.0mm · 1.15mm/px · 1 of 28 slices shown (34 of 48)]
[im 1/28]
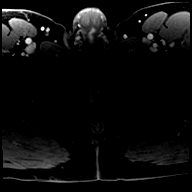

[Series 44: T1 · axial · 3.0mm · 1.15mm/px · 1 of 28 slices shown (35 of 48)]
[im 1/28]
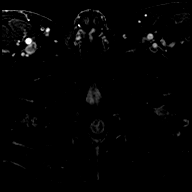

[Series 45: T1 · axial · 3.0mm · 1.15mm/px · 1 of 28 slices shown (36 of 48)]
[im 1/28]
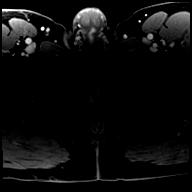

[Series 46: T1 · axial · 3.0mm · 1.15mm/px · 1 of 28 slices shown (37 of 48)]
[im 1/28]
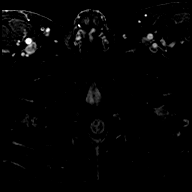

[Series 47: T1 · axial · 3.0mm · 1.15mm/px · 1 of 28 slices shown (38 of 48)]
[im 1/28]
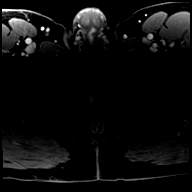

[Series 48: T1 · axial · 3.0mm · 1.15mm/px · 1 of 28 slices shown (39 of 48)]
[im 1/28]
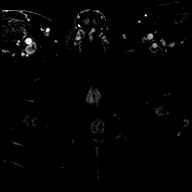

[Series 49: T1 · axial · 3.0mm · 1.15mm/px · 1 of 28 slices shown (40 of 48)]
[im 1/28]
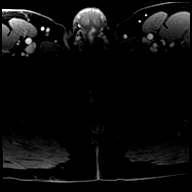

[Series 50: T1 · axial · 3.0mm · 1.15mm/px · 1 of 28 slices shown (41 of 48)]
[im 1/28]
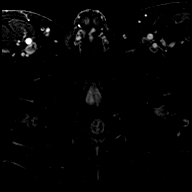

[Series 51: T1 · axial · 3.0mm · 1.15mm/px · 1 of 28 slices shown (42 of 48)]
[im 1/28]
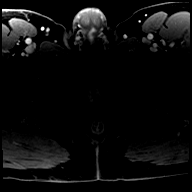

[Series 52: T1 · axial · 3.0mm · 1.15mm/px · 1 of 28 slices shown (43 of 48)]
[im 1/28]
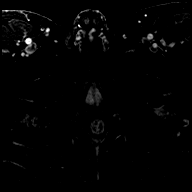

[Series 53: T1 · axial · 3.0mm · 1.15mm/px · 1 of 28 slices shown (44 of 48)]
[im 1/28]
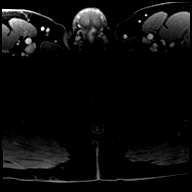

[Series 54: T1 · axial · 3.0mm · 1.15mm/px · 1 of 28 slices shown (45 of 48)]
[im 1/28]
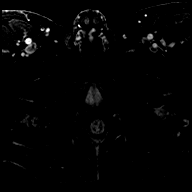

[Series 55: T1 · axial · 3.0mm · 1.15mm/px · 1 of 28 slices shown (46 of 48)]
[im 1/28]
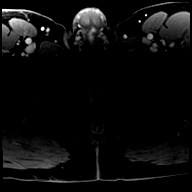

[Series 56: T1 · axial · 3.0mm · 1.15mm/px · 1 of 28 slices shown (47 of 48)]
[im 1/28]
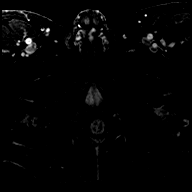

[Series 57: T1 · axial · 3.0mm · 1.15mm/px · 1 of 28 slices shown (48 of 48)]
[im 1/28]
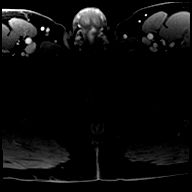

[56 of 56 positions shown; findings below may reference images not displayed]

FINDINGS: Prostate: No suspicious finding within the central gland. No areas
of masslike T2 hypointensity, restricted diffusion, or early
post-contrast enhancement within the peripheral zone.

Volume: 4.5 x 3.3 x 2.5 cm (volume = 19 cm^3)

Transcapsular spread:  Absent

Seminal vesicle involvement: Absent

Neurovascular bundle involvement: Absent

Pelvic adenopathy: Absent

Bone metastasis: Absent

Other findings: No significant free fluid. Normal pelvic bowel
loops. Normal urinary bladder. Tiny bilateral fat containing
inguinal hernias.
IMPRESSION: No findings of prostate carcinoma.

## 2021-02-09 MED ORDER — GADOBUTROL 1 MMOL/ML IV SOLN
7.5000 mL | Freq: Once | INTRAVENOUS | Status: AC | PRN
  Administered 2021-02-09: 7.5 mL via INTRAVENOUS

## 2021-02-23 ENCOUNTER — Other Ambulatory Visit: Payer: Self-pay | Admitting: Urology

## 2021-02-24 ENCOUNTER — Telehealth: Payer: Self-pay | Admitting: Urology

## 2021-02-24 NOTE — Telephone Encounter (Signed)
Brian Wolfe's PSA has not been drawn since July, I would like it drawn again prior to refilling his testosterone.

## 2021-02-25 NOTE — Telephone Encounter (Signed)
Patient's wife notified and appointment for labs have been scheduled.

## 2021-02-27 ENCOUNTER — Other Ambulatory Visit: Payer: Self-pay | Admitting: Urology

## 2021-02-27 DIAGNOSIS — E349 Endocrine disorder, unspecified: Secondary | ICD-10-CM

## 2021-02-27 MED ORDER — TESTOSTERONE CYPIONATE 200 MG/ML IM SOLN: INTRAMUSCULAR | 0 refills | Status: DC

## 2021-03-06 ENCOUNTER — Encounter: Payer: BLUE CROSS/BLUE SHIELD | Admitting: Family Medicine

## 2021-03-09 ENCOUNTER — Telehealth: Payer: Self-pay

## 2021-03-09 NOTE — Telephone Encounter (Signed)
Brian Wolfe from [employer] coverage called requesting clinical information for authorization of testosterone injection approval.  Question: Does the patient currently have an elevated PSA? Answer given; patient had a rise in PSA on 11/27/20 since drawn again on 12/29/20 PSA went back down to base line 4.5. Verbalization given and was sent to review. A fax notification will be sent with authorization status

## 2021-03-11 ENCOUNTER — Other Ambulatory Visit: Payer: Self-pay

## 2021-03-11 ENCOUNTER — Other Ambulatory Visit: Payer: BLUE CROSS/BLUE SHIELD

## 2021-03-11 DIAGNOSIS — R972 Elevated prostate specific antigen [PSA]: Secondary | ICD-10-CM

## 2021-03-11 DIAGNOSIS — E349 Endocrine disorder, unspecified: Secondary | ICD-10-CM

## 2021-03-11 NOTE — Addendum Note (Signed)
Addended by: Despina Hidden on: 03/11/2021 09:18 AM   Modules accepted: Orders

## 2021-03-12 ENCOUNTER — Other Ambulatory Visit: Payer: BLUE CROSS/BLUE SHIELD

## 2021-03-12 DIAGNOSIS — E349 Endocrine disorder, unspecified: Secondary | ICD-10-CM

## 2021-03-12 LAB — TESTOSTERONE: Testosterone: 272 ng/dL (ref 264–916)

## 2021-03-12 LAB — PSA: Prostate Specific Ag, Serum: 4.3 ng/mL — ABNORMAL HIGH (ref 0.0–4.0)

## 2021-03-12 NOTE — Telephone Encounter (Signed)
Ok to change rx?

## 2021-03-13 ENCOUNTER — Other Ambulatory Visit: Payer: Self-pay

## 2021-03-13 ENCOUNTER — Other Ambulatory Visit: Payer: Self-pay | Admitting: Family Medicine

## 2021-03-13 ENCOUNTER — Other Ambulatory Visit: Payer: Self-pay | Admitting: Urology

## 2021-03-13 ENCOUNTER — Ambulatory Visit (INDEPENDENT_AMBULATORY_CARE_PROVIDER_SITE_OTHER): Payer: BLUE CROSS/BLUE SHIELD | Admitting: Family Medicine

## 2021-03-13 ENCOUNTER — Encounter: Payer: Self-pay | Admitting: Family Medicine

## 2021-03-13 VITALS — BP 129/72 | HR 65 | Ht 65.0 in | Wt 162.4 lb

## 2021-03-13 DIAGNOSIS — J410 Simple chronic bronchitis: Secondary | ICD-10-CM | POA: Diagnosis not present

## 2021-03-13 DIAGNOSIS — Z0000 Encounter for general adult medical examination without abnormal findings: Secondary | ICD-10-CM

## 2021-03-13 DIAGNOSIS — E785 Hyperlipidemia, unspecified: Secondary | ICD-10-CM | POA: Diagnosis not present

## 2021-03-13 DIAGNOSIS — E1169 Type 2 diabetes mellitus with other specified complication: Secondary | ICD-10-CM

## 2021-03-13 DIAGNOSIS — E349 Endocrine disorder, unspecified: Secondary | ICD-10-CM

## 2021-03-13 DIAGNOSIS — Z720 Tobacco use: Secondary | ICD-10-CM | POA: Diagnosis not present

## 2021-03-13 DIAGNOSIS — G4726 Circadian rhythm sleep disorder, shift work type: Secondary | ICD-10-CM

## 2021-03-13 LAB — POCT UA - MICROALBUMIN: Microalbumin Ur, POC: 50 mg/L

## 2021-03-13 MED ORDER — ALBUTEROL SULFATE HFA 108 (90 BASE) MCG/ACT IN AERS: INHALATION_SPRAY | RESPIRATORY_TRACT | 2 refills | Status: DC

## 2021-03-13 MED ORDER — ANORO ELLIPTA 62.5-25 MCG/INH IN AEPB: 1.0000 | INHALATION_SPRAY | Freq: Every day | RESPIRATORY_TRACT | 3 refills | Status: DC

## 2021-03-13 MED ORDER — TESTOSTERONE CYPIONATE 200 MG/ML IM SOLN: INTRAMUSCULAR | 0 refills | Status: DC

## 2021-03-13 NOTE — Patient Instructions (Addendum)
Thank you for coming to the office today.  Labs today.  Lung Cancer Screening CT Scan at [facility] - stay tuned they will call you.   [place] Quitline for smoking  [phone]  Refilled inhalers  Next Colonoscopy 2024. [facility]   Please schedule a Follow-up Appointment to: Return in about 9 months (around 12/03/2021) for follow up in June 2023 for DM A1c.  If you have any other questions or concerns, please feel free to call the office or send a message through [place]. You may also schedule an earlier appointment if necessary.  Additionally, you may be receiving a survey about your experience at our office within a few days to 1 week by e-mail or mail. We value your feedback.  [name], DO [place]
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
## 2021-03-14 LAB — CBC WITH DIFFERENTIAL/PLATELET
Absolute Monocytes: 470 cells/uL (ref 200–950)
Basophils Absolute: 89 cells/uL (ref 0–200)
Basophils Relative: 1.1 %
Eosinophils Absolute: 381 cells/uL (ref 15–500)
Eosinophils Relative: 4.7 %
HCT: 43.4 % (ref 38.5–50.0)
Hemoglobin: 14.9 g/dL (ref 13.2–17.1)
Lymphs Abs: 2705 cells/uL (ref 850–3900)
MCH: 31.8 pg (ref 27.0–33.0)
MCHC: 34.3 g/dL (ref 32.0–36.0)
MCV: 92.7 fL (ref 80.0–100.0)
MPV: 11.9 fL (ref 7.5–12.5)
Monocytes Relative: 5.8 %
Neutro Abs: 4455 cells/uL (ref 1500–7800)
Neutrophils Relative %: 55 %
Platelets: 199 10*3/uL (ref 140–400)
RBC: 4.68 10*6/uL (ref 4.20–5.80)
RDW: 12.6 % (ref 11.0–15.0)
Total Lymphocyte: 33.4 %
WBC: 8.1 10*3/uL (ref 3.8–10.8)

## 2021-03-14 LAB — COMPLETE METABOLIC PANEL WITH GFR
AG Ratio: 1.9 (calc) (ref 1.0–2.5)
ALT: 34 U/L (ref 9–46)
AST: 23 U/L (ref 10–35)
Albumin: 4.9 g/dL (ref 3.6–5.1)
Alkaline phosphatase (APISO): 73 U/L (ref 35–144)
BUN: 18 mg/dL (ref 7–25)
CO2: 27 mmol/L (ref 20–32)
Calcium: 9.9 mg/dL (ref 8.6–10.3)
Chloride: 100 mmol/L (ref 98–110)
Creat: 0.88 mg/dL (ref 0.70–1.30)
Globulin: 2.6 g/dL (calc) (ref 1.9–3.7)
Glucose, Bld: 166 mg/dL — ABNORMAL HIGH (ref 65–139)
Potassium: 3.8 mmol/L (ref 3.5–5.3)
Sodium: 135 mmol/L (ref 135–146)
Total Bilirubin: 0.8 mg/dL (ref 0.2–1.2)
Total Protein: 7.5 g/dL (ref 6.1–8.1)
eGFR: 99 mL/min/{1.73_m2} (ref >=60)

## 2021-03-14 LAB — LIPID PANEL
Cholesterol: 146 mg/dL (ref <200)
HDL: 46 mg/dL (ref >=40)
LDL Cholesterol (Calc): 80 mg/dL (calc)
Non-HDL Cholesterol (Calc): 100 mg/dL (calc) (ref <130)
Total CHOL/HDL Ratio: 3.2 (calc) (ref <5.0)
Triglycerides: 104 mg/dL (ref <150)

## 2021-03-14 LAB — HEMOGLOBIN A1C
Hgb A1c MFr Bld: 7.1 % of total Hgb — ABNORMAL HIGH (ref <5.7)
Mean Plasma Glucose: 157 mg/dL
eAG (mmol/L): 8.7 mmol/L

## 2021-03-15 NOTE — Assessment & Plan Note (Signed)
Controlled cholesterol on statin lifestyle Last lipid panel 2021  Plan: 1. Continue current meds - Atorvastatin 20mg   2. Encourage improved lifestyle - low carb/cholesterol, reduce portion size, continue improving regular exercise

## 2021-03-15 NOTE — Assessment & Plan Note (Signed)
Chronic problem Secondary to smoking Continue Anoro

## 2021-03-15 NOTE — Assessment & Plan Note (Signed)
Well-controlled DM with N2D 7.1 Complications - hyperlipidemia, GERD  Plan:  1. Continue current therapy - Metformin XR 750mg  daily 2. Encourage improved lifestyle - low carb, low sugar diet, reduce portion size, continue improving regular exercise 3. Check CBG , bring log to next visit for review 4. Continue Statin

## 2021-03-23 ENCOUNTER — Encounter: Payer: Self-pay | Admitting: Family Medicine

## 2021-04-30 ENCOUNTER — Other Ambulatory Visit: Payer: Self-pay

## 2021-04-30 ENCOUNTER — Encounter: Payer: Self-pay | Admitting: Family Medicine

## 2021-04-30 DIAGNOSIS — F17210 Nicotine dependence, cigarettes, uncomplicated: Secondary | ICD-10-CM

## 2021-05-08 ENCOUNTER — Encounter: Payer: Self-pay | Admitting: Family Medicine

## 2021-05-24 ENCOUNTER — Other Ambulatory Visit: Payer: Self-pay | Admitting: Family Medicine

## 2021-05-24 DIAGNOSIS — E1169 Type 2 diabetes mellitus with other specified complication: Secondary | ICD-10-CM

## 2021-05-24 NOTE — Telephone Encounter (Signed)
last RF 08/20/20 #90 3 RF too soon  Requested Prescriptions  Refused Prescriptions Disp Refills  . metFORMIN (GLUCOPHAGE-XR) 750 MG 24 hr tablet [Pharmacy Med Name: METFORMIN HCL ER 750 MG TABLET] 90 tablet 3    Sig: TAKE 1 TABLET BY MOUTH EVERY DAY WITH BREAKFAST     Endocrinology:  Diabetes - Biguanides Passed - 05/24/2021  9:17 AM      Passed - Cr in normal range and within 360 days    Creat  Date Value Ref Range Status  03/13/2021 0.88 0.70 - 1.30 mg/dL Final         Passed - HBA1C is between 0 and 7.9 and within 180 days    Hgb A1c MFr Bld  Date Value Ref Range Status  03/13/2021 7.1 (H) <5.7 % of total Hgb Final    Comment:    For someone without known diabetes, a hemoglobin A1c value of 6.5% or greater indicates that they may have  diabetes and this should be confirmed with a follow-up  test. . For someone with known diabetes, a value <7% indicates  that their diabetes is well controlled and a value  greater than or equal to 7% indicates suboptimal  control. A1c targets should be individualized based on  duration of diabetes, age, comorbid conditions, and  other considerations. . Currently, no consensus exists regarding use of hemoglobin A1c for diagnosis of diabetes for children. .          Passed - eGFR in normal range and within 360 days    GFR, Est African American  Date Value Ref Range Status  04/10/2020 103 > OR = 60 mL/min/1.34m Final   GFR, Est Non African American  Date Value Ref Range Status  04/10/2020 89 > OR = 60 mL/min/1.73mFinal   eGFR  Date Value Ref Range Status  03/13/2021 99 > OR = 60 mL/min/1.7352minal    Comment:    The eGFR is based on the CKD-EPI 2021 equation. To calculate  the new eGFR from a previous Creatinine or Cystatin C result, go to https://www.kidney.org/professionals/ kdoqi/gfr%5Fcalculator          Passed - Valid encounter within last 6 months    Recent Outpatient Visits          2 months ago Annual physical exam    [facility]   9 months ago Type 2 diabetes mellitus with other specified complication, without long-term current use of insulin ([facility]   1 year ago Type 2 diabetes mellitus without complication, without long-term current use of insulin ([facility]   1 year ago [facility] exacerb[facility]   1 year ago Type 2 diabetes mellitus without complication, without long-term current use of insulin ([facility]      Future Appointments            In 2 weeks GroElie ConferrLars MassonP [facility]   In 6 months KarPar[facility] RangerleDevonne DoughtyO [facility]

## 2021-05-29 ENCOUNTER — Other Ambulatory Visit: Payer: Self-pay | Admitting: *Deleted

## 2021-05-29 DIAGNOSIS — N529 Male erectile dysfunction, unspecified: Secondary | ICD-10-CM

## 2021-05-29 DIAGNOSIS — E349 Endocrine disorder, unspecified: Secondary | ICD-10-CM

## 2021-05-29 DIAGNOSIS — R972 Elevated prostate specific antigen [PSA]: Secondary | ICD-10-CM

## 2021-05-30 ENCOUNTER — Other Ambulatory Visit: Payer: Self-pay | Admitting: Family Medicine

## 2021-05-30 DIAGNOSIS — E1169 Type 2 diabetes mellitus with other specified complication: Secondary | ICD-10-CM

## 2021-05-30 NOTE — Telephone Encounter (Signed)
Called pharmacy 76 tablets remaining. Insurance will have pt pay same amount as if he got a 90 day refill. Pt got a partial refill at some point.  Should have had enough to last until 3/23.  Gave a phone in order for #90 tablets and no refills to [facility] Pharmacist. Requested Prescriptions  Pending Prescriptions Disp Refills   metFORMIN (GLUCOPHAGE-XR) 750 MG 24 hr tablet [Pharmacy Med Name: METFORMIN HCL ER 750 MG TABLET] 90 tablet 0    Sig: TAKE 1 TABLET BY [place]     Endocrinology:  Diabetes - Biguanides Passed - 05/30/2021 10:52 AM      Passed - Cr in normal range and within 360 days    Creat  Date Value Ref Range Status  03/13/2021 0.88 0.70 - 1.30 mg/dL Final          Passed - HBA1C is between 0 and 7.9 and within 180 days    Hgb A1c MFr Bld  Date Value Ref Range Status  03/13/2021 7.1 (H) <5.7 % of total Hgb Final    Comment:    For someone without known diabetes, a hemoglobin A1c value of 6.5% or greater indicates that they may have  diabetes and this should be confirmed with a follow-up  test. . For someone with known diabetes, a value <7% indicates  that their diabetes is well controlled and a value  greater than or equal to 7% indicates suboptimal  control. A1c targets should be individualized based on  duration of diabetes, age, comorbid conditions, and  other considerations. . Currently, no consensus exists regarding use of hemoglobin A1c for diagnosis of diabetes for children. .           Passed - eGFR in normal range and within 360 days    GFR, Est African American  Date Value Ref Range Status  04/10/2020 103 > OR = 60 mL/min/1.54m Final   GFR, Est Non African American  Date Value Ref Range Status  04/10/2020 89 > OR = 60 mL/min/1.733mFinal   eGFR  Date Value Ref Range Status  03/13/2021 99 > OR = 60 mL/min/1.7355minal    Comment:    The eGFR is based on the CKD-EPI 2021 equation. To calculate  the new eGFR from a  previous Creatinine or Cystatin C result, go to https://www.kidney.org/professionals/ kdoqi/gfr%5Fcalculator           Passed - Valid encounter within last 6 months    Recent Outpatient Visits           2 months ago [facility]   9 months ago Type 2 diabetes mellitus with other specified complication, without long-term current use of insulin ([facility]   1 year ago Type 2 diabetes mellitus without complication, without long-term current use of insulin [facility]   1 year ago [facility] exacerbation [facility]   1 year ago Type 2 diabetes mellitus without complication, without long-term current use of insulin ([facility]       Future Appointments             In 1 week [facility]   In 6 months [facility]leDevonne Dought[facility] [place]

## 2021-06-09 ENCOUNTER — Other Ambulatory Visit: Payer: Self-pay

## 2021-06-09 ENCOUNTER — Other Ambulatory Visit: Payer: Self-pay | Admitting: Urology

## 2021-06-09 ENCOUNTER — Other Ambulatory Visit: Payer: BLUE CROSS/BLUE SHIELD

## 2021-06-09 DIAGNOSIS — R972 Elevated prostate specific antigen [PSA]: Secondary | ICD-10-CM

## 2021-06-09 DIAGNOSIS — E349 Endocrine disorder, unspecified: Secondary | ICD-10-CM

## 2021-06-09 DIAGNOSIS — N529 Male erectile dysfunction, unspecified: Secondary | ICD-10-CM | POA: Diagnosis not present

## 2021-06-10 ENCOUNTER — Ambulatory Visit
Admission: RE | Admit: 2021-06-10 | Discharge: 2021-06-10 | Disposition: A | Discharge status: Home / Self Care | Payer: BLUE CROSS/BLUE SHIELD | Source: Ambulatory Visit | Attending: Acute Care | Admitting: Acute Care

## 2021-06-10 ENCOUNTER — Other Ambulatory Visit: Payer: Self-pay

## 2021-06-10 ENCOUNTER — Encounter: Payer: Self-pay | Admitting: Acute Care

## 2021-06-10 ENCOUNTER — Telehealth (INDEPENDENT_AMBULATORY_CARE_PROVIDER_SITE_OTHER): Payer: BLUE CROSS/BLUE SHIELD | Admitting: Acute Care

## 2021-06-10 DIAGNOSIS — F17210 Nicotine dependence, cigarettes, uncomplicated: Secondary | ICD-10-CM

## 2021-06-10 DIAGNOSIS — Z87891 Personal history of nicotine dependence: Secondary | ICD-10-CM | POA: Diagnosis not present

## 2021-06-10 LAB — HEMOGLOBIN AND HEMATOCRIT, BLOOD
Hematocrit: 42.7 % (ref 37.5–51.0)
Hemoglobin: 14.4 g/dL (ref 13.0–17.7)

## 2021-06-10 LAB — PSA: Prostate Specific Ag, Serum: 4.7 ng/mL — ABNORMAL HIGH (ref 0.0–4.0)

## 2021-06-10 LAB — TESTOSTERONE: Testosterone: 362 ng/dL (ref 264–916)

## 2021-06-10 IMAGING — CT CT CHEST LUNG CANCER SCREENING LOW DOSE W/O CM
2 of 5 series · 15 of 40 positions shown, 18 images · non-contrast
Comparison: Standard CT chest [DATE]

CLINICAL DATA: 60-year-old male with 77 pack-year history of
smoking. Lung cancer screening.

EXAM:
CT CHEST WITHOUT CONTRAST LOW-DOSE FOR LUNG CANCER SCREENING
TECHNIQUE: Multidetector CT imaging of the chest was performed following the
standard protocol without IV contrast.

[Series 3: lung 1.00 · axial · 0.72mm/px · z∈[-1183,-890]mm · 12 of 325 slices shown, 15 images]
[im 16/325  mediastinal]
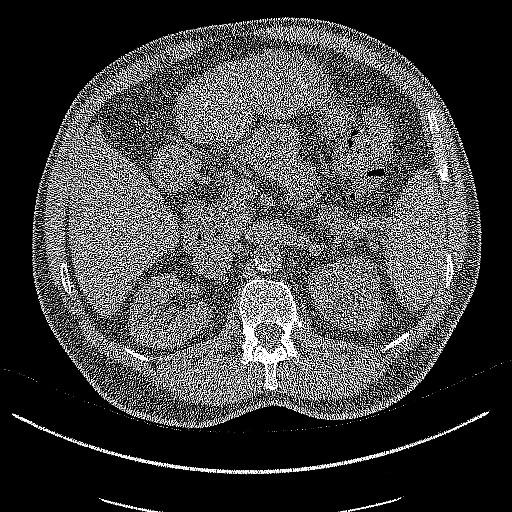
[im 16/325  lung]
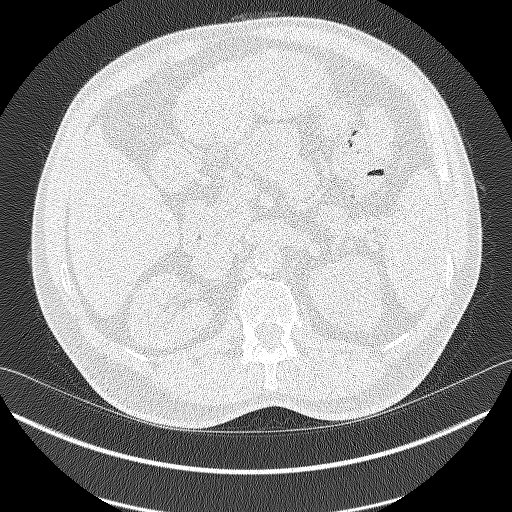
[im 47/325  lung]
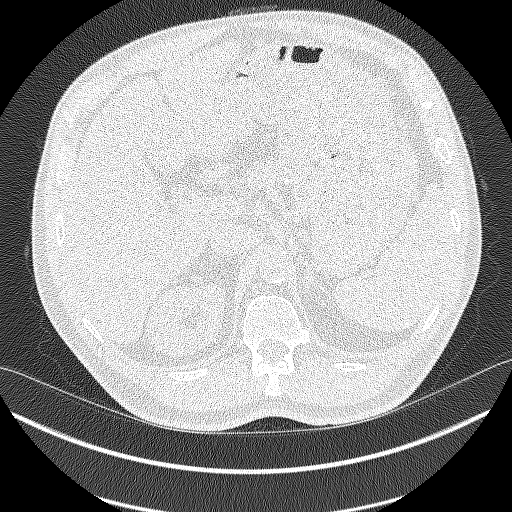
[im 78/325  lung]
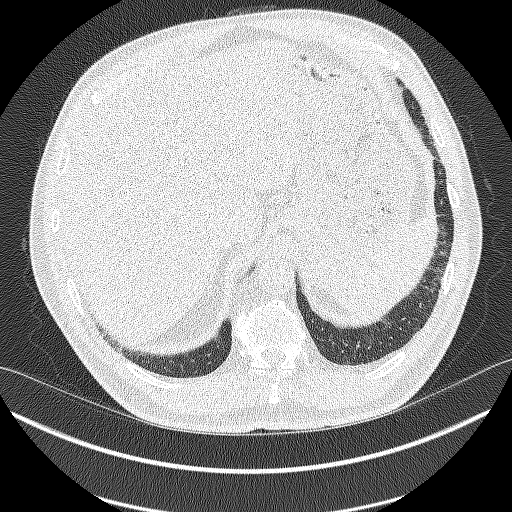
[im 93/325  lung]
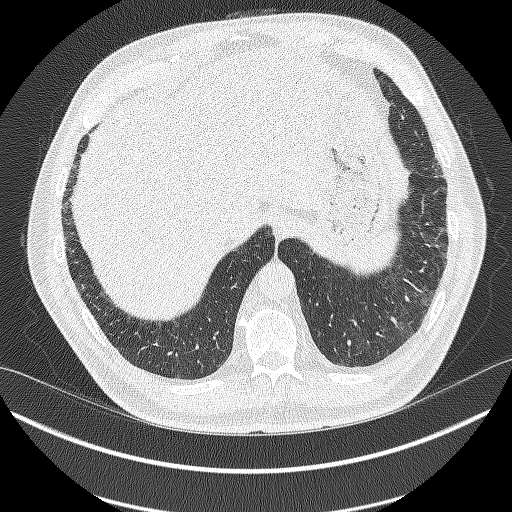
[im 124/325  mediastinal]
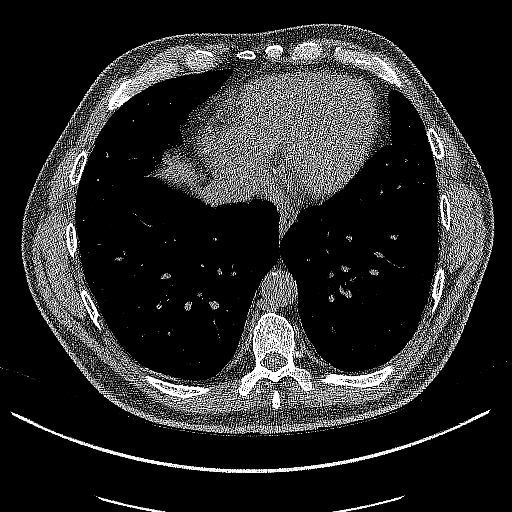
[im 124/325  lung]
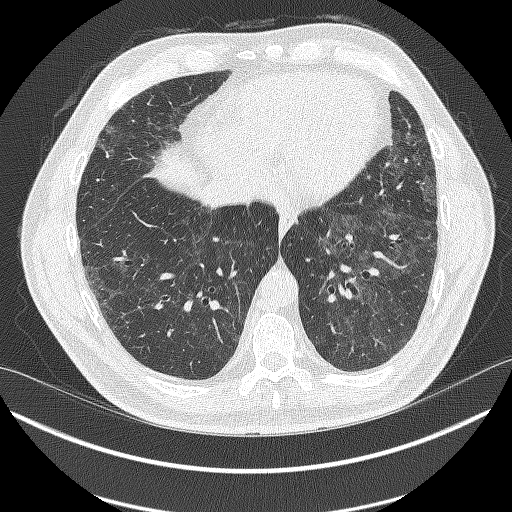
[im 155/325  lung]
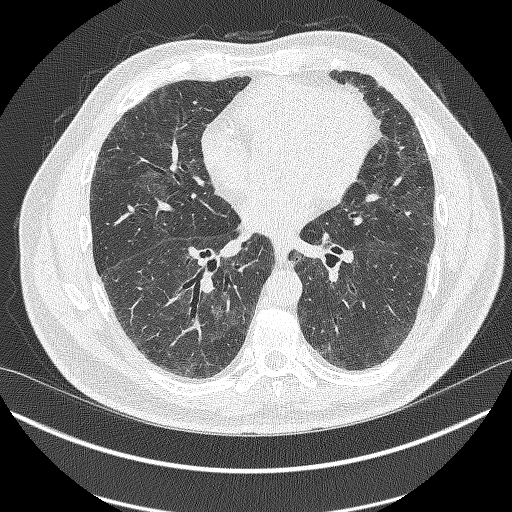
[im 170/325  lung]
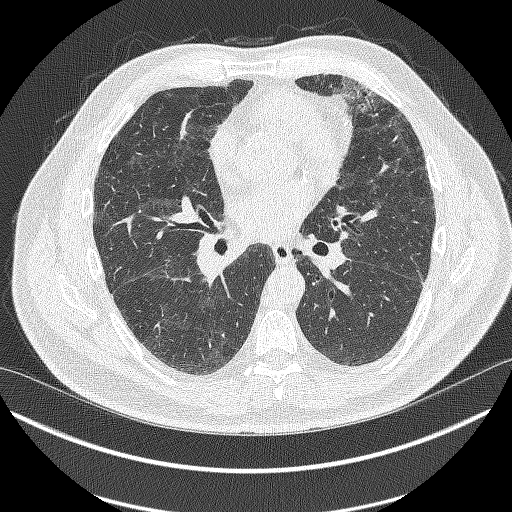
[im 201/325  lung]
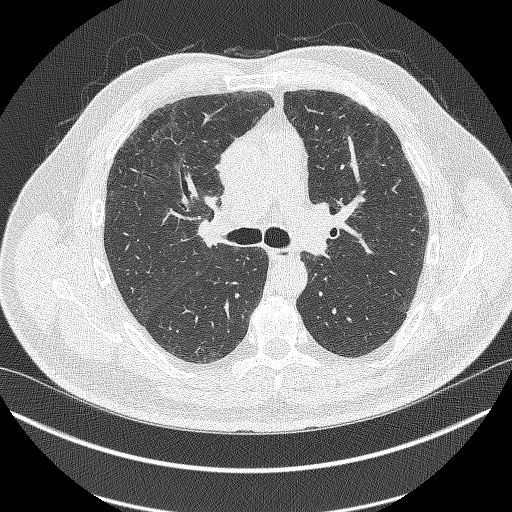
[im 232/325  mediastinal]
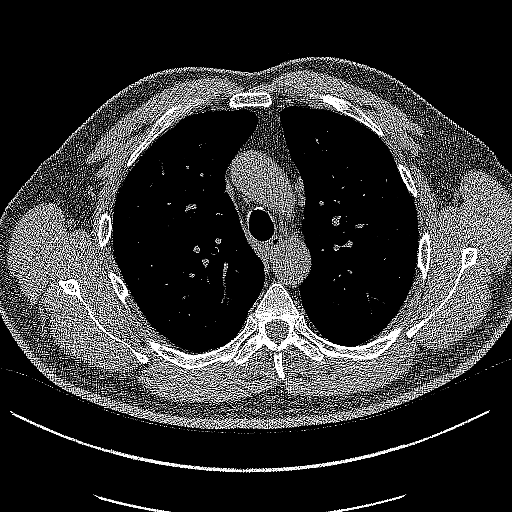
[im 232/325  lung]
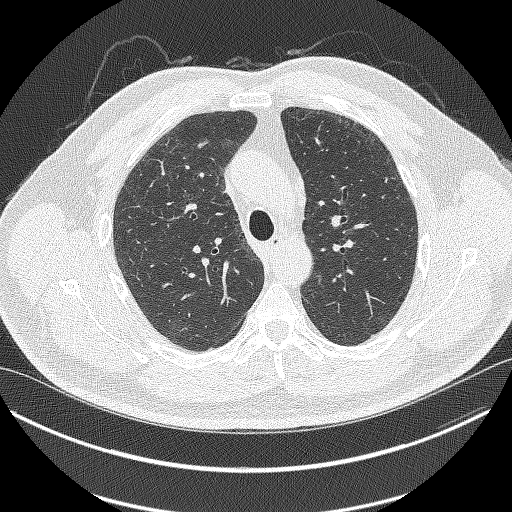
[im 247/325  lung]
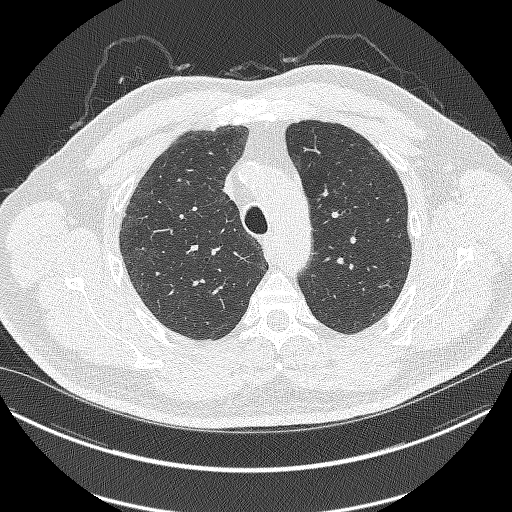
[im 278/325  lung]
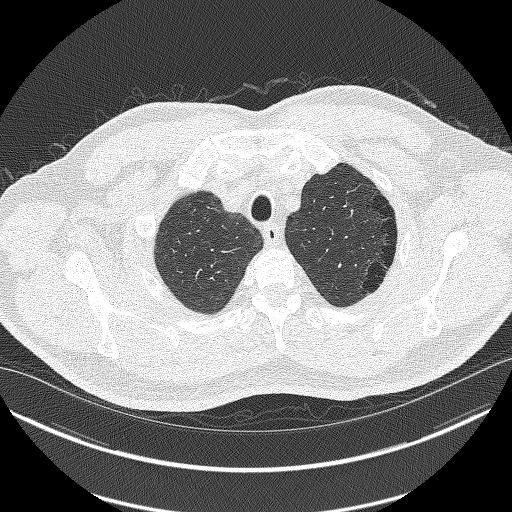
[im 309/325  lung]
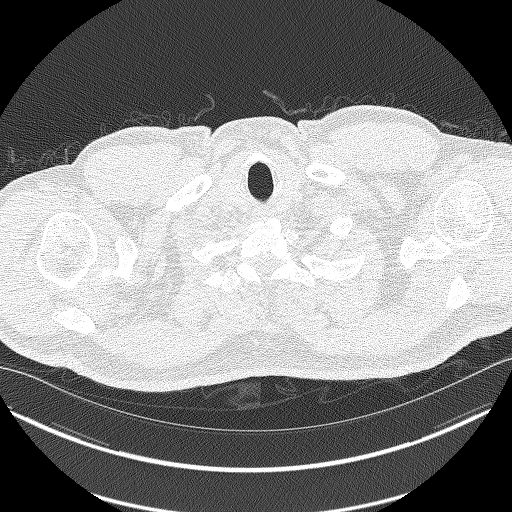

[Series 5: coronals lung 1.00 cor · coronal · 0.64mm/px · 3 of 370 slices shown]
[im 74/370  lung]
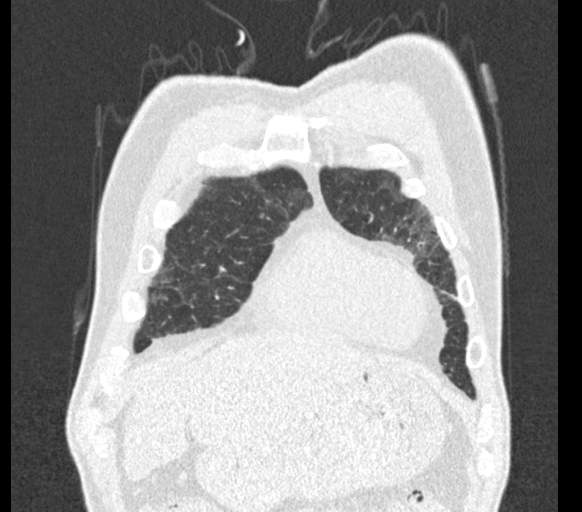
[im 148/370  lung]
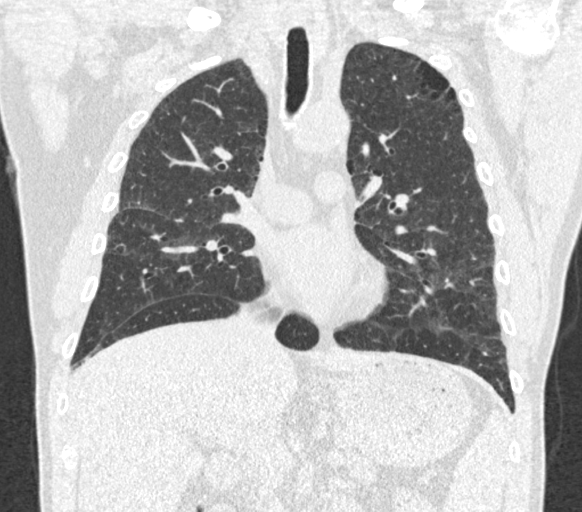
[im 222/370  lung]
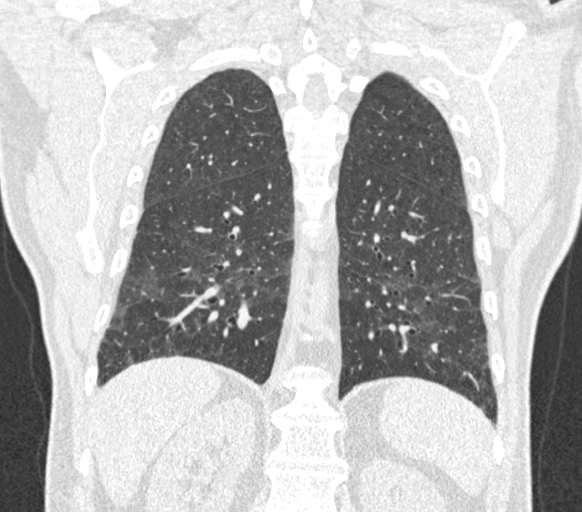

[15 of 40 positions shown; findings below may reference images not displayed]

FINDINGS: Cardiovascular: The heart size is normal. No substantial pericardial
effusion. Coronary artery calcification is evident. Mild
atherosclerotic calcification is noted in the wall of the thoracic
aorta.

Mediastinum/Nodes: No mediastinal lymphadenopathy. No evidence for
gross hilar lymphadenopathy although assessment is limited by the
lack of intravenous contrast on the current study. The esophagus has
normal imaging features. There is no axillary lymphadenopathy.

Lungs/Pleura: Centrilobular and paraseptal emphysema evident.
Scattered bilateral pulmonary nodules are evident, including
dominant 6.7 mm nodule in the peripheral left upper lobe (image
180). No focal airspace consolidation. No pleural effusion.

Upper Abdomen: Unremarkable.

Musculoskeletal: No worrisome lytic or sclerotic osseous
abnormality.
IMPRESSION: 1. Lung-RADS 3, probably benign findings. Short-term follow-up in 6
months is recommended with repeat low-dose chest CT without contrast
(please use the following order, "CT CHEST LCS NODULE FOLLOW-UP W/O
CM").
2.  Emphysema ([LP]-[LP]) and Aortic Atherosclerosis ([LP]-170.0)

## 2021-06-10 NOTE — Progress Notes (Signed)
Virtual Visit via Video Note  I connected with Brian Wolfe on 06/10/21 at  9:00 AM EST by a video enabled telemedicine application and verified that I am speaking with the correct person using two identifiers.  Location: Patient: In dentists chair Provider: [address], [place], [place], [address]    I discussed the limitations of evaluation and management by telemedicine and the availability of in person appointments. The patient expressed understanding and agreed to proceed.   Shared Decision Making Visit Lung Cancer Screening Program [phone])   Eligibility: Age 60 y.o. Pack Years Smoking History Calculation 77 pack year smoking history (# packs/per year x # years smoked) Recent History of coughing up blood  no Unexplained weight loss? no ( >Than 15 pounds within the last 6 months ) Prior History Lung / other cancer no (Diagnosis within the last 5 years already requiring surveillance chest CT Scans). Smoking Status Current Smoker Former Smokers: Years since quit:  NA  Quit Date: NA  Visit Components: Discussion included one or more decision making aids. yes Discussion included risk/benefits of screening. yes Discussion included potential follow up diagnostic testing for abnormal scans. yes Discussion included meaning and risk of over diagnosis. yes Discussion included meaning and risk of False Positives. yes Discussion included meaning of total radiation exposure. yes  Counseling Included: Importance of adherence to annual lung cancer LDCT screening. yes Impact of comorbidities on ability to participate in the program. yes Ability and willingness to under diagnostic treatment. yes  Smoking Cessation Counseling: Current Smokers:  Discussed importance of smoking cessation. yes Information about tobacco cessation classes and interventions provided to patient. yes Patient provided with "ticket" for LDCT Scan. yes Symptomatic Patient. no  Counseling  NA Diagnosis Code: Tobacco Use Z72.0 Asymptomatic Patient yes  Counseling (Intermediate counseling: > three minutes counseling) D5329 Former Smokers:  Discussed the importance of maintaining cigarette abstinence. yes Diagnosis Code: Personal History of Nicotine Dependence. J24.268 Information about tobacco cessation classes and interventions provided to patient. Yes Patient provided with "ticket" for LDCT Scan. yes Written Order for Lung Cancer Screening with LDCT placed in Epic. Yes (CT Chest Lung Cancer Screening Low Dose W/O CM) [id number] Z12.2-Screening of respiratory organs Z87.891-Personal history of nicotine dependence  I have spent 25 minutes of face to face/ virtual visit   time with Brian Wolfe discussing the risks and benefits of lung cancer screening. We viewed / discussed a power point together that explained in detail the above noted topics. We paused at intervals to allow for questions to be asked and answered to ensure understanding.We discussed that the single most powerful action that he can take to decrease his risk of developing lung cancer is to quit smoking. We discussed whether or not he is ready to commit to setting a quit date. We discussed options for tools to aid in quitting smoking including nicotine replacement therapy, non-nicotine medications, support groups, Quit Smart classes, and behavior modification. We discussed that often times setting smaller, more achievable goals, such as eliminating 1 cigarette a day for a week and then 2 cigarettes a day for a week can be helpful in slowly decreasing the number of cigarettes smoked. This allows for a sense of accomplishment as well as providing a clinical benefit. I provided  him  with smoking cessation  information  with contact information for community resources, classes, free nicotine replacement therapy, and access to mobile apps, text messaging, and on-line smoking cessation help. I have also provided  him  the  office contact  information in the event he needs to contact me, or the screening staff. We discussed the time and location of the scan, and that either Doroteo Glassman RN, Joella Prince, RN  or I will call / send a letter with the results within 24-72 hours of receiving them. The patient verbalized understanding of all of  the above and had no further questions upon leaving the office. They have my contact information in the event they have any further questions.  I spent 3 minutes counseling on smoking cessation and the health risks of continued tobacco abuse.  I explained to the patient that there has been a high incidence of coronary artery disease noted on these exams. I explained that this is a non-gated exam therefore degree or severity cannot be determined. This patient is on statin therapy. I have asked the patient to follow-up with their PCP regarding any incidental finding of coronary artery disease and management with diet or medication as their PCP  feels is clinically indicated. The patient verbalized understanding of the above and had no further questions upon completion of the visit.      Magdalen Spatz, NP 06/10/2021

## 2021-06-10 NOTE — Patient Instructions (Signed)
Thank you for participating in the [facility] Cancer Screening Program. °It was our pleasure to meet you today. °We will call you with the results of your scan within the next few days. °Your scan will be assigned a Lung RADS category score by the physicians reading the scans.  °This Lung RADS score determines follow up scanning.  °See below for description of categories, and follow up screening recommendations. °We will be in touch to schedule your follow up screening annually or based on recommendations of our providers. °We will fax a copy of your scan results to your Primary Care Physician, or the physician who referred you to the program, to ensure they have the results. °Please call the office if you have any questions or concerns regarding your scanning experience or results.  °Our office number is [phone]. °Please speak with Denise Phelps, RN. She is our Lung Cancer Screening RN. °If she is unavailable when you call, please have the office staff send her a message. She will return your call at her earliest convenience. °Remember, if your scan is normal, we will scan you annually as long as you continue to meet the criteria for the program. (Age 55-77, Current smoker or smoker who has quit within the last 15 years). °If you are a smoker, remember, quitting is the single most powerful action that you can take to decrease your risk of lung cancer and other pulmonary, breathing related problems. °We know quitting is hard, and we are here to help.  °Please let us know if there is anything we can do to help you meet your goal of quitting. °If you are a former smoker, congratulations. We are proud of you! Remain smoke free! °Remember you can refer friends or family members through the number above.  °We will screen them to make sure they meet criteria for the program. °Thank you for helping us take better care of you by participating in Lung Screening. ° °You can receive free nicotine replacement therapy  ( patches, gum or mints) by calling 1-800-QUIT NOW. Please call so we can get you on the path to becoming  a non-smoker. I know it is hard, but you can do this! ° °Lung RADS Categories: ° °Lung RADS 1: no nodules or definitely non-concerning nodules.  °Recommendation is for a repeat annual scan in 12 months. ° °Lung RADS 2:  nodules that are non-concerning in appearance and behavior with a very low likelihood of becoming an active cancer. °Recommendation is for a repeat annual scan in 12 months. ° °Lung RADS 3: nodules that are probably non-concerning , includes nodules with a low likelihood of becoming an active cancer.  Recommendation is for a 6-month repeat screening scan. Often noted after an upper respiratory illness. We will be in touch to make sure you have no questions, and to schedule your 6-month scan. ° °Lung RADS 4 A: nodules with concerning findings, recommendation is most often for a follow up scan in 3 months or additional testing based on our provider's assessment of the scan. We will be in touch to make sure you have no questions and to schedule the recommended 3 month follow up scan. ° °Lung RADS 4 B:  indicates findings that are concerning. We will be in touch with you to schedule additional diagnostic testing based on our provider's  assessment of the scan. ° °Hypnosis for smoking cessation  °[employer] °[phone] ° °Acupuncture for smoking cessation  °[facility] °[phone]  °

## 2021-06-11 ENCOUNTER — Other Ambulatory Visit: Payer: BLUE CROSS/BLUE SHIELD

## 2021-07-09 ENCOUNTER — Encounter: Payer: Self-pay | Admitting: Family Medicine

## 2021-07-09 ENCOUNTER — Telehealth: Payer: Self-pay | Admitting: Acute Care

## 2021-07-09 DIAGNOSIS — F17210 Nicotine dependence, cigarettes, uncomplicated: Secondary | ICD-10-CM

## 2021-07-09 NOTE — Telephone Encounter (Signed)
Spoke with pt's wife (DPR) and advised of pt's recent CT results. She verbalized understanding and is aware that we will repeat the scan in 6 months. CT results faxed to PCP and order placed for 6 mth nodule f/u CT.

## 2021-08-02 ENCOUNTER — Ambulatory Visit
Admission: RE | Admit: 2021-08-02 | Discharge: 2021-08-02 | Disposition: A | Discharge status: Home / Self Care | Payer: BLUE CROSS/BLUE SHIELD | Source: Ambulatory Visit | Attending: Emergency Medicine | Admitting: Emergency Medicine

## 2021-08-02 ENCOUNTER — Other Ambulatory Visit: Payer: Self-pay

## 2021-08-02 VITALS — BP 146/84 | HR 64 | Temp 97.9°F | Resp 20

## 2021-08-02 DIAGNOSIS — J441 Chronic obstructive pulmonary disease with (acute) exacerbation: Secondary | ICD-10-CM

## 2021-08-02 DIAGNOSIS — R0602 Shortness of breath: Secondary | ICD-10-CM

## 2021-08-02 DIAGNOSIS — J0100 Acute maxillary sinusitis, unspecified: Secondary | ICD-10-CM

## 2021-08-02 MED ORDER — AZITHROMYCIN 250 MG PO TABS: 250.0000 mg | ORAL_TABLET | Freq: Every day | ORAL | 0 refills | Status: DC

## 2021-08-02 MED ORDER — BENZONATATE 100 MG PO CAPS: 100.0000 mg | ORAL_CAPSULE | Freq: Three times a day (TID) | ORAL | 0 refills | Status: DC | PRN

## 2021-08-02 MED ORDER — PREDNISONE 10 MG PO TABS: 40.0000 mg | ORAL_TABLET | Freq: Every day | ORAL | 0 refills | Status: AC
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
## 2021-08-02 NOTE — Discharge Instructions (Addendum)
Take the Zithromax, prednisone, and Tessalon Perles as directed.  Follow up with your primary care provider.

## 2021-08-02 NOTE — ED Triage Notes (Signed)
Pt here with cough, congestion, headache, fatigue x 8 days.

## 2021-08-15 ENCOUNTER — Encounter: Payer: Self-pay | Admitting: Family Medicine

## 2021-08-16 ENCOUNTER — Encounter: Payer: Self-pay | Admitting: Family Medicine

## 2021-08-18 NOTE — Telephone Encounter (Signed)
Pts wife called this morning asking if /Dr K or his nurse could call them back regarding this message,  CB#  [phone]

## 2021-08-19 NOTE — Telephone Encounter (Signed)
I have replied to the original [employer] answering their questions best I can. ? ?Here is a copy of my response. If you want to call Apolonio Schneiders back and make sure they can access it. ? ?------------- ? ?Thanks for reaching out. It looks like the message was sent on weekend, and we did have a delay in receiving it Mon/Tues, so thanks for your patience. ? ?For sure the infection can naturally raise sugar and also the Prednisone can be a major culprit for raising sugar, it can be a delayed response and can last for decent amount of time days to week +, not just while you are taking it. So that is not surprising. ? ?Your A1c has been controlled in recent history dating back to Sept 2022. ? ?Metformin 750mg  XR can be doubled to 1500mg  per dose once a day if you prefer to help get tighter glucose control. ? ?I reviewed the phone message as well that we received. ? ?The rising blood sugar after morning without eating can be common occurrence, now those exact numbers may be a little difficult to explain. However, it is known that our bodies will release extra sugar overnight and we often see a spike at times in response to naturally lowering sugar that occurs when we sleep. Basically as we start our day sugar often rises due to the body's natural rhythm. Skipping meals can cause body to over-correct and fluctuate sugar, again I don't know how to explain >340+ without any intake at all. ? ?Day to day blood sugar fluctuation should not cause major harm to you, as mentioned your blood sugar on average 3 month A1c is 6-7% range and well controlled. ? ?Goal is < 150-170 range or better. ? ?Other than improving diet and doubling Metformin XR 750mg  to x 2 = 1500mg , there is not much else to do right now. ? ?I think the longer time passes from the recent illness and prednisone it should stabilize. ? ?[name], DO ?[facility] ?[facility] ?08/19/2021, 9:38 AM ?

## 2021-08-24 ENCOUNTER — Other Ambulatory Visit: Payer: Self-pay | Admitting: Family Medicine

## 2021-08-24 DIAGNOSIS — F419 Anxiety disorder, unspecified: Secondary | ICD-10-CM

## 2021-08-24 DIAGNOSIS — K219 Gastro-esophageal reflux disease without esophagitis: Secondary | ICD-10-CM

## 2021-08-24 DIAGNOSIS — F32A Depression, unspecified: Secondary | ICD-10-CM

## 2021-08-24 DIAGNOSIS — E1169 Type 2 diabetes mellitus with other specified complication: Secondary | ICD-10-CM

## 2021-08-24 DIAGNOSIS — E785 Hyperlipidemia, unspecified: Secondary | ICD-10-CM

## 2021-08-25 NOTE — Telephone Encounter (Signed)
Requested medication (s) are due for refill today: Yes ? ?Requested medication (s) are on the active medication list: Yes ? ?Last refill:  08/20/20 ? ?Future visit scheduled: Yes ? ?Notes to clinic:  Prescriptions have expired. ? ? ? ?Requested Prescriptions  ?Pending Prescriptions Disp Refills  ? omeprazole (PRILOSEC) 20 MG capsule [Pharmacy Med Name: OMEPRAZOLE DR 20 MG CAPSULE] 90 capsule 3  ?  Sig: TAKE 1 CAPSULE BY MOUTH DAILY BEFORE BREAKFAST.  ?  ? Gastroenterology: Proton Pump Inhibitors Passed - 08/24/2021  1:46 AM  ?  ?  Passed - Valid encounter within last 12 months  ?  Recent Outpatient Visits   ? ?      ? 5 months ago Annual physical exam  ? [place], DO  ? 1 year ago Type 2 diabetes mellitus with other specified complication, without long-term current use of insulin ([place])  ? [place], DO  ? 1 year ago Type 2 diabetes mellitus without complication, without long-term current use of insulin ([place])  ? [place], FNP  ? 1 year ago COPD exacerbation [facility])  ? [facility], Lupita Raider, FNP  ? 1 year ago Type 2 diabetes mellitus without complication, without long-term current use of insulin ([place])  ? [facility], Lupita Raider, FNP  ? ?  ?  ?Future Appointments   ? ?        ? In 1 week Parks Ranger, Devonne Doughty, DO [facility], [place]  ? In 3 months Karamalegos, Devonne Doughty, DO [facility], [facility]  ? ?  ? ?  ?  ?  ? atorvastatin (LIPITOR) 20 MG tablet [Pharmacy Med Name: ATORVASTATIN 20 MG TABLET] 90 tablet 3  ?  Sig: TAKE 1 TABLET BY MOUTH EVERY DAY  ?  ? Cardiovascular:  Antilipid - Statins Failed - 08/24/2021  1:46 AM  ?  ?  Failed - Lipid Panel in normal range within the last 12 months  ?  Cholesterol, Total  ?Date Value Ref Range Status  ?07/29/2015 165 100 - 199 mg/dL Final  ? ?Cholesterol  ?Date Value Ref Range Status  ?03/13/2021  146 <200 mg/dL Final  ? ?LDL Cholesterol (Calc)  ?Date Value Ref Range Status  ?03/13/2021 80 mg/dL (calc) Final  ?  Comment:  ?  Reference range: <100 ?Marland Kitchen ?Desirable range <100 mg/dL for primary prevention;   ?<70 mg/dL for patients with CHD or diabetic patients  ?with > or = 2 CHD risk factors. ?. ?LDL-C is now calculated using the Martin-Hopkins  ?calculation, which is a validated novel method providing  ?better accuracy than the Friedewald equation in the  ?estimation of LDL-C.  ?Cresenciano Genre et al. Annamaria Helling. 6256;389(37): 2061-2068  ?(http://education.[employer].com/faq/FAQ164) ?  ? ?HDL  ?Date Value Ref Range Status  ?03/13/2021 46 > OR = 40 mg/dL Final  ?07/29/2015 37 (L) >39 mg/dL Final  ? ?Triglycerides  ?Date Value Ref Range Status  ?03/13/2021 104 <150 mg/dL Final  ? ?  ?  ?  Passed - Patient is not pregnant  ?  ?  Passed - Valid encounter within last 12 months  ?  Recent Outpatient Visits   ? ?      ? 5 months ago Annual physical exam  ? [place], DO  ? 1 year ago Type 2 diabetes mellitus with other specified complication,  without long-term current use of insulin ([place])  ? [place], DO  ? 1 year ago Type 2 diabetes mellitus without complication, without long-term current use of insulin ([place])  ? [place], FNP  ? 1 year ago COPD exacerbation [facility])  ? [facility] Lupita Raider, FNP  ? 1 year ago Type 2 diabetes mellitus without complication, without long-term current use of insulin ([place])  ? [facility], Lupita Raider, FNP  ? ?  ?  ?Future Appointments   ? ?        ? In 1 week Parks Ranger, Devonne Doughty, DO [facility], [place]  ? In 3 months Karamalegos, [facility], [facility]  ? ?  ? ?  ?  ?  ? escitalopram (LEXAPRO) 10 MG tablet [Pharmacy Med Name: ESCITALOPRAM 10 MG TABLET] 90 tablet 3  ?  Sig: TAKE 1 TABLET BY MOUTH  EVERY DAY  ?  ? Psychiatry:  Antidepressants - SSRI Passed - 08/24/2021  1:46 AM  ?  ?  Passed - Completed PHQ-2 or PHQ-9 in the last 360 days  ?  ?  Passed - Valid encounter within last 6 months  ?  Recent Outpatient Visits   ? ?      ? 5 months ago Annual physical exam  ? [place], DO  ? 1 year ago Type 2 diabetes mellitus with other specified complication, without long-term current use of insulin ([place])  ? [place], DO  ? 1 year ago Type 2 diabetes mellitus without complication, without long-term current use of insulin ([place])  ? [place], FNP  ? 1 year ago COPD exacerbation [facility])  ? [facility], Lupita Raider, FNP  ? 1 year ago Type 2 diabetes mellitus without complication, without long-term current use of insulin ([place])  ? [facility], Lupita Raider, FNP  ? ?  ?  ?Future Appointments   ? ?        ? In 1 week Parks Ranger, Devonne Doughty, DO [facility], [place]  ? In 3 months Parks Ranger, Devonne Doughty, DO [facility], [place]  ? ?  ? ?  ?  ?  ? ?

## 2021-09-03 ENCOUNTER — Ambulatory Visit: Payer: BLUE CROSS/BLUE SHIELD | Admitting: Family Medicine

## 2021-09-03 ENCOUNTER — Encounter: Payer: Self-pay | Admitting: Family Medicine

## 2021-09-03 VITALS — BP 128/62 | HR 67 | Ht 65.0 in | Wt 165.2 lb

## 2021-09-03 DIAGNOSIS — Z720 Tobacco use: Secondary | ICD-10-CM | POA: Diagnosis not present

## 2021-09-03 DIAGNOSIS — E1169 Type 2 diabetes mellitus with other specified complication: Secondary | ICD-10-CM

## 2021-09-03 DIAGNOSIS — I700 Atherosclerosis of aorta: Secondary | ICD-10-CM

## 2021-09-03 LAB — POCT GLYCOSYLATED HEMOGLOBIN (HGB A1C): Hemoglobin A1C: 9.1 % — AB (ref 4.0–5.6)

## 2021-09-03 MED ORDER — OZEMPIC (0.25 OR 0.5 MG/DOSE) 2 MG/1.5ML ~~LOC~~ SOPN: 0.2500 mg | PEN_INJECTOR | SUBCUTANEOUS | 1 refills | Status: DC

## 2021-09-03 NOTE — Patient Instructions (Addendum)
Thank you for coming to the office today. ? ?Recent Labs  ?  03/13/21 ?0858 09/03/21 ?0901  ?HGBA1C 7.1* 9.1*  ? ? ?Ozempic (Semaglutide injection) - start 0.'25mg'$  weekly for 4 weeks then increase to 0.'5mg'$  weekly, sample is 6 doses, re-use the same pen until empty, new needle each dose. ? ? ?Double Metformin XR '1500mg'$  daily, double dose at one time. We can order more in 3-4 weeks when you are ready ? ? ? ?Please schedule a Follow-up Appointment to: Return in about 5 weeks (around 10/08/2021) for 5 week lab only for A1c. ? ?If you have any other questions or concerns, please feel free to call the office or send a message through [place]. You may also schedule an earlier appointment if necessary. ? ?Additionally, you may be receiving a survey about your experience at our office within a few days to 1 week by e-mail or mail. We value your feedback. ? ?[name], DO ?[place] ?

## 2021-09-03 NOTE — Assessment & Plan Note (Signed)
Elevated A1c to 9.1, attributed to prednisone and recent lifestyle changes ?Complications - hyperlipidemia, GERD ? ?Plan:  ?1. Double dose Metformin XR from 750 to x 2 = '1500mg'$  daily ?2. Start Sample Ozempic 0.'25mg'$  inj weekly x 4 weeks then up to 0.'5mg'$  weekly x 2 weeks - will order for PA approval ?3. Encourage improved lifestyle - low carb, low sugar diet, reduce portion size, continue improving regular exercise ?4. Check CBG , bring log to next visit for review ?Continue Statin ? 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

## 2021-09-03 NOTE — Progress Notes (Signed)
? ?Subjective:  ? ? Patient ID: Brian Wolfe, male    DOB: January 21, 1961, 61 y.o.   MRN: [id number] ? ?Brian Wolfe is a 61 y.o. male presenting on 09/03/2021 for Diabetes ? ? ?HPI ? ?CHRONIC DM, Type 2: ?Hyperlipidemia ? ?Concerns since last visit 02/2021, he has had dramatic rise in blood sugar, in past few months, following respiratory infection and antibiotic steroid course ? ?He admits sensation that R side of face completely numb for 45 seconds, temporary then resolved, occurred about 5 x since January 2023. Has not involved arm or leg. ? ?Home CBG readings: 150-250+, avg seems around 200 ? 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 ?Tobacco hypnosis, down from pack a day down to 4 cigs. ?Worse sugars and more snacking on low cigarettes ? ?Meds: Metformin XR '750mg'$  daily has not increased dose yet ?Reports good compliance. 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 ?  ?Insomnia ?Difficulty falling asleep. Also problem with maintaining sleep. 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 ?Denies breathing or pain or other issues related to sleep. ? ? ?Depression screen [facility] 2/9 09/03/2021 03/13/2021 12/17/2019  ?Decreased Interest 1 1 0  ?Down, Depressed, Hopeless 0 0 0  ?PHQ - 2 Score 1 1 0  ?Altered sleeping 3 1 0  ?Tired, decreased energy 2 2 0  ?Change in appetite 0 0 0  ?Feeling bad or failure about yourself  1 1 0  ?Trouble concentrating 0 0 0  ?Moving slowly or fidgety/restless 1 0 0  ?Suicidal thoughts 0 0 0  ?PHQ-9 Score 8 5 0  ?Difficult doing work/chores Not difficult at all Not difficult at all Not difficult at all  ?Some recent data might be hidden  ? ?GAD 7 : Generalized Anxiety Score 09/03/2021 03/13/2021 10/04/2018  ?Nervous, Anxious, on Edge '2 1 1  '$ ?Control/stop worrying 2 1 0  ?Worry too  much - different things 2 0 0  ?Trouble relaxing '2 1 1  '$ ?Restless 2 1 0  ?Easily annoyed or irritable 2 0 2  ?Afraid - awful might happen 1 0 0  ?Total GAD 7 Score '13 4 4  '$ ?Anxiety Difficulty Not difficult at all Not difficult at all Not difficult at all  ? ? ? ? ?Social History  ? ?Tobacco Use  ? Smoking status: Every Day  ?  Packs/day: 1.00  ?  Years: 40.00  ?  Pack years: 40.00  ?  Types: Cigarettes  ? Smokeless tobacco: Never  ?Vaping Use  ? Vaping Use: Never used  ?Substance Use Topics  ? Alcohol use: No  ?  Alcohol/week: 0.0 standard drinks  ? Drug use: No  ? ? ?Review of Systems ?Per HPI unless specifically indicated above ? ?   ?Objective:  ?  ?BP 128/62   Pulse 67   Ht '5\' 5"'$  (1.651 m)   Wt 165 lb 3.2 oz (74.9 kg)   SpO2 99%   BMI 27.49 kg/m?   ?Wt Readings from Last 3 Encounters:  ?09/03/21 165 lb 3.2 oz (74.9 kg)  ?06/10/21 162 lb (73.5 kg)  ?03/13/21 162 lb 6.4 oz (73.7 kg)  ?  ?Physical Exam ?Vitals and nursing note reviewed.  ?Constitutional:   ?   General: He is not in acute distress. ?   Appearance: Normal  appearance. He is well-developed. He is not diaphoretic.  ?   Comments: Well-appearing, comfortable, cooperative  ?HENT:  ?   Head: Normocephalic and atraumatic.  ?Eyes:  ?   General:     ?   Right eye: No discharge.     ?   Left eye: No discharge.  ?   Conjunctiva/sclera: Conjunctivae normal.  ?Cardiovascular:  ?   Rate and Rhythm: Normal rate.  ?Pulmonary:  ?   Effort: Pulmonary effort is normal.  ?Skin: ?   General: Skin is warm and dry.  ?   Findings: No erythema or rash.  ?Neurological:  ?   Mental Status: He is alert and oriented to person, place, and time.  ?Psychiatric:     ?   Mood and Affect: Mood normal.     ?   Behavior: Behavior normal.     ?   Thought Content: Thought content normal.  ?   Comments: Well groomed, good eye contact, normal speech and thoughts  ? ? ?I have personally reviewed the radiology report from 06/10/2021  LDCT. ? ?CLINICAL DATA:  61 year old male with 23  pack-year history of ?smoking. Lung cancer screening. ?  ?EXAM: ?CT CHEST WITHOUT CONTRAST LOW-DOSE FOR LUNG CANCER SCREENING ?  ?TECHNIQUE: ?Multidetector CT imaging of the chest was performed following the ?standard protocol without IV contrast. ?  ?COMPARISON:  Standard CT chest 04/01/2009 ?  ?FINDINGS: ?Cardiovascular: The heart size is normal. No substantial pericardial ?effusion. Coronary artery calcification is evident. Mild ?atherosclerotic calcification is noted in the wall of the thoracic ?aorta. ?  ?Mediastinum/Nodes: No mediastinal lymphadenopathy. No evidence for ?gross hilar lymphadenopathy although assessment is limited by the ?lack of intravenous contrast on the current study. The esophagus has ?normal imaging features. There is no axillary lymphadenopathy. ?  ?Lungs/Pleura: Centrilobular and paraseptal emphysema evident. ?Scattered bilateral pulmonary nodules are evident, including ?dominant 6.7 mm nodule in the peripheral left upper lobe (image ?180). No focal airspace consolidation. No pleural effusion. ?  ?Upper Abdomen: Unremarkable. ?  ?Musculoskeletal: No worrisome lytic or sclerotic osseous ?abnormality. ?  ?IMPRESSION: ?1. Lung-RADS 3, probably benign findings. Short-term follow-up in 6 ?months is recommended with repeat low-dose chest CT without contrast ?(please use the following order, "CT CHEST LCS NODULE FOLLOW-UP W/O ?CM"). ?2.  Emphysema (ICD10-J43.9) and Aortic Atherosclerosis (ICD10-170.0) ?  ?  ?Electronically Signed ?  By: Misty Stanley M.D. ?  On: 06/11/2021 07:15 ? ?Results for orders placed or performed in visit on 09/03/21  ?POCT HgB A1C  ?Result Value Ref Range  ? Hemoglobin A1C 9.1 (A) 4.0 - 5.6 %  ? ?   ?Assessment & Plan:  ? ?Problem List Items Addressed This Visit   ? ? Type 2 diabetes mellitus with other specified complication ([place]) - Primary  ?  Elevated A1c to 9.1, attributed to prednisone and recent lifestyle changes ?Complications - hyperlipidemia, GERD ? ?Plan:  ?1.  Double dose Metformin XR from 750 to x 2 = '1500mg'$  daily ?2. Start Sample Ozempic 0.'25mg'$  inj weekly x 4 weeks then up to 0.'5mg'$  weekly x 2 weeks - will order for PA approval ?3. Encourage improved lifestyle - low carb, low sugar diet, reduce portion size, continue improving regular exercise ?4. Check CBG , bring log to next visit for review ?Continue Statin ? 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  ?  ? Relevant Medications  ? OZEMPIC, 0.25 OR 0.5 MG/DOSE, 2 MG/1.5ML SOPN  ? metFORMIN (GLUCOPHAGE-XR) 750  MG 24 hr tablet  ? Other Relevant Orders  ? POCT HgB A1C (Completed)  ? ?Other Visit Diagnoses   ? ? Tobacco abuse      ? Atherosclerosis of aorta ([place])      ? ?  ?  ?Atherosclerosis Aorta ?Identified on last LDCT scan 2023, described as mild ?On Atorvastatin currently ?Discussed ASCVD risk and reduction w/ smoking cessation in future ? ?Meds ordered this encounter  ?Medications  ? OZEMPIC, 0.25 OR 0.5 MG/DOSE, 2 MG/1.5ML SOPN  ?  Sig: Inject 0.25 mg into the skin once a week. For first 4 weeks. Then increase dose to 0.'5mg'$  weekly  ?  Dispense:  4.5 mL  ?  Refill:  1  ?  84 day supply / 3 month  ? ? ? ?Follow up plan: ?Return in about 5 weeks (around 10/08/2021) for 5 week lab only for A1c. ? ? ?[name], DO ?[facility] ?[facility] ?09/03/2021, 9:01 AM ?

## 2021-09-05 ENCOUNTER — Encounter: Payer: Self-pay | Admitting: Family Medicine

## 2021-09-24 ENCOUNTER — Other Ambulatory Visit: Payer: Self-pay | Admitting: Urology

## 2021-09-24 NOTE — Telephone Encounter (Signed)
Ok to fill? Pt haven't been seen in a year, he schedules and then cancels, last testosterone 05/2021. ?No future appts scheduled for OV or labs ?

## 2021-09-28 ENCOUNTER — Other Ambulatory Visit: Payer: Self-pay | Admitting: Urology

## 2021-09-30 ENCOUNTER — Other Ambulatory Visit: Payer: Self-pay

## 2021-09-30 DIAGNOSIS — E349 Endocrine disorder, unspecified: Secondary | ICD-10-CM

## 2021-09-30 DIAGNOSIS — E1169 Type 2 diabetes mellitus with other specified complication: Secondary | ICD-10-CM

## 2021-10-01 ENCOUNTER — Other Ambulatory Visit: Payer: BLUE CROSS/BLUE SHIELD

## 2021-10-01 DIAGNOSIS — E1169 Type 2 diabetes mellitus with other specified complication: Secondary | ICD-10-CM | POA: Diagnosis not present

## 2021-10-01 DIAGNOSIS — E349 Endocrine disorder, unspecified: Secondary | ICD-10-CM | POA: Diagnosis not present
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
## 2021-10-02 ENCOUNTER — Ambulatory Visit (INDEPENDENT_AMBULATORY_CARE_PROVIDER_SITE_OTHER): Payer: BLUE CROSS/BLUE SHIELD | Admitting: Urology

## 2021-10-02 ENCOUNTER — Encounter: Payer: Self-pay | Admitting: Urology

## 2021-10-02 VITALS — BP 129/78 | HR 62 | Ht 65.0 in | Wt 164.0 lb

## 2021-10-02 DIAGNOSIS — R972 Elevated prostate specific antigen [PSA]: Secondary | ICD-10-CM | POA: Diagnosis not present

## 2021-10-02 DIAGNOSIS — N401 Enlarged prostate with lower urinary tract symptoms: Secondary | ICD-10-CM | POA: Diagnosis not present

## 2021-10-02 DIAGNOSIS — N138 Other obstructive and reflux uropathy: Secondary | ICD-10-CM | POA: Diagnosis not present

## 2021-10-02 DIAGNOSIS — E291 Testicular hypofunction: Secondary | ICD-10-CM | POA: Diagnosis not present

## 2021-10-02 DIAGNOSIS — E349 Endocrine disorder, unspecified: Secondary | ICD-10-CM

## 2021-10-02 LAB — HEMOGLOBIN A1C
Hgb A1c MFr Bld: 7.8 % of total Hgb — ABNORMAL HIGH (ref <5.7)
Mean Plasma Glucose: 177 mg/dL
eAG (mmol/L): 9.8 mmol/L

## 2021-10-02 LAB — PSA: Prostate Specific Ag, Serum: 5.1 ng/mL — ABNORMAL HIGH (ref 0.0–4.0)

## 2021-10-02 LAB — HEMOGLOBIN AND HEMATOCRIT, BLOOD
Hematocrit: 43.3 % (ref 37.5–51.0)
Hemoglobin: 14.9 g/dL (ref 13.0–17.7)

## 2021-10-02 LAB — TESTOSTERONE: Testosterone: 348 ng/dL (ref 264–916)

## 2021-10-02 MED ORDER — "BD DISP NEEDLES 21G X 1-1/2"" MISC": 1.0000 mg | 0 refills | Status: DC

## 2021-10-05 ENCOUNTER — Other Ambulatory Visit: Payer: BLUE CROSS/BLUE SHIELD

## 2021-10-05 ENCOUNTER — Ambulatory Visit: Payer: BLUE CROSS/BLUE SHIELD | Admitting: Urology

## 2021-10-10 ENCOUNTER — Encounter: Payer: Self-pay | Admitting: Family Medicine

## 2021-10-10 DIAGNOSIS — E1169 Type 2 diabetes mellitus with other specified complication: Secondary | ICD-10-CM

## 2021-10-12 MED ORDER — METFORMIN HCL ER 750 MG PO TB24: 1500.0000 mg | ORAL_TABLET | Freq: Every day | ORAL | 1 refills | Status: DC

## 2021-10-19 ENCOUNTER — Ambulatory Visit: Payer: BLUE CROSS/BLUE SHIELD | Admitting: Family Medicine

## 2021-10-19 ENCOUNTER — Encounter: Payer: Self-pay | Admitting: Family Medicine

## 2021-10-19 ENCOUNTER — Ambulatory Visit: Payer: Self-pay | Admitting: *Deleted

## 2021-10-19 VITALS — BP 126/76 | HR 66 | Ht 65.0 in | Wt 158.8 lb

## 2021-10-19 DIAGNOSIS — R142 Eructation: Secondary | ICD-10-CM

## 2021-10-19 DIAGNOSIS — E1169 Type 2 diabetes mellitus with other specified complication: Secondary | ICD-10-CM

## 2021-10-19 DIAGNOSIS — K219 Gastro-esophageal reflux disease without esophagitis: Secondary | ICD-10-CM | POA: Diagnosis not present

## 2021-10-19 DIAGNOSIS — R140 Abdominal distension (gaseous): Secondary | ICD-10-CM

## 2021-10-19 NOTE — Telephone Encounter (Signed)
?  Chief Complaint: abdominal pain, bloating, belching,gas ?Symptoms: increased GI symptoms when increased Ozempic dosing ?Frequency: 1 week ?Pertinent Negatives: Patient denies constipation, diarrhea ?Disposition: '[]'$ ED /'[]'$ Urgent Care (no appt availability in office) / '[x]'$ Appointment(In office/virtual)/ '[]'$  [place] [facility]/ '[]'$ Home Care/ '[]'$ Refused Recommended Disposition /'[]'$ [place]/ '[]'$  Follow-up with PCP ?Additional Notes:   ? ? ?Reason for Disposition ? [1] MILD pain (e.g., does not interfere with normal activities) AND [2] pain comes and goes (cramps) [3] present > 48 hours  (Exception: this same abdominal pain is a chronic symptom recurrent or ongoing AND present > 4 weeks) ? ?Answer Assessment - Initial Assessment Questions ?1. LOCATION: "Where does it hurt?"  ?    R side under ribs ?2. RADIATION: "Does the pain shoot anywhere else?" (e.g., chest, back) ?    No radiation ?3. ONSET: "When did the pain begin?" (Minutes, hours or days ago)  ?    1 week ?4. SUDDEN: "Gradual or sudden onset?" ?    sudden ?5. PATTERN "Does the pain come and go, or is it constant?" ?   - If constant: "Is it getting better, staying the same, or worsening?"  ?    (Note: Constant means the pain never goes away completely; most serious pain is constant and it progresses)  ?   - If intermittent: "How long does it last?" "Do you have pain now?" ?    (Note: Intermittent means the pain goes away completely between bouts) ?    Comes and goes ?6. SEVERITY: "How bad is the pain?"  (e.g., Scale 1-10; mild, moderate, or severe) ?   - MILD (1-3): doesn't interfere with normal activities, abdomen soft and not tender to touch  ?   - MODERATE (4-7): interferes with normal activities or awakens from sleep, abdomen tender to touch  ?   - SEVERE (8-10): excruciating pain, doubled over, unable to do any normal activities   ?    mild ?7. RECURRENT SYMPTOM: "Have you ever had this type of stomach pain before?" If Yes, ask: "When was the  last time?" and "What happened that time?"  ?    no ?8. CAUSE: "What do you think is causing the stomach pain?" ?    Increase in ozempic ?9. RELIEVING/AGGRAVATING FACTORS: "What makes it better or worse?" (e.g., movement, antacids, bowel movement) ?    Belching,passing gas helps- bloating ?10. OTHER SYMPTOMS: "Do you have any other symptoms?" (e.g., back pain, diarrhea, fever, urination pain, vomiting) ?      diarrhea ? ?Protocols used: Abdominal Pain - Male-A-AH ? ?

## 2021-10-19 NOTE — Patient Instructions (Addendum)
Thank you for coming to the office today. ? ? ?Keep on the dialed back dose Ozempic 0.'25mg'$  for another 1-2 weeks. ?Let's see if the side effects go away. If they do, then it is a side effect of the higher dose. ? ?2. ?If side effects improve, and want to try again - we can go back to the 0.'5mg'$  for 2-4 week trial again. ? ?3. ?If side effects are not improving, we can adjust the metformin in the meantime. For example dial back from 2 pills Metformin down to 1 Metformin to see if that was contributing to GI side effects. ? ?Also consider higher dose Omeprazole for '40mg'$  in the morning to see if this works better, can double yours or order a new one. ? ?4. As it approaches July, if verdict is - no escaping the side effects, we can change our course. ? ?Could switch one for one - Ozempic for Trulicity. Same category just different version. Hopefully less side effects ? ?Could switch for the upgrade - Darcel Bayley (newest one on the market, upgrade to [employer]) ? ?Could switch to same medicine but pill Rybelsus. ? ?Recent Labs  ?  03/13/21 ?0858 09/03/21 ?0901 10/01/21 ?0855  ?HGBA1C 7.1* 9.1* 7.8*  ? ? ? ?Please schedule a Follow-up Appointment to: Return in about 2 months (around 12/19/2021) for 2 month DM A1c Med Adjust. ? ?If you have any other questions or concerns, please feel free to call the office or send a message through [place]. You may also schedule an earlier appointment if necessary. ? ?Additionally, you may be receiving a survey about your experience at our office within a few days to 1 week by e-mail or mail. We value your feedback. ? ?[name], DO ?[place] ?

## 2021-10-19 NOTE — Progress Notes (Signed)
? ?Subjective:  ? ? Patient ID: Brian Wolfe, male    DOB: [birth year], 61 y.o.   MRN: [id number] ? ?Brian Wolfe is a 61 y.o. male presenting on 10/19/2021 for Abdominal Pain, belching, and Gas ? ? ?HPI ? ?Abdominal Pain Cramping Belching Gas ?GERD ?T2DM ? ?Recent change with Ozempic dose increase from 0.'25mg'$  to 0.'50mg'$  weekly onset with side effects GI intolerance after first 2 weeks of that dose, then he has lowered dose back down to 0.'25mg'$  ? ?Describes belching bloating cramping Abnormal foul smelling gas. ? ?Has symptoms of GERD in back of throat. He is taking Omeprazole '20mg'$  daily in AM and he even doubled the dose in PM and took a 2nd Omeprazole. ? ?Already on Metformin XR '750mg'$  x 2 = '1500mg'$  daily ? ?R sided abdominal pain improved dramatically with belching and gas ?Abdominal bloating and nausea limits his PO intake ? ? ? ?  10/19/2021  ? 10:20 AM 09/03/2021  ?  8:59 AM 03/13/2021  ?  8:02 AM  ?Depression screen PHQ 2/9  ?Decreased Interest '1 1 1  '$ ?Down, Depressed, Hopeless 1 0 0  ?PHQ - 2 Score '2 1 1  '$ ?Altered sleeping 0 3 1  ?Tired, decreased energy '2 2 2  '$ ?Change in appetite 2 0 0  ?Feeling bad or failure about yourself  0 1 1  ?Trouble concentrating 0 0 0  ?Moving slowly or fidgety/restless 0 1 0  ?Suicidal thoughts 0 0 0  ?PHQ-9 Score '6 8 5  '$ ?Difficult doing work/chores Not difficult at all Not difficult at all Not difficult at all  ? ? ?Social History  ? ?Tobacco Use  ? Smoking status: Every Day  ?  Packs/day: 1.00  ?  Years: 40.00  ?  Pack years: 40.00  ?  Types: Cigarettes  ? Smokeless tobacco: Never  ?Vaping Use  ? Vaping Use: Never used  ?Substance Use Topics  ? Alcohol use: No  ?  Alcohol/week: 0.0 standard drinks  ? Drug use: No  ? ? ?Review of Systems ?Per HPI unless specifically indicated above ? ?   ?Objective:  ?  ?BP 126/76   Pulse 66   Ht '5\' 5"'$  (1.651 m)   Wt 158 lb 12.8 oz (72 kg)   SpO2 99%   BMI 26.43 kg/m?   ?Wt Readings from Last 3 Encounters:  ?10/19/21 158 lb 12.8 oz (72 kg)   ?10/02/21 164 lb (74.4 kg)  ?09/03/21 165 lb 3.2 oz (74.9 kg)  ?  ?Physical Exam ?Vitals and nursing note reviewed.  ?Constitutional:   ?   General: He is not in acute distress. ?   Appearance: Normal appearance. He is well-developed. He is not diaphoretic.  ?   Comments: Well-appearing, comfortable, cooperative  ?HENT:  ?   Head: Normocephalic and atraumatic.  ?Eyes:  ?   General:     ?   Right eye: No discharge.     ?   Left eye: No discharge.  ?   Conjunctiva/sclera: Conjunctivae normal.  ?Neck:  ?   Thyroid: No thyromegaly.  ?Cardiovascular:  ?   Rate and Rhythm: Normal rate and regular rhythm.  ?   Pulses: Normal pulses.  ?   Heart sounds: Normal heart sounds. No murmur heard. ?Pulmonary:  ?   Effort: Pulmonary effort is normal. No respiratory distress.  ?   Breath sounds: Normal breath sounds. No wheezing or rales.  ?Musculoskeletal:     ?   General: Normal range of  motion.  ?   Cervical back: Normal range of motion and neck supple.  ?Lymphadenopathy:  ?   Cervical: No cervical adenopathy.  ?Skin: ?   General: Skin is warm and dry.  ?   Findings: No erythema or rash.  ?Neurological:  ?   Mental Status: He is alert and oriented to person, place, and time. Mental status is at baseline.  ?Psychiatric:     ?   Mood and Affect: Mood normal.     ?   Behavior: Behavior normal.     ?   Thought Content: Thought content normal.  ?   Comments: Well groomed, good eye contact, normal speech and thoughts  ? ?Results for orders placed or performed in visit on 10/01/21  ?Hemoglobin and Hematocrit, Blood  ?Result Value Ref Range  ? Hemoglobin 14.9 13.0 - 17.7 g/dL  ? Hematocrit 43.3 37.5 - 51.0 %  ?PSA  ?Result Value Ref Range  ? Prostate Specific Ag, Serum 5.1 (H) 0.0 - 4.0 ng/mL  ?Testosterone  ?Result Value Ref Range  ? Testosterone 348 264 - 916 ng/dL  ? ?   ?Assessment & Plan:  ? ?Problem List Items Addressed This Visit   ? ? Type 2 diabetes mellitus with other specified complication ([place]) - Primary  ? GERD  (gastroesophageal reflux disease)  ? ?Other Visit Diagnoses   ? ? Abdominal bloating      ? Belching      ? ?  ?  ?Improved CBG on GLP1 Ozempic higher dose 0.'5mg'$  weekly ?Continues on Metformin  ? ?However at this time likely cause of his GI disturbances are functional and presumed side effect of Ozempic. ? ?Recommend stay on Ozempic 0.'25mg'$  lower dose for 1-2 weeks, see if GI side effect resolve ?Next options consider dose back to 0.'5mg'$  or switch GLP1 therapy. Or consider oral Rybelsus ?See AVS for more info ? ?GERD refractory can double PPI from 20 to '40mg'$  daily if not improving as well. ? ?A1c next visit 2 months. Re-schedule ? ?No orders of the defined types were placed in this encounter. ? ? ? ?Follow up plan: ?Return in about 2 months (around 12/19/2021) for 2 month DM A1c Med Adjust. ? ? ?[name], DO ?[facility] ?[facility] ?10/19/2021, 10:31 AM ?

## 2021-10-22 ENCOUNTER — Other Ambulatory Visit: Payer: Self-pay | Admitting: Family Medicine

## 2021-10-22 DIAGNOSIS — E1169 Type 2 diabetes mellitus with other specified complication: Secondary | ICD-10-CM

## 2021-10-22 NOTE — Telephone Encounter (Signed)
Requested medication (s) are due for refill today: no ? ?Requested medication (s) are on the active medication list: no ? ?Last refill:  10/12/21 ? ?Future visit scheduled: yes ? ?Notes to clinic:  Unable to refill per protocol, last refill by provider 10/12/21 for 180, 1 refill ? ? ?  ?Requested Prescriptions  ?Pending Prescriptions Disp Refills  ? metFORMIN (GLUCOPHAGE-XR) 750 MG 24 hr tablet [Pharmacy Med Name: METFORMIN HCL ER 750 MG TABLET] 90 tablet   ?  Sig: TAKE 1 TABLET BY MOUTH EVERY DAY WITH BREAKFAST  ?  ? Endocrinology:  Diabetes - Biguanides Failed - 10/22/2021  2:59 AM  ?  ?  Failed - B12 Level in normal range and within 720 days  ?  No results found for: VITAMINB12  ?  ?  ?  Passed - Cr in normal range and within 360 days  ?  Creat  ?Date Value Ref Range Status  ?03/13/2021 0.88 0.70 - 1.30 mg/dL Final  ?  ?  ?  ?  Passed - HBA1C is between 0 and 7.9 and within 180 days  ?  Hgb A1c MFr Bld  ?Date Value Ref Range Status  ?10/01/2021 7.8 (H) <5.7 % of total Hgb Final  ?  Comment:  ?  For someone without known diabetes, a hemoglobin A1c ?value of 6.5% or greater indicates that they may have  ?diabetes and this should be confirmed with a follow-up  ?test. ?. ?For someone with known diabetes, a value <7% indicates  ?that their diabetes is well controlled and a value  ?greater than or equal to 7% indicates suboptimal  ?control. A1c targets should be individualized based on  ?duration of diabetes, age, comorbid conditions, and  ?other considerations. ?. ?Currently, no consensus exists regarding use of ?hemoglobin A1c for diagnosis of diabetes for children. ?. ?  ?  ?  ?  ?  Passed - eGFR in normal range and within 360 days  ?  GFR, Est African American  ?Date Value Ref Range Status  ?04/10/2020 103 > OR = 60 mL/min/1.53m Final  ? ?GFR, Est Non African American  ?Date Value Ref Range Status  ?04/10/2020 89 > OR = 60 mL/min/1.759mFinal  ? ?eGFR  ?Date Value Ref Range Status  ?03/13/2021 99 > OR = 60  mL/min/1.7332minal  ?  Comment:  ?  The eGFR is based on the CKD-EPI 2021 equation. To calculate  ?the new eGFR from a previous Creatinine or Cystatin C ?result, go to https://www.kidney.org/professionals/ ?kdoqi/gfr%5Fcalculator ?  ?  ?  ?  ?  Passed - Valid encounter within last 6 months  ?  Recent Outpatient Visits   ? ?      ? 3 days ago Type 2 diabetes mellitus with other specified complication, without long-term current use of insulin ([place]? [place]  ? 1 month ago Type 2 diabetes mellitus with other specified complication, without long-term current use of insulin ([facility]? [facility]  ? 7 months ago Annual physical exam  ? [facility]  ? 1 year ago Type 2 diabetes mellitus with other specified complication, without long-term current use of insulin ([facility]? [facility]  ? 1 year ago Type 2 diabetes mellitus without complication, without long-term current use of insulin ([facility]? [facility]  ? ?  ?  ?  Future Appointments   ? ?        ? In 2 months [place], [facility], [facility]  ? ?  ? ? ?  ?  ?  Passed - CBC within normal limits and completed in the last 12 months  ?  WBC  ?Date Value Ref Range Status  ?03/13/2021 8.1 3.8 - 10.8 Thousand/uL Final  ? ?RBC  ?Date Value Ref Range Status  ?03/13/2021 4.68 4.20 - 5.80 Million/uL Final  ? ?Hemoglobin  ?Date Value Ref Range Status  ?10/01/2021 14.9 13.0 - 17.7 g/dL Final  ? ?Hematocrit  ?Date Value Ref Range Status  ?10/01/2021 43.3 37.5 - 51.0 % Final  ? ?MCHC  ?Date Value Ref Range Status  ?03/13/2021 34.3 32.0 - 36.0 g/dL Final  ? ?MCH  ?Date Value Ref Range Status  ?03/13/2021 31.8 27.0 - 33.0 pg Final  ? ?MCV  ?Date Value Ref Range Status  ?03/13/2021 92.7 80.0 - 100.0 fL Final  ?02/01/2018 94 79 - 97 fL Final   ?05/04/2014 94 80 - 100 fL Final  ? ?No results found for: PLTCOUNTKUC, LABPLAT, [place] ?RDW  ?Date Value Ref Range Status  ?03/13/2021 12.6 11.0 - 15.0 % Final  ?02/01/2018 13.3 12.3 - 15.4 % Final  ?05/04/2014 12.6 11.5 - 14.5 % Final  ? ?  ?  ?  ? ? ?

## 2021-10-26 ENCOUNTER — Ambulatory Visit
Admission: RE | Admit: 2021-10-26 | Discharge: 2021-10-26 | Disposition: A | Discharge status: Home / Self Care | Payer: BLUE CROSS/BLUE SHIELD | Source: Ambulatory Visit | Attending: Urology | Admitting: Urology

## 2021-10-26 DIAGNOSIS — N401 Enlarged prostate with lower urinary tract symptoms: Secondary | ICD-10-CM | POA: Diagnosis not present

## 2021-10-26 DIAGNOSIS — N138 Other obstructive and reflux uropathy: Secondary | ICD-10-CM | POA: Diagnosis not present

## 2021-10-26 DIAGNOSIS — R972 Elevated prostate specific antigen [PSA]: Secondary | ICD-10-CM | POA: Insufficient documentation

## 2021-10-26 DIAGNOSIS — Z87430 Personal history of prostatic dysplasia: Secondary | ICD-10-CM | POA: Diagnosis not present

## 2021-10-26 DIAGNOSIS — R590 Localized enlarged lymph nodes: Secondary | ICD-10-CM | POA: Diagnosis not present

## 2021-10-26 DIAGNOSIS — K4020 Bilateral inguinal hernia, without obstruction or gangrene, not specified as recurrent: Secondary | ICD-10-CM | POA: Diagnosis not present

## 2021-10-26 IMAGING — MR MR PROSTATE WO/W CM
56 series · 56 of 56 positions shown · IV contrast (7ml Gadavist)
Comparison: Imaging from [DATE].

CLINICAL DATA: A 60-year-old male presents for evaluation of
suspected prostate cancer in the setting of elevated PSA. Latest PSA
of 5.1. Increasing since [BH]. Previous biopsy from [BH] showed
high-grade prostate intraepithelial neoplasia.

EXAM:
MR PROSTATE WITHOUT AND WITH CONTRAST
TECHNIQUE: Multiplanar multisequence MRI images were obtained of the pelvis
centered about the prostate. Pre and post contrast images were
obtained.
CONTRAST:  7mL GADAVIST GADOBUTROL 1 MMOL/ML IV SOLN

[Series 3: ax in&out whole · axial · 6.0mm · 0.74mm/px · 1 of 35 slices shown (1 of 2)]
[im 1/35]
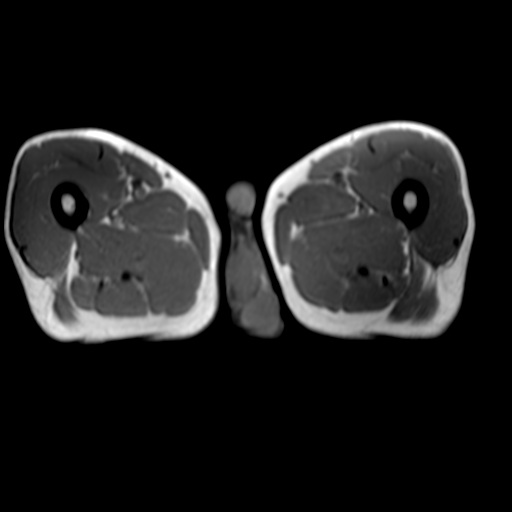

[Series 3: ax in&out whole · axial · 6.0mm · 0.74mm/px · 1 of 35 slices shown (2 of 2)]
[im 1/35]
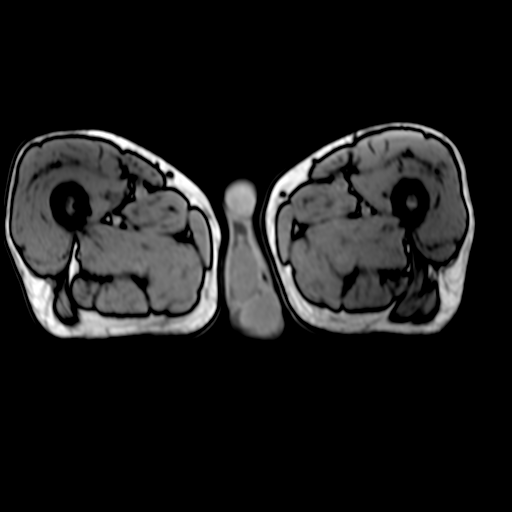

[Series 4: T2 · coronal · 3.0mm · 0.70mm/px · 1 of 35 slices shown (1 of 3)]
[im 1/35]
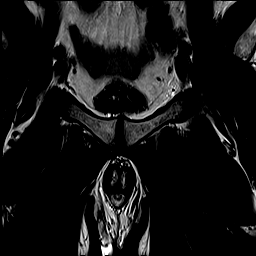

[Series 5: T2 · axial · 3.0mm · 0.56mm/px · 1 of 25 slices shown (2 of 3)]
[im 1/25]
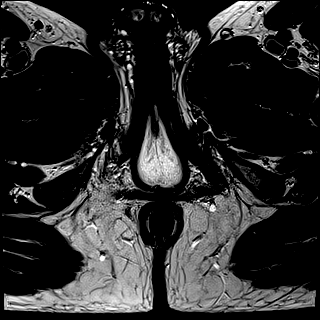

[Series 6: DWI · axial · 3.0mm · 0.86mm/px · 1 of 75 slices shown (1 of 3)]
[im 1/75]
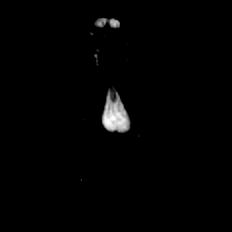

[Series 7: DWI · axial · 3.0mm · 0.86mm/px · 1 of 25 slices shown (2 of 3)]
[im 1/25]
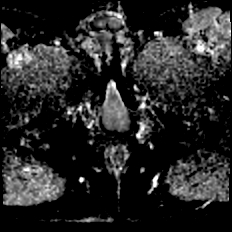

[Series 8: DWI · axial · 3.0mm · 0.86mm/px · 1 of 25 slices shown (3 of 3)]
[im 1/25]
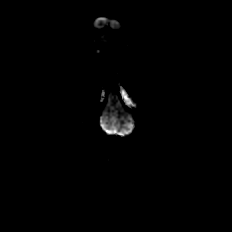

[Series 9: T2 · axial · 1.0mm · 1.04mm/px · 1 of 80 slices shown (3 of 3)]
[im 1/80]
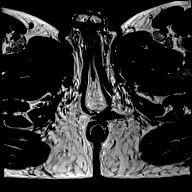

[Series 10: T1 · axial · 3.0mm · 1.15mm/px · 1 of 28 slices shown (1 of 48)]
[im 1/28]
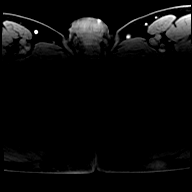

[Series 11: T1 · axial · 3.0mm · 1.15mm/px · 1 of 28 slices shown (2 of 48)]
[im 1/28]
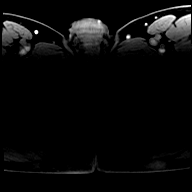

[Series 12: T1 · axial · 3.0mm · 1.15mm/px · 1 of 27 slices shown (3 of 48)]
[im 1/27]
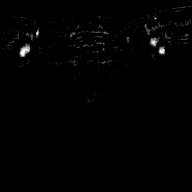

[Series 13: T1 · axial · 3.0mm · 1.15mm/px · 1 of 28 slices shown (4 of 48)]
[im 1/28]
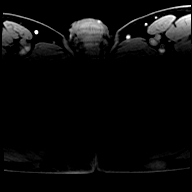

[Series 14: T1 · axial · 3.0mm · 1.15mm/px · 1 of 24 slices shown (5 of 48)]
[im 1/24]
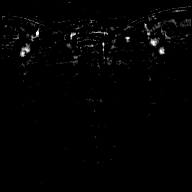

[Series 15: T1 · axial · 3.0mm · 1.15mm/px · 1 of 28 slices shown (6 of 48)]
[im 1/28]
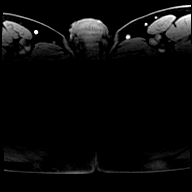

[Series 16: T1 · axial · 3.0mm · 1.15mm/px · 1 of 28 slices shown (7 of 48)]
[im 1/28]
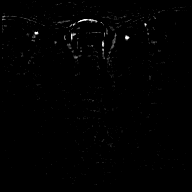

[Series 17: T1 · axial · 3.0mm · 1.15mm/px · 1 of 28 slices shown (8 of 48)]
[im 1/28]
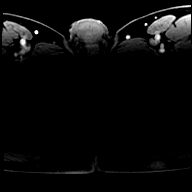

[Series 18: T1 · axial · 3.0mm · 1.15mm/px · 1 of 26 slices shown (9 of 48)]
[im 1/26]
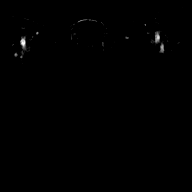

[Series 19: T1 · axial · 3.0mm · 1.15mm/px · 1 of 28 slices shown (10 of 48)]
[im 1/28]
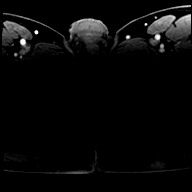

[Series 20: T1 · axial · 3.0mm · 1.15mm/px · 1 of 26 slices shown (11 of 48)]
[im 1/26]
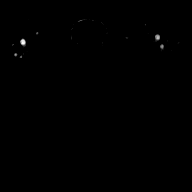

[Series 21: T1 · axial · 3.0mm · 1.15mm/px · 1 of 28 slices shown (12 of 48)]
[im 1/28]
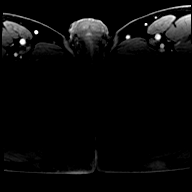

[Series 22: T1 · axial · 3.0mm · 1.15mm/px · 1 of 28 slices shown (13 of 48)]
[im 1/28]
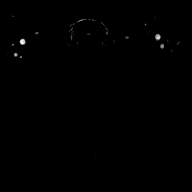

[Series 23: T1 · axial · 3.0mm · 1.15mm/px · 1 of 28 slices shown (14 of 48)]
[im 1/28]
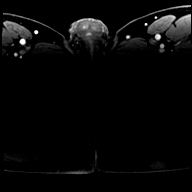

[Series 24: T1 · axial · 3.0mm · 1.15mm/px · 1 of 28 slices shown (15 of 48)]
[im 1/28]
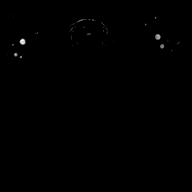

[Series 25: T1 · axial · 3.0mm · 1.15mm/px · 1 of 28 slices shown (16 of 48)]
[im 1/28]
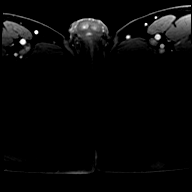

[Series 26: T1 · axial · 3.0mm · 1.15mm/px · 1 of 28 slices shown (17 of 48)]
[im 1/28]
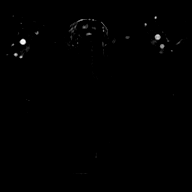

[Series 27: T1 · axial · 3.0mm · 1.15mm/px · 1 of 28 slices shown (18 of 48)]
[im 1/28]
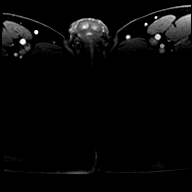

[Series 28: T1 · axial · 3.0mm · 1.15mm/px · 1 of 28 slices shown (19 of 48)]
[im 1/28]
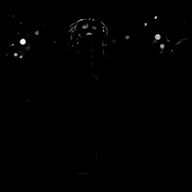

[Series 29: T1 · axial · 3.0mm · 1.15mm/px · 1 of 28 slices shown (20 of 48)]
[im 1/28]
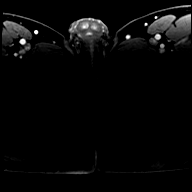

[Series 30: T1 · axial · 3.0mm · 1.15mm/px · 1 of 28 slices shown (21 of 48)]
[im 1/28]
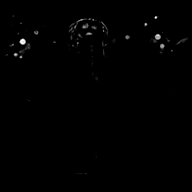

[Series 31: T1 · axial · 3.0mm · 1.15mm/px · 1 of 28 slices shown (22 of 48)]
[im 1/28]
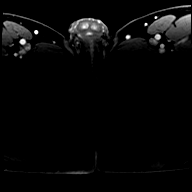

[Series 32: T1 · axial · 3.0mm · 1.15mm/px · 1 of 28 slices shown (23 of 48)]
[im 1/28]
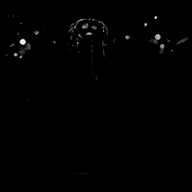

[Series 33: T1 · axial · 3.0mm · 1.15mm/px · 1 of 28 slices shown (24 of 48)]
[im 1/28]
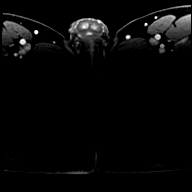

[Series 34: T1 · axial · 3.0mm · 1.15mm/px · 1 of 28 slices shown (25 of 48)]
[im 1/28]
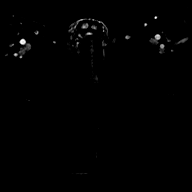

[Series 35: T1 · axial · 3.0mm · 1.15mm/px · 1 of 28 slices shown (26 of 48)]
[im 1/28]
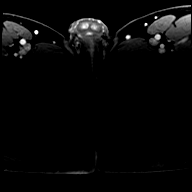

[Series 36: T1 · axial · 3.0mm · 1.15mm/px · 1 of 28 slices shown (27 of 48)]
[im 1/28]
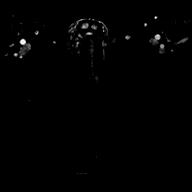

[Series 37: T1 · axial · 3.0mm · 1.15mm/px · 1 of 28 slices shown (28 of 48)]
[im 1/28]
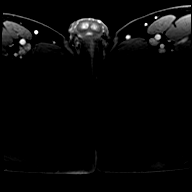

[Series 38: T1 · axial · 3.0mm · 1.15mm/px · 1 of 28 slices shown (29 of 48)]
[im 1/28]
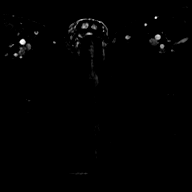

[Series 39: T1 · axial · 3.0mm · 1.15mm/px · 1 of 28 slices shown (30 of 48)]
[im 1/28]
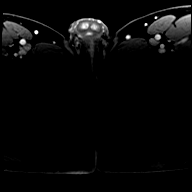

[Series 40: T1 · axial · 3.0mm · 1.15mm/px · 1 of 28 slices shown (31 of 48)]
[im 1/28]
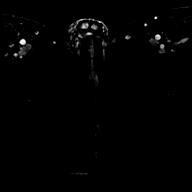

[Series 41: T1 · axial · 3.0mm · 1.15mm/px · 1 of 28 slices shown (32 of 48)]
[im 1/28]
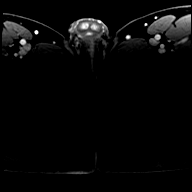

[Series 42: T1 · axial · 3.0mm · 1.15mm/px · 1 of 28 slices shown (33 of 48)]
[im 1/28]
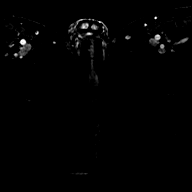

[Series 43: T1 · axial · 3.0mm · 1.15mm/px · 1 of 28 slices shown (34 of 48)]
[im 1/28]
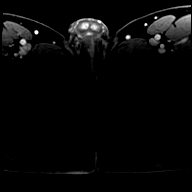

[Series 44: T1 · axial · 3.0mm · 1.15mm/px · 1 of 28 slices shown (35 of 48)]
[im 1/28]
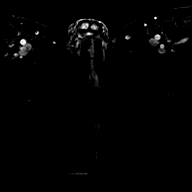

[Series 45: T1 · axial · 3.0mm · 1.15mm/px · 1 of 28 slices shown (36 of 48)]
[im 1/28]
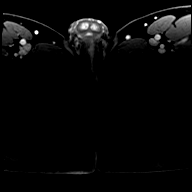

[Series 46: T1 · axial · 3.0mm · 1.15mm/px · 1 of 28 slices shown (37 of 48)]
[im 1/28]
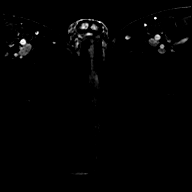

[Series 47: T1 · axial · 3.0mm · 1.15mm/px · 1 of 28 slices shown (38 of 48)]
[im 1/28]
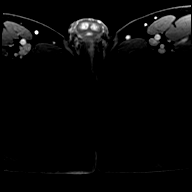

[Series 48: T1 · axial · 3.0mm · 1.15mm/px · 1 of 28 slices shown (39 of 48)]
[im 1/28]
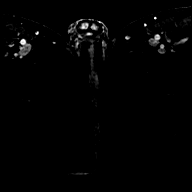

[Series 49: T1 · axial · 3.0mm · 1.15mm/px · 1 of 28 slices shown (40 of 48)]
[im 1/28]
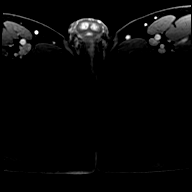

[Series 50: T1 · axial · 3.0mm · 1.15mm/px · 1 of 28 slices shown (41 of 48)]
[im 1/28]
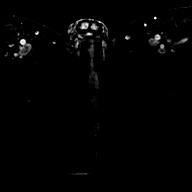

[Series 51: T1 · axial · 3.0mm · 1.15mm/px · 1 of 28 slices shown (42 of 48)]
[im 1/28]
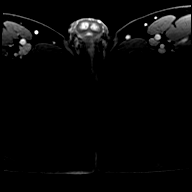

[Series 52: T1 · axial · 3.0mm · 1.15mm/px · 1 of 28 slices shown (43 of 48)]
[im 1/28]
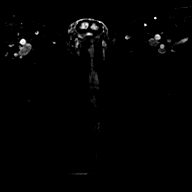

[Series 53: T1 · axial · 3.0mm · 1.15mm/px · 1 of 28 slices shown (44 of 48)]
[im 1/28]
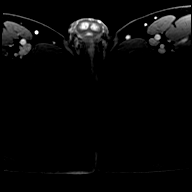

[Series 54: T1 · axial · 3.0mm · 1.15mm/px · 1 of 28 slices shown (45 of 48)]
[im 1/28]
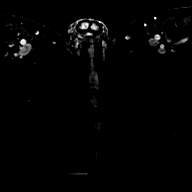

[Series 55: T1 · axial · 3.0mm · 1.15mm/px · 1 of 28 slices shown (46 of 48)]
[im 1/28]
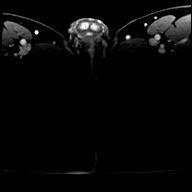

[Series 56: T1 · axial · 3.0mm · 1.15mm/px · 1 of 28 slices shown (47 of 48)]
[im 1/28]
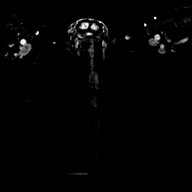

[Series 57: T1 · axial · 3.0mm · 1.15mm/px · 1 of 28 slices shown (48 of 48)]
[im 1/28]
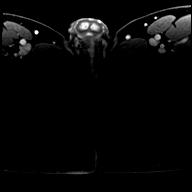

[56 of 56 positions shown; findings below may reference images not displayed]

FINDINGS: Prostate: Transitional zone: Changes of BPH in the transitional zone
with an area in the RIGHT paramidline anterior gland displaying
equivocal and mildly heterogeneous features on T2 weighted imaging
(image 38/9) area measuring up to 16 x 11 mm in the anterior RIGHT
paramidline mid gland, and with signs of mildly increased restricted
diffusion as compared to previous imaging, perhaps more conspicuous
on today's study than on previous exams. PIRADS category 4.

Peripheral zone: RIGHT posterolateral peripheral zone at the base
with hypointense features on T2 without substantial increased
restricted diffusion and without signs of arterial phase enhancement
but with mild heterogeneity on diffusion PIRADS category 3 (image
35/9).

Volume:

Transcapsular spread: Bulging of the anterior gland raises the
question of extracapsular extension. Of the anterior RIGHT
paramidline lesion.

Seminal vesicle involvement: Absent

Neurovascular bundle involvement: Absent

Pelvic adenopathy: Absent

Bone metastasis: Absent

Other findings: Small fat containing bilateral inguinal hernias.
IMPRESSION: PIRADS category 4 lesion in the RIGHT anterior paramidline mid
prostate within the transitional zone, mild bulging the capsule
raising the question of extracapsular extension.

LEFT posterolateral basilar PIRADS category 3 lesion.

No signs of adenopathy or bone lesion in the visible pelvis.

## 2021-10-26 MED ORDER — GADOBUTROL 1 MMOL/ML IV SOLN
7.0000 mL | Freq: Once | INTRAVENOUS | Status: AC | PRN
  Administered 2021-10-26: 7 mL via INTRAVENOUS

## 2021-10-27 ENCOUNTER — Encounter: Payer: Self-pay | Admitting: Family Medicine

## 2021-10-27 ENCOUNTER — Other Ambulatory Visit: Payer: Self-pay | Admitting: Urology

## 2021-10-27 DIAGNOSIS — R9389 Abnormal findings on diagnostic imaging of other specified body structures: Secondary | ICD-10-CM

## 2021-10-27 DIAGNOSIS — R972 Elevated prostate specific antigen [PSA]: Secondary | ICD-10-CM

## 2021-10-27 NOTE — Progress Notes (Unsigned)
Referral for fusion biopsy placed. ?

## 2021-11-23 DIAGNOSIS — C61 Malignant neoplasm of prostate: Secondary | ICD-10-CM | POA: Diagnosis not present

## 2021-11-23 DIAGNOSIS — R972 Elevated prostate specific antigen [PSA]: Secondary | ICD-10-CM | POA: Diagnosis not present

## 2021-11-26 ENCOUNTER — Other Ambulatory Visit: Payer: Self-pay | Admitting: Urology

## 2021-11-27 ENCOUNTER — Telehealth: Payer: Self-pay | Admitting: Urology

## 2021-11-27 NOTE — Telephone Encounter (Signed)
Spoke with patient and his wife concerning his prostate biopsy results and he is scheduled with Dr. Erlene Quan to discuss treatment options.

## 2021-11-30 ENCOUNTER — Encounter: Payer: Self-pay | Admitting: Family Medicine

## 2021-12-01 ENCOUNTER — Encounter: Payer: Self-pay | Admitting: Urology

## 2021-12-01 ENCOUNTER — Ambulatory Visit: Payer: BLUE CROSS/BLUE SHIELD | Admitting: Urology

## 2021-12-01 ENCOUNTER — Other Ambulatory Visit: Payer: Self-pay | Admitting: Urology

## 2021-12-01 ENCOUNTER — Encounter: Payer: Self-pay | Admitting: *Deleted

## 2021-12-01 VITALS — BP 126/76 | HR 71 | Ht 65.0 in | Wt 158.0 lb

## 2021-12-01 DIAGNOSIS — E291 Testicular hypofunction: Secondary | ICD-10-CM

## 2021-12-01 DIAGNOSIS — N401 Enlarged prostate with lower urinary tract symptoms: Secondary | ICD-10-CM | POA: Diagnosis not present

## 2021-12-01 DIAGNOSIS — C61 Malignant neoplasm of prostate: Secondary | ICD-10-CM | POA: Diagnosis not present

## 2021-12-01 DIAGNOSIS — N50812 Left testicular pain: Secondary | ICD-10-CM

## 2021-12-01 DIAGNOSIS — N138 Other obstructive and reflux uropathy: Secondary | ICD-10-CM | POA: Diagnosis not present
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
## 2021-12-01 NOTE — Progress Notes (Signed)
Surgical Physician Order Form [facility] [place]  * Scheduling expectation : Next Available  *Length of Case:   *Clearance needed: no  *Anticoagulation Instructions: Hold all anticoagulants  *Aspirin Instructions: Hold Aspirin  *Post-op visit Date/Instructions:   1 week Foley removal, 6 weeks follow-up with PSA  *Diagnosis: Prostate Cancer  *Procedure:  Radical robotic prostatectomy     Additional orders: N/A  -Admit type: Observation  -Anesthesia: General  -VTE Prophylaxis Standing Order SCD's       Other:   -Standing Lab Orders Per Anesthesia    Lab other:  UA/urine culture/BMP/CBC/INR/type and screen  -Standing Test orders EKG/Chest x-ray per Anesthesia       Test other:   - Medications:  Ancef 2gm IV- ok with allergy  -Other orders:  N/A

## 2021-12-03 ENCOUNTER — Telehealth: Payer: BLUE CROSS/BLUE SHIELD | Admitting: Urology

## 2021-12-03 ENCOUNTER — Ambulatory Visit: Payer: BLUE CROSS/BLUE SHIELD | Admitting: Family Medicine

## 2021-12-04 ENCOUNTER — Encounter: Payer: Self-pay | Admitting: Urology

## 2021-12-04 ENCOUNTER — Telehealth: Payer: Self-pay

## 2021-12-04 NOTE — Telephone Encounter (Signed)
Spoke with patient's spouse regarding symptoms. Patient has been having ongoing left testicle discomfort and burning but has been increasing. Advised to increase supportive underwear. Advised if unable to urinate or has worsening pain to seek medical attention. 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 Scheduled for Monday. Voiced understanding.

## 2021-12-04 NOTE — Telephone Encounter (Signed)
I spoke with Brian Wolfe and his wife Apolonio Schneiders. We have discussed possible surgery dates and Monday July 31st, 2023 was agreed upon by all parties. Patient given information about surgery date, what to expect pre-operatively and post operatively.  We discussed that a Pre-Admission Testing office will be calling to set up the pre-op visit that will take place prior to surgery, and that these appointments are typically done over the phone with a Pre-Admissions RN. Informed patient that our office will communicate any additional care to be provided after surgery. Patients questions or concerns were discussed during our call. Advised to call our office should there be any additional information, questions or concerns that arise. Patient verbalized understanding.

## 2021-12-04 NOTE — Progress Notes (Signed)
[facility] Posting Form   Surgery Date/Time: Date: 01/18/2022  Surgeon: Dr. [name], MD  Surgery Location: Day Surgery  Inpt ( No  )   Outpt (No)   Obs ( Yes  )   Diagnosis: C61 Prostate Cancer  -CPT: [phone]  Surgery: Laparoscopic Radical Robotic Prostatectomy  Stop Anticoagulations: Yes and also hold ASA  Cardiac/Medical/Pulmonary Clearance needed: no  *Orders entered into EPIC  Date: 12/04/21   *Case booked in EPIC  Date: 12/02/2021  *Notified pt of Surgery: Date: 12/02/2021  PRE-OP UA & CX: Yes, and also BMP, CBC, INR, TYPE AND SCREEN  *Placed into Prior Authorization Work Que Date: 12/04/21   Assistant/laser/rep:No

## 2021-12-07 ENCOUNTER — Ambulatory Visit: Payer: BLUE CROSS/BLUE SHIELD | Admitting: Urology

## 2021-12-07 ENCOUNTER — Other Ambulatory Visit: Payer: Self-pay

## 2021-12-07 ENCOUNTER — Encounter: Payer: Self-pay | Admitting: Urology

## 2021-12-07 ENCOUNTER — Other Ambulatory Visit
Admission: RE | Admit: 2021-12-07 | Discharge: 2021-12-07 | Disposition: A | Discharge status: Home / Self Care | Payer: BLUE CROSS/BLUE SHIELD | Attending: Urology | Admitting: Urology

## 2021-12-07 VITALS — BP 135/80 | HR 78 | Ht 65.0 in | Wt 154.0 lb

## 2021-12-07 DIAGNOSIS — N401 Enlarged prostate with lower urinary tract symptoms: Secondary | ICD-10-CM | POA: Diagnosis not present

## 2021-12-07 DIAGNOSIS — N50819 Testicular pain, unspecified: Secondary | ICD-10-CM | POA: Insufficient documentation

## 2021-12-07 DIAGNOSIS — N453 Epididymo-orchitis: Secondary | ICD-10-CM

## 2021-12-07 DIAGNOSIS — N138 Other obstructive and reflux uropathy: Secondary | ICD-10-CM

## 2021-12-07 LAB — URINALYSIS, COMPLETE (UACMP) WITH MICROSCOPIC
Bilirubin Urine: NEGATIVE
Glucose, UA: >1000 mg/dL — AB
Nitrite: NEGATIVE
Protein, ur: 30 mg/dL — AB
Specific Gravity, Urine: 1.015 (ref 1.005–1.030)
pH: 6 (ref 5.0–8.0)

## 2021-12-07 LAB — BLADDER SCAN AMB NON-IMAGING: Scan Result: 0

## 2021-12-07 MED ORDER — IBUPROFEN 800 MG PO TABS: 800.0000 mg | ORAL_TABLET | Freq: Three times a day (TID) | ORAL | 0 refills | Status: DC | PRN

## 2021-12-07 MED ORDER — CIPROFLOXACIN HCL 500 MG PO TABS: 500.0000 mg | ORAL_TABLET | Freq: Two times a day (BID) | ORAL | 0 refills | Status: DC

## 2021-12-07 NOTE — Patient Instructions (Addendum)
[phone] 
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
## 2021-12-08 ENCOUNTER — Other Ambulatory Visit: Payer: Self-pay | Admitting: Acute Care

## 2021-12-08 ENCOUNTER — Ambulatory Visit
Admission: RE | Admit: 2021-12-08 | Discharge: 2021-12-08 | Disposition: A | Discharge status: Home / Self Care | Payer: BLUE CROSS/BLUE SHIELD | Source: Ambulatory Visit | Attending: Acute Care | Admitting: Acute Care

## 2021-12-08 DIAGNOSIS — R918 Other nonspecific abnormal finding of lung field: Secondary | ICD-10-CM

## 2021-12-08 DIAGNOSIS — F17210 Nicotine dependence, cigarettes, uncomplicated: Secondary | ICD-10-CM

## 2021-12-09 ENCOUNTER — Telehealth: Payer: Self-pay

## 2021-12-09 LAB — URINE CULTURE: Culture: >=100000 — AB

## 2021-12-09 NOTE — Telephone Encounter (Signed)
error 

## 2021-12-09 NOTE — Telephone Encounter (Signed)
Pt notified via mychart

## 2021-12-09 NOTE — Telephone Encounter (Signed)
-----   Message from Nori Riis, PA-C sent at 12/09/2021  9:20 AM EDT ----- Please let Mr. Lucus know that his urine culture was positive for infection at the Cipro is the appropriate antibiotic I hope he is feeling better.  We will see him as scheduled in July.

## 2021-12-10 ENCOUNTER — Encounter: Payer: Self-pay | Admitting: Urology

## 2021-12-14 ENCOUNTER — Ambulatory Visit: Payer: BLUE CROSS/BLUE SHIELD

## 2021-12-21 ENCOUNTER — Ambulatory Visit: Payer: BLUE CROSS/BLUE SHIELD | Admitting: Urology

## 2021-12-21 ENCOUNTER — Other Ambulatory Visit
Admission: RE | Admit: 2021-12-21 | Discharge: 2021-12-21 | Disposition: A | Discharge status: Home / Self Care | Payer: BC Managed Care – PPO | Attending: Urology | Admitting: Urology

## 2021-12-21 ENCOUNTER — Other Ambulatory Visit: Payer: Self-pay

## 2021-12-21 ENCOUNTER — Encounter: Payer: Self-pay | Admitting: Urology

## 2021-12-21 VITALS — BP 123/75 | HR 65 | Ht 65.0 in | Wt 152.6 lb

## 2021-12-21 DIAGNOSIS — N453 Epididymo-orchitis: Secondary | ICD-10-CM

## 2021-12-21 DIAGNOSIS — N50819 Testicular pain, unspecified: Secondary | ICD-10-CM | POA: Diagnosis present

## 2021-12-21 LAB — URINALYSIS, COMPLETE (UACMP) WITH MICROSCOPIC
Bacteria, UA: NONE SEEN
Bilirubin Urine: NEGATIVE
Glucose, UA: 500 mg/dL — AB
Hgb urine dipstick: NEGATIVE
Leukocytes,Ua: NEGATIVE
Nitrite: NEGATIVE
Protein, ur: NEGATIVE mg/dL
RBC / HPF: NONE SEEN RBC/hpf (ref 0–5)
Specific Gravity, Urine: 1.025 (ref 1.005–1.030)
Squamous Epithelial / HPF: NONE SEEN (ref 0–5)
pH: 5 (ref 5.0–8.0)
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
## 2021-12-23 LAB — URINE CULTURE: Culture: NO GROWTH

## 2021-12-28 ENCOUNTER — Ambulatory Visit: Payer: BC Managed Care – PPO | Admitting: Physical Therapy

## 2022-01-04 ENCOUNTER — Encounter: Payer: Self-pay | Admitting: Family Medicine

## 2022-01-04 ENCOUNTER — Ambulatory Visit: Payer: BC Managed Care – PPO | Admitting: Physical Therapy

## 2022-01-04 ENCOUNTER — Ambulatory Visit: Payer: BLUE CROSS/BLUE SHIELD | Admitting: Family Medicine

## 2022-01-04 VITALS — BP 123/75 | HR 80 | Ht 65.0 in | Wt 152.2 lb

## 2022-01-04 DIAGNOSIS — N4231 Prostatic intraepithelial neoplasia: Secondary | ICD-10-CM | POA: Diagnosis not present

## 2022-01-04 DIAGNOSIS — E291 Testicular hypofunction: Secondary | ICD-10-CM | POA: Diagnosis not present

## 2022-01-04 DIAGNOSIS — E538 Deficiency of other specified B group vitamins: Secondary | ICD-10-CM | POA: Diagnosis not present

## 2022-01-04 DIAGNOSIS — E1169 Type 2 diabetes mellitus with other specified complication: Secondary | ICD-10-CM | POA: Diagnosis not present

## 2022-01-04 DIAGNOSIS — E559 Vitamin D deficiency, unspecified: Secondary | ICD-10-CM

## 2022-01-04 DIAGNOSIS — E782 Mixed hyperlipidemia: Secondary | ICD-10-CM

## 2022-01-04 LAB — POCT GLYCOSYLATED HEMOGLOBIN (HGB A1C): Hemoglobin A1C: 6.2 % — AB (ref 4.0–5.6)

## 2022-01-04 NOTE — Assessment & Plan Note (Signed)
Working w Urology, upcoming surgery scheduled Remains off testosterone for now, he has hypogonadism and concerned about energy fatigue off med

## 2022-01-04 NOTE — Assessment & Plan Note (Signed)
Followed by Urology Off Testosterone due to prostate cancer / surgery  May add Boost if you like and One A Day Mens 50+ MVI  Checking labs today for Vitamin B12 D, Thyroid, Blood Counts.

## 2022-01-04 NOTE — Assessment & Plan Note (Signed)
Well-controlled DM with A1c 6.2 down from prior 8-9 range Complications - hyperlipidemia, GERD GI upset on higher doses of metformin, hypoglycemia on higher ozempic  Plan:  1. Continue current therapy - Ozempic 0.'25mg'$  weekly low dose only, Metformin XR '750mg'$  daily 2. Encourage improved lifestyle - low carb, low sugar diet, reduce portion size, continue improving regular exercise 3. Check CBG , bring log to next visit for review 4. Continue Statin  Gave sample of CGM DexcomG7 trial but does not meet criteria at this time. He has had history of hypoglycemia, but not at this time.

## 2022-01-04 NOTE — Patient Instructions (Addendum)
Thank you for coming to the office today.  Stay tuned for lab results.  Recent Labs    09/03/21 0901 10/01/21 0855 01/04/22 0939  HGBA1C 9.1* 7.8* 6.2*   Ozempic 0.'25mg'$  weekly Metformin XR '750mg'$  x 1 daily  Keep up the good work.  May add Boost if you like and One A Day Mens 50+ MVI  Checking labs today for Vitamin B12 D, Thyroid, Blood Counts.  AFTER / DUE for FASTING BLOOD WORK (no food or drink after midnight before the lab appointment, only water or coffee without cream/sugar on the morning of)  - Make sure Lab Only appointment is at about 1 week before your next appointment, so that results will be available  For Lab Results, once available within 2-3 days of blood draw, you can can log in to MyChart online to view your results and a brief explanation. Also, we can discuss results at next follow-up visit.   Please schedule a Follow-up Appointment to: Return in about 3 months (around 04/06/2022) for 3 month Annual Physical AM apt fasting lab AFTER.  If you have any other questions or concerns, please feel free to call the office or send a message through [facility]. You may also schedule an earlier appointment if necessary.  Additionally, you may be receiving a survey about your experience at our office within a few days to 1 week by e-mail or mail. We value your feedback.  [name], DO [place]

## 2022-01-04 NOTE — Progress Notes (Signed)
Subjective:    Patient ID: Brian Wolfe, male    DOB: 10-26-1960, 61 y.o.   MRN: [id number]  Guss Farruggia is a 61 y.o. male presenting on 01/04/2022 for Diabetes   HPI  Type 2 Diabetes Improved A1c 7.8 down to 6.2 in interval history past 3 months Now improved appetite feels more balanced. Gas has reduced and GI intolerance now off higher dose metformin, and now further low sugars Current meds Ozempic 0.'25mg'$  weekly Metformin XR '750mg'$  x 1 daily   Hypogonadism Followed by Urology Previously on Testosterone, now cannot take due to energy. Difficulty with reduced energy Previously Testosterone 348 range 09/2021 W Fatigue  Prostate Cancer S/p upcoming prostatectomy robotic, scheduled      10/19/2021   10:20 AM 09/03/2021    8:59 AM 03/13/2021    8:02 AM  Depression screen PHQ 2/9  Decreased Interest '1 1 1  '$ Down, Depressed, Hopeless 1 0 0  PHQ - 2 Score '2 1 1  '$ Altered sleeping 0 3 1  Tired, decreased energy '2 2 2  '$ Change in appetite 2 0 0  Feeling bad or failure about yourself  0 1 1  Trouble concentrating 0 0 0  Moving slowly or fidgety/restless 0 1 0  Suicidal thoughts 0 0 0  PHQ-9 Score '6 8 5  '$ Difficult doing work/chores Not difficult at all Not difficult at all Not difficult at all    Social History   Tobacco Use   Smoking status: Every Day    Packs/day: 1.00    Years: 40.00    Total pack years: 40.00    Types: Cigarettes   Smokeless tobacco: Never  Vaping Use   Vaping Use: Never used  Substance Use Topics   Alcohol use: No    Alcohol/week: 0.0 standard drinks of alcohol   Drug use: No    Review of Systems Per HPI unless specifically indicated above     Objective:    BP 123/75 (BP Location: Left Arm, Patient Position: Sitting, Cuff Size: Normal)   Pulse 80   Ht '5\' 5"'$  (1.651 m)   Wt 152 lb 3.2 oz (69 kg)   BMI 25.33 kg/m   Wt Readings from Last 3 Encounters:  01/04/22 152 lb 3.2 oz (69 kg)  12/21/21 152 lb 9.6 oz (69.2 kg)  12/07/21 154  lb (69.9 kg)    Physical Exam Vitals and nursing note reviewed.  Constitutional:      General: He is not in acute distress.    Appearance: He is well-developed. He is not diaphoretic.     Comments: Well-appearing, comfortable, cooperative  HENT:     Head: Normocephalic and atraumatic.  Eyes:     General:        Right eye: No discharge.        Left eye: No discharge.     Conjunctiva/sclera: Conjunctivae normal.  Neck:     Thyroid: No thyromegaly.  Cardiovascular:     Rate and Rhythm: Normal rate and regular rhythm.     Pulses: Normal pulses.     Heart sounds: Normal heart sounds. No murmur heard. Pulmonary:     Effort: Pulmonary effort is normal. No respiratory distress.     Breath sounds: Normal breath sounds. No wheezing or rales.  Musculoskeletal:        General: Normal range of motion.     Cervical back: Normal range of motion and neck supple.  Lymphadenopathy:     Cervical: No cervical adenopathy.  Skin:    General: Skin is warm and dry.     Findings: No erythema or rash.  Neurological:     Mental Status: He is alert and oriented to person, place, and time. Mental status is at baseline.  Psychiatric:        Behavior: Behavior normal.     Comments: Well groomed, good eye contact, normal speech and thoughts    Results for orders placed or performed in visit on 01/04/22  POCT HgB A1C  Result Value Ref Range   Hemoglobin A1C 6.2 (A) 4.0 - 5.6 %      Assessment & Plan:   Problem List Items Addressed This Visit     Type 2 diabetes mellitus with other specified complication ([place]) - Primary    Well-controlled DM with A1c 6.2 down from prior 8-9 range Complications - hyperlipidemia, GERD GI upset on higher doses of metformin, hypoglycemia on higher ozempic  Plan:  1. Continue current therapy - Ozempic 0.'25mg'$  weekly low dose only, Metformin XR '750mg'$  daily 2. Encourage improved lifestyle - low carb, low sugar diet, reduce portion size, continue improving regular  exercise 3. Check CBG , bring log to next visit for review 4. Continue Statin  Gave sample of CGM DexcomG7 trial but does not meet criteria at this time. He has had history of hypoglycemia, but not at this time.      Relevant Medications   OZEMPIC, 0.25 OR 0.5 MG/DOSE, 2 MG/1.5ML SOPN   Other Relevant Orders   POCT HgB A1C (Completed)   Hypogonadism in male    Followed by Urology Off Testosterone due to prostate cancer / surgery  May add Boost if you like and One A Day Mens 50+ MVI  Checking labs today for Vitamin B12 D, Thyroid, Blood Counts.      Relevant Orders   TSH   T4, free   High grade prostatic intraepithelial neoplasia    Working w Urology, upcoming surgery scheduled Remains off testosterone for now, he has hypogonadism and concerned about energy fatigue off med      Relevant Orders   CBC with Differential/Platelet   Other Visit Diagnoses     Vitamin B12 deficiency       Relevant Orders   Vitamin B12   Vitamin D deficiency       Relevant Orders   VITAMIN D 25 Hydroxy (Vit-D Deficiency, Fractures)   Mixed hyperlipidemia       Relevant Orders   TSH   T4, free       No orders of the defined types were placed in this encounter.   Orders Placed This Encounter  Procedures   CBC with Differential/Platelet   Vitamin B12   VITAMIN D 25 Hydroxy (Vit-D Deficiency, Fractures)   TSH   T4, free   POCT HgB A1C     Follow up plan: Return in about 3 months (around 04/06/2022) for 3 month Annual Physical AM apt fasting lab AFTER.   [name], [facility] 01/04/2022, 9:38 AM

## 2022-01-05 LAB — CBC WITH DIFFERENTIAL/PLATELET
Absolute Monocytes: 440 cells/uL (ref 200–950)
Basophils Absolute: 50 cells/uL (ref 0–200)
Basophils Relative: 0.8 %
Eosinophils Absolute: 341 cells/uL (ref 15–500)
Eosinophils Relative: 5.5 %
HCT: 40.3 % (ref 38.5–50.0)
Hemoglobin: 13.7 g/dL (ref 13.2–17.1)
Lymphs Abs: 2337 cells/uL (ref 850–3900)
MCH: 31.7 pg (ref 27.0–33.0)
MCHC: 34 g/dL (ref 32.0–36.0)
MCV: 93.3 fL (ref 80.0–100.0)
MPV: 11.5 fL (ref 7.5–12.5)
Monocytes Relative: 7.1 %
Neutro Abs: 3032 cells/uL (ref 1500–7800)
Neutrophils Relative %: 48.9 %
Platelets: 163 10*3/uL (ref 140–400)
RBC: 4.32 10*6/uL (ref 4.20–5.80)
RDW: 12.9 % (ref 11.0–15.0)
Total Lymphocyte: 37.7 %
WBC: 6.2 10*3/uL (ref 3.8–10.8)

## 2022-01-05 LAB — T4, FREE: Free T4: 0.8 ng/dL (ref 0.8–1.8)

## 2022-01-05 LAB — VITAMIN B12: Vitamin B-12: 389 pg/mL (ref 200–1100)

## 2022-01-05 LAB — VITAMIN D 25 HYDROXY (VIT D DEFICIENCY, FRACTURES): Vit D, 25-Hydroxy: 27 ng/mL — ABNORMAL LOW (ref 30–100)

## 2022-01-05 LAB — TSH: TSH: 0.46 mIU/L (ref 0.40–4.50)

## 2022-01-08 ENCOUNTER — Encounter
Admission: RE | Admit: 2022-01-08 | Discharge: 2022-01-08 | Disposition: A | Discharge status: Home / Self Care | Payer: BC Managed Care – PPO | Source: Ambulatory Visit | Attending: Urology | Admitting: Urology

## 2022-01-08 ENCOUNTER — Telehealth: Payer: Self-pay | Admitting: Family Medicine

## 2022-01-08 ENCOUNTER — Telehealth: Payer: Self-pay

## 2022-01-08 ENCOUNTER — Other Ambulatory Visit: Payer: Self-pay

## 2022-01-08 ENCOUNTER — Encounter: Payer: Self-pay | Admitting: Family Medicine

## 2022-01-08 DIAGNOSIS — E1169 Type 2 diabetes mellitus with other specified complication: Secondary | ICD-10-CM

## 2022-01-08 DIAGNOSIS — Z720 Tobacco use: Secondary | ICD-10-CM

## 2022-01-08 HISTORY — DX: Chronic obstructive pulmonary disease, unspecified: J44.9

## 2022-01-08 HISTORY — DX: Type 2 diabetes mellitus without complications: E11.9

## 2022-01-08 NOTE — Telephone Encounter (Signed)
Please notify patient  That is fine. This is a new issue that I am now aware of.  Patients taking ozempic or similar medications may stop it 1-2 weeks before surgery to avoid retained food in stomach during anesthesia.  It is okay if he stops the ozempic 1 week prior to surgery, skip one dose basically.  [name], [facility] 01/08/2022, 2:46 PM

## 2022-01-08 NOTE — Telephone Encounter (Signed)
I spoke with his wife and told her to skip next weekends dose of Ozempic and to restart the following week.

## 2022-01-08 NOTE — Progress Notes (Signed)
  Perioperative Services Pre-Admission/Anesthesia Testing    Date: 01/08/22  Name: Brian Wolfe MRN:   [id number]  Re: GLP-1 clearance and provider recommendations   Planned Surgical Procedure(s):    Case: [id number] Date/Time: 01/18/22 1105   Procedure: XI ROBOTIC ASSISTED LAPAROSCOPIC RADICAL PROSTATECTOMY   Anesthesia type: General   Pre-op diagnosis: Prostate Cancer   Location: [facility] OR ROOM 07 / [facility]   Surgeons: [name], MD   Clinical Notes:  Patient is scheduled for the above procedure on 01/18/2022 with Dr. [name], MD. In review of his medication reconciliation it was noted that patient is on a prescribed GLP-1 medication (semaglutide). Per guidelines issued by the American Society of Anesthesiologists (ASA), it is recommended that these medications be held for 7 days prior to the patient undergoing any type of elective surgical procedure.   PAT staff reached out to prescribing provider Parks Ranger, MD) to make them aware of the guidelines from anesthesia. Given that this patient takes semaglutide for his diabetes diagnosis rather than for weight loss, recommendations from the prescribing provider were solicited. Prescribing provider made aware of the following so that informed decision/POC can be developed for their patient that may be taking medications belonging to these drug classes:  Oral GLP-1 medications will be held 1 day prior to surgery.  Injectable GLP-1 medications will be held 7 days prior to surgery.  Metformin is routinely held 48 hours prior to surgery due to renal concerns, potential need for contrasted imaging perioperatively, and the potential for tissue hypoxia leading to drug induced lactic acidosis.  All SGLT2i medications are held 72 hours prior to surgery as they can be associated with the increased potential for developing euglycemic diabetic ketoacidosis (EDKA).   Impression and Plan:  Brian Wolfe is on a  prescribed GLP-1 medication, which induces the known side effect of decreased gastric emptying. Efforts are bring made to mitigate the risk of perioperative hyperglycemic events, as elevated blood glucose levels have been found to contribute to intra/postoperative complications. Additionally, hyperglycemic extremes can potentially necessitate the postponing of a patient's elective case in order to better optimize perioperative glycemic control, again with the aforementioned guidelines in place. With this in mind, recommendations have been sought from the prescribing provider, who has cleared patient to proceed with holding the prescribed GLP-1 as per the guidelines from the ASA. Provider recommending: no further recommendations or changes to current regimen.   Honor Loh, MSN, APRN, FNP-C, CEN [facility]  Peri-operative Services Nurse Practitioner Phone: [phone] 01/08/22 4:26 PM  NOTE: This note has been prepared using Dragon dictation software. Despite my best ability to proofread, there is always the potential that unintentional transcriptional errors may still occur from this process.

## 2022-01-08 NOTE — Patient Instructions (Addendum)
Your procedure is scheduled on: 01/18/2022 Report to the Registration Desk on the 1st floor of the [facility]. To find out your arrival time, please call [phone] between 1PM - 3PM on: 7/28 /2023  If your arrival time is 6:00 am, do not arrive prior to that time as the [place] entrance doors do not open until 6:00 am.  REMEMBER: Instructions that are not followed completely may result in serious medical risk, up to and including death; or upon the discretion of your surgeon and anesthesiologist your surgery may need to be rescheduled.  Do not eat food after midnight the night before surgery.  No gum chewing, lozengers or hard candies.   TAKE THESE MEDICATIONS THE MORNING OF SURGERY WITH A SIP OF WATER: Lexapro Lipitor Omeprazole -(take one the night before and one on the morning of surgery - helps to prevent nausea after surgery.)  Use Anoro INHALER  at home on the day of surgery and bring the albuterol to the hospital.    ** Follow new guidelines for insulin and diabetes medications.**  Ozempic- Hold weekly dose 7 days prior to procedure. Resume medication the day after surgery.  We will reach out to your doctor  for recommendation and we will notify you. Otherwise please adhere to the instructions.    HOLD METFORMIN TWO DAYS PRIOR TO SURGERY - Per Pre -Anesthesia team.     One week prior to surgery: Stop Anti-inflammatories (NSAIDS) such as Advil, Aleve, Ibuprofen, Motrin, Naproxen, Naprosyn and Aspirin based products such as Excedrin, Goodys Powder, BC Powder. Stop ANY OVER THE COUNTER supplements until after surgery. CO --ENZYME,and  MULTIVITAMINS You may however, continue to take Tylenol if needed for pain up until the day of surgery.  No Alcohol for 24 hours before or after surgery.  No Smoking including e-cigarettes for 24 hours prior to surgery.  No chewable tobacco products for at least 6 hours prior to surgery.  No nicotine patches on the day of surgery.  Do  not use any "recreational" drugs for at least a week prior to your surgery.  Please be advised that the combination of cocaine and anesthesia may have negative outcomes, up to and including death. If you test positive for cocaine, your surgery will be cancelled.  On the morning of surgery brush your teeth with toothpaste and water, you may rinse your mouth with mouthwash if you wish. Do not swallow any toothpaste or mouthwash.  Use CHG Soap  as directed on instruction sheet.-PROVIDED FOR YOU   Do not wear jewelry, make-up, hairpins, clips or nail polish.  Do not wear lotions, powders, or perfumes OR  DEODORANT  Do not shave body from the neck down 48 hours prior to surgery just in case you cut yourself which could leave a site for infection.  Also, freshly shaved skin may become irritated if using the CHG soap.  Contact lenses, hearing aids and dentures may not be worn into surgery.  Do not bring valuables to the hospital. [facility] is not responsible for any missing/lost belongings or valuables.    Notify your doctor if there is any change in your medical condition (cold, fever, infection).  Wear comfortable clothing (specific to your surgery type) to the hospital.  After surgery, you can help prevent lung complications by doing breathing exercises.  Take deep breaths and cough every 1-2 hours. Your doctor may order a device called an Incentive Spirometer to help you take deep breaths. If you are being admitted to  the hospital overnight, leave your suitcase in the car. After surgery it may be brought to your room.  If you are being discharged the day of surgery, you will not be allowed to drive home. You will need a responsible adult (18 years or older) to drive you home and stay with you that night.   If you are taking public transportation, you will need to have a responsible adult (18 years or older) with you. Please confirm with your physician that it is acceptable to use  public transportation.   Please call the [facility]. at [phone] if you have any questions about these instructions.  Surgery Visitation Policy:  Patients undergoing a surgery or procedure may have two family members or support persons with them as long as the person is not COVID-19 positive or experiencing its symptoms.   Inpatient Visitation:    Visiting hours are 7 a.m. to 8 p.m. Up to four visitors are allowed at one time in a patient room, including children. The visitors may rotate out with other people during the day. One designated support person (adult) may remain overnight.

## 2022-01-08 NOTE — Telephone Encounter (Signed)
Verdis Prime calling Preadmission testing [facility] is calling to verify information. Surgery date 01/18/22. A fax was sent for approval to stop the ozempic 7 days before [phone]

## 2022-01-08 NOTE — Telephone Encounter (Signed)
Copied from [place] [phone]. Topic: General - Other >> Jan 08, 2022 12:22 PM Leitha Schuller wrote: Reason for CRM: Rep w/ [facility] pre testing admissions states patient will have surgery on 01-18-2022, Rep requesting a cb to discuss if patient can stop taking  ozempic due to the anesthesia   Rep states a request will also be fax to provider  Please assist further

## 2022-01-08 NOTE — Progress Notes (Signed)
Permission Form  Request to hold Ozempic  was faxed to Dr. Parks Ranger office ([place] office) on Friday, 01/08/2022 . Patient was instructed to hold the  weekly dose 7 days prior to surgery pending approval from appropriate provider. Instructed patient to receive a call from [place] to confirm the instruction or give [place] a call for questions.

## 2022-01-11 ENCOUNTER — Encounter: Payer: Self-pay | Admitting: Urgent Care

## 2022-01-11 ENCOUNTER — Encounter: Payer: Self-pay | Admitting: Physical Therapy

## 2022-01-11 ENCOUNTER — Ambulatory Visit: Payer: BC Managed Care – PPO | Attending: Urology | Admitting: Physical Therapy

## 2022-01-11 ENCOUNTER — Encounter
Admission: RE | Admit: 2022-01-11 | Discharge: 2022-01-11 | Disposition: A | Discharge status: Home / Self Care | Payer: BC Managed Care – PPO | Source: Ambulatory Visit | Attending: Urology | Admitting: Urology

## 2022-01-11 DIAGNOSIS — C61 Malignant neoplasm of prostate: Secondary | ICD-10-CM | POA: Insufficient documentation

## 2022-01-11 DIAGNOSIS — M6281 Muscle weakness (generalized): Secondary | ICD-10-CM | POA: Diagnosis present

## 2022-01-11 DIAGNOSIS — Z720 Tobacco use: Secondary | ICD-10-CM | POA: Insufficient documentation

## 2022-01-11 DIAGNOSIS — N401 Enlarged prostate with lower urinary tract symptoms: Secondary | ICD-10-CM | POA: Diagnosis not present

## 2022-01-11 DIAGNOSIS — R278 Other lack of coordination: Secondary | ICD-10-CM | POA: Diagnosis present

## 2022-01-11 DIAGNOSIS — Z01818 Encounter for other preprocedural examination: Secondary | ICD-10-CM | POA: Insufficient documentation

## 2022-01-11 DIAGNOSIS — R29898 Other symptoms and signs involving the musculoskeletal system: Secondary | ICD-10-CM | POA: Diagnosis present

## 2022-01-11 DIAGNOSIS — N138 Other obstructive and reflux uropathy: Secondary | ICD-10-CM | POA: Diagnosis not present

## 2022-01-11 LAB — BASIC METABOLIC PANEL
Anion gap: 9 (ref 5–15)
BUN: 15 mg/dL (ref 6–20)
CO2: 26 mmol/L (ref 22–32)
Calcium: 9.6 mg/dL (ref 8.9–10.3)
Chloride: 103 mmol/L (ref 98–111)
Creatinine, Ser: 0.77 mg/dL (ref 0.61–1.24)
GFR, Estimated: >60 mL/min (ref >60)
Glucose, Bld: 174 mg/dL — ABNORMAL HIGH (ref 70–99)
Potassium: 3.9 mmol/L (ref 3.5–5.1)
Sodium: 138 mmol/L (ref 135–145)

## 2022-01-11 LAB — CBC
HCT: 39.8 % (ref 39.0–52.0)
Hemoglobin: 13.4 g/dL (ref 13.0–17.0)
MCH: 31.7 pg (ref 26.0–34.0)
MCHC: 33.7 g/dL (ref 30.0–36.0)
MCV: 94.1 fL (ref 80.0–100.0)
Platelets: 146 10*3/uL — ABNORMAL LOW (ref 150–400)
RBC: 4.23 MIL/uL (ref 4.22–5.81)
RDW: 13.3 % (ref 11.5–15.5)
WBC: 6.8 10*3/uL (ref 4.0–10.5)
nRBC: 0 % (ref 0.0–0.2)

## 2022-01-11 LAB — URINALYSIS, COMPLETE (UACMP) WITH MICROSCOPIC
Bacteria, UA: NONE SEEN
Bilirubin Urine: NEGATIVE
Glucose, UA: 50 mg/dL — AB
Hgb urine dipstick: NEGATIVE
Ketones, ur: NEGATIVE mg/dL
Leukocytes,Ua: NEGATIVE
Nitrite: NEGATIVE
Protein, ur: NEGATIVE mg/dL
Specific Gravity, Urine: 1.008 (ref 1.005–1.030)
Squamous Epithelial / HPF: NONE SEEN (ref 0–5)
WBC, UA: NONE SEEN WBC/hpf (ref 0–5)
pH: 6 (ref 5.0–8.0)

## 2022-01-11 LAB — TYPE AND SCREEN
ABO/RH(D): A POS
Antibody Screen: NEGATIVE

## 2022-01-11 LAB — PROTIME-INR
INR: 0.9 (ref 0.8–1.2)
Prothrombin Time: 12.4 seconds (ref 11.4–15.2)
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
## 2022-01-11 NOTE — Telephone Encounter (Signed)
We received fax from pre admit about the clearance. Once Dr. Raliegh Ip signs it, I will fax it to them.

## 2022-01-12 LAB — URINE CULTURE: Culture: NO GROWTH

## 2022-01-18 ENCOUNTER — Ambulatory Visit: Payer: BC Managed Care – PPO | Admitting: Urgent Care

## 2022-01-18 ENCOUNTER — Other Ambulatory Visit: Payer: Self-pay

## 2022-01-18 ENCOUNTER — Observation Stay
Admission: RE | Admit: 2022-01-18 | Discharge: 2022-01-19 | Disposition: A | Discharge status: Home / Self Care | Payer: BC Managed Care – PPO | Source: Ambulatory Visit | Attending: Urology | Admitting: Urology

## 2022-01-18 ENCOUNTER — Encounter
Admission: RE | Disposition: A | Discharge status: Home / Self Care | Payer: Self-pay | Source: Ambulatory Visit | Attending: Urology

## 2022-01-18 ENCOUNTER — Encounter: Payer: Self-pay | Admitting: Urology

## 2022-01-18 DIAGNOSIS — F17210 Nicotine dependence, cigarettes, uncomplicated: Secondary | ICD-10-CM | POA: Insufficient documentation

## 2022-01-18 DIAGNOSIS — E291 Testicular hypofunction: Secondary | ICD-10-CM | POA: Diagnosis not present

## 2022-01-18 DIAGNOSIS — E1169 Type 2 diabetes mellitus with other specified complication: Secondary | ICD-10-CM

## 2022-01-18 DIAGNOSIS — E119 Type 2 diabetes mellitus without complications: Secondary | ICD-10-CM | POA: Diagnosis not present

## 2022-01-18 DIAGNOSIS — C61 Malignant neoplasm of prostate: Secondary | ICD-10-CM | POA: Diagnosis not present

## 2022-01-18 DIAGNOSIS — N453 Epididymo-orchitis: Secondary | ICD-10-CM | POA: Insufficient documentation

## 2022-01-18 HISTORY — PX: ROBOT ASSISTED LAPAROSCOPIC RADICAL PROSTATECTOMY: SHX5141

## 2022-01-18 LAB — GLUCOSE, CAPILLARY
Glucose-Capillary: 119 mg/dL — ABNORMAL HIGH (ref 70–99)
Glucose-Capillary: 239 mg/dL — ABNORMAL HIGH (ref 70–99)
Glucose-Capillary: 228 mg/dL — ABNORMAL HIGH (ref 70–99)
Glucose-Capillary: 196 mg/dL — ABNORMAL HIGH (ref 70–99)

## 2022-01-18 LAB — ABO/RH: ABO/RH(D): A POS

## 2022-01-18 SURGERY — PROSTATECTOMY, RADICAL, ROBOT-ASSISTED, LAPAROSCOPIC
Anesthesia: General | Site: Abdomen

## 2022-01-18 MED ORDER — OXYCODONE-ACETAMINOPHEN 5-325 MG PO TABS
ORAL_TABLET | ORAL | Status: AC
  Filled 2022-01-18: qty 1

## 2022-01-18 MED ORDER — HYDROMORPHONE HCL 1 MG/ML IJ SOLN
INTRAMUSCULAR | Status: AC
  Filled 2022-01-18: qty 1

## 2022-01-18 MED ORDER — HEPARIN SODIUM (PORCINE) 5000 UNIT/ML IJ SOLN
5000.0000 [IU] | Freq: Three times a day (TID) | INTRAMUSCULAR | Status: DC
Start: 2022-01-18 — End: 2022-01-19
  Filled 2022-01-18: qty 1

## 2022-01-18 MED ORDER — ACETAMINOPHEN 325 MG PO TABS: 650.0000 mg | ORAL_TABLET | ORAL | Status: DC | PRN

## 2022-01-18 MED ORDER — BUPIVACAINE HCL (PF) 0.5 % IJ SOLN
INTRAMUSCULAR | Status: AC
  Filled 2022-01-18: qty 30

## 2022-01-18 MED ORDER — ORAL CARE MOUTH RINSE: 15.0000 mL | OROMUCOSAL | Status: DC | PRN

## 2022-01-18 MED ORDER — STERILE WATER FOR IRRIGATION IR SOLN
Status: DC | PRN
  Administered 2022-01-18: 500 mL

## 2022-01-18 MED ORDER — SUGAMMADEX SODIUM 200 MG/2ML IV SOLN
INTRAVENOUS | Status: DC | PRN
  Administered 2022-01-18: 200 mg via INTRAVENOUS

## 2022-01-18 MED ORDER — LIDOCAINE HCL (PF) 2 % IJ SOLN
INTRAMUSCULAR | Status: AC
  Filled 2022-01-18: qty 5

## 2022-01-18 MED ORDER — OXYCODONE HCL 5 MG PO TABS
5.0000 mg | ORAL_TABLET | Freq: Once | ORAL | Status: AC
  Administered 2022-01-18: 5 mg via ORAL

## 2022-01-18 MED ORDER — CEFAZOLIN SODIUM-DEXTROSE 2-4 GM/100ML-% IV SOLN
2.0000 g | INTRAVENOUS | Status: AC
  Administered 2022-01-18: 2 g via INTRAVENOUS

## 2022-01-18 MED ORDER — OXYCODONE-ACETAMINOPHEN 5-325 MG PO TABS
1.0000 | ORAL_TABLET | ORAL | Status: DC | PRN
  Administered 2022-01-18 – 2022-01-19 (×3): 1 via ORAL
  Filled 2022-01-18 (×3): qty 1

## 2022-01-18 MED ORDER — FENTANYL CITRATE (PF) 100 MCG/2ML IJ SOLN
INTRAMUSCULAR | Status: AC
  Filled 2022-01-18: qty 2

## 2022-01-18 MED ORDER — CEFAZOLIN SODIUM-DEXTROSE 2-4 GM/100ML-% IV SOLN
INTRAVENOUS | Status: AC
  Filled 2022-01-18: qty 100

## 2022-01-18 MED ORDER — OXYBUTYNIN CHLORIDE 5 MG PO TABS
ORAL_TABLET | ORAL | Status: AC
  Administered 2022-01-18: 5 mg via ORAL
  Filled 2022-01-18: qty 1

## 2022-01-18 MED ORDER — ACETAMINOPHEN 10 MG/ML IV SOLN
INTRAVENOUS | Status: AC
Start: 2022-01-18 — End: ?
  Filled 2022-01-18: qty 100

## 2022-01-18 MED ORDER — MORPHINE SULFATE (PF) 2 MG/ML IV SOLN: 2.0000 mg | INTRAVENOUS | Status: DC | PRN

## 2022-01-18 MED ORDER — METFORMIN HCL ER 500 MG PO TB24
500.0000 mg | ORAL_TABLET | Freq: Every day | ORAL | Status: DC
  Filled 2022-01-18: qty 1

## 2022-01-18 MED ORDER — ALBUTEROL SULFATE (2.5 MG/3ML) 0.083% IN NEBU
2.5000 mg | INHALATION_SOLUTION | Freq: Once | RESPIRATORY_TRACT | Status: AC
Start: 2022-01-18 — End: 2022-01-18
  Administered 2022-01-18: 2.5 mg via RESPIRATORY_TRACT

## 2022-01-18 MED ORDER — ALBUTEROL SULFATE (2.5 MG/3ML) 0.083% IN NEBU
INHALATION_SOLUTION | RESPIRATORY_TRACT | Status: AC
  Filled 2022-01-18: qty 3

## 2022-01-18 MED ORDER — FENTANYL CITRATE (PF) 100 MCG/2ML IJ SOLN
INTRAMUSCULAR | Status: AC
  Administered 2022-01-18: 25 ug via INTRAVENOUS
  Filled 2022-01-18: qty 2

## 2022-01-18 MED ORDER — DEXAMETHASONE SODIUM PHOSPHATE 10 MG/ML IJ SOLN
INTRAMUSCULAR | Status: DC | PRN
  Administered 2022-01-18: 5 mg via INTRAVENOUS

## 2022-01-18 MED ORDER — MIDAZOLAM HCL 2 MG/2ML IJ SOLN
INTRAMUSCULAR | Status: DC | PRN
  Administered 2022-01-18 (×2): 1 mg via INTRAVENOUS

## 2022-01-18 MED ORDER — ROCURONIUM BROMIDE 10 MG/ML (PF) SYRINGE
PREFILLED_SYRINGE | INTRAVENOUS | Status: AC
  Filled 2022-01-18: qty 10

## 2022-01-18 MED ORDER — INSULIN ASPART 100 UNIT/ML IJ SOLN: 0.0000 [IU] | Freq: Three times a day (TID) | INTRAMUSCULAR | Status: DC

## 2022-01-18 MED ORDER — SURGIFLO WITH THROMBIN (HEMOSTATIC MATRIX KIT) OPTIME
TOPICAL | Status: DC | PRN
  Administered 2022-01-18: 1 via TOPICAL

## 2022-01-18 MED ORDER — FENTANYL CITRATE (PF) 100 MCG/2ML IJ SOLN
INTRAMUSCULAR | Status: DC | PRN
  Administered 2022-01-18: 50 ug via INTRAVENOUS
  Administered 2022-01-18 (×2): 25 ug via INTRAVENOUS

## 2022-01-18 MED ORDER — INSULIN ASPART 100 UNIT/ML IJ SOLN
4.0000 [IU] | Freq: Three times a day (TID) | INTRAMUSCULAR | Status: DC
Start: 2022-01-19 — End: 2022-01-19

## 2022-01-18 MED ORDER — CHLORHEXIDINE GLUCONATE 0.12 % MT SOLN: 15.0000 mL | Freq: Once | OROMUCOSAL | Status: AC

## 2022-01-18 MED ORDER — ORAL CARE MOUTH RINSE: 15.0000 mL | Freq: Once | OROMUCOSAL | Status: AC

## 2022-01-18 MED ORDER — SODIUM CHLORIDE 0.9 % IV SOLN: INTRAVENOUS | Status: DC

## 2022-01-18 MED ORDER — OXYCODONE HCL 5 MG PO TABS
ORAL_TABLET | ORAL | Status: AC
  Filled 2022-01-18: qty 1

## 2022-01-18 MED ORDER — HYDROMORPHONE HCL 1 MG/ML IJ SOLN
INTRAMUSCULAR | Status: DC | PRN
  Administered 2022-01-18 (×2): .5 mg via INTRAVENOUS

## 2022-01-18 MED ORDER — ALBUTEROL SULFATE (2.5 MG/3ML) 0.083% IN NEBU: 2.5000 mg | INHALATION_SOLUTION | Freq: Four times a day (QID) | RESPIRATORY_TRACT | Status: DC | PRN

## 2022-01-18 MED ORDER — ONDANSETRON HCL 4 MG/2ML IJ SOLN: 4.0000 mg | Freq: Once | INTRAMUSCULAR | Status: DC | PRN

## 2022-01-18 MED ORDER — DOCUSATE SODIUM 100 MG PO CAPS
100.0000 mg | ORAL_CAPSULE | Freq: Two times a day (BID) | ORAL | Status: DC
  Administered 2022-01-18 – 2022-01-19 (×2): 100 mg via ORAL
  Filled 2022-01-18 (×2): qty 1

## 2022-01-18 MED ORDER — LIDOCAINE HCL (CARDIAC) PF 100 MG/5ML IV SOSY
PREFILLED_SYRINGE | INTRAVENOUS | Status: DC | PRN
  Administered 2022-01-18: 80 mg via INTRAVENOUS

## 2022-01-18 MED ORDER — ESCITALOPRAM OXALATE 10 MG PO TABS
10.0000 mg | ORAL_TABLET | Freq: Every day | ORAL | Status: DC
  Administered 2022-01-19: 10 mg via ORAL
  Filled 2022-01-18: qty 1

## 2022-01-18 MED ORDER — DEXMEDETOMIDINE (PRECEDEX) IN NS 20 MCG/5ML (4 MCG/ML) IV SYRINGE
PREFILLED_SYRINGE | INTRAVENOUS | Status: DC | PRN
  Administered 2022-01-18: 4 ug via INTRAVENOUS
  Administered 2022-01-18: 8 ug via INTRAVENOUS
  Administered 2022-01-18 (×2): 4 ug via INTRAVENOUS

## 2022-01-18 MED ORDER — ONDANSETRON HCL 4 MG/2ML IJ SOLN
INTRAMUSCULAR | Status: AC
  Filled 2022-01-18: qty 2

## 2022-01-18 MED ORDER — ROCURONIUM BROMIDE 100 MG/10ML IV SOLN
INTRAVENOUS | Status: DC | PRN
  Administered 2022-01-18: 20 mg via INTRAVENOUS
  Administered 2022-01-18: 10 mg via INTRAVENOUS
  Administered 2022-01-18: 50 mg via INTRAVENOUS
  Administered 2022-01-18: 20 mg via INTRAVENOUS

## 2022-01-18 MED ORDER — OXYBUTYNIN CHLORIDE 5 MG PO TABS: 5.0000 mg | ORAL_TABLET | Freq: Three times a day (TID) | ORAL | Status: DC | PRN

## 2022-01-18 MED ORDER — STERILE WATER FOR IRRIGATION IR SOLN
Status: DC | PRN
  Administered 2022-01-18: 3000 mL

## 2022-01-18 MED ORDER — DEXAMETHASONE SODIUM PHOSPHATE 10 MG/ML IJ SOLN
INTRAMUSCULAR | Status: AC
  Filled 2022-01-18: qty 1

## 2022-01-18 MED ORDER — FENTANYL CITRATE (PF) 100 MCG/2ML IJ SOLN
25.0000 ug | INTRAMUSCULAR | Status: DC | PRN
  Administered 2022-01-18 (×2): 25 ug via INTRAVENOUS

## 2022-01-18 MED ORDER — ACETAMINOPHEN 10 MG/ML IV SOLN
INTRAVENOUS | Status: DC | PRN
  Administered 2022-01-18: 1000 mg via INTRAVENOUS

## 2022-01-18 MED ORDER — HEMOSTATIC AGENTS (NO CHARGE) OPTIME
TOPICAL | Status: DC | PRN
  Administered 2022-01-18: 1 via TOPICAL

## 2022-01-18 MED ORDER — PROPOFOL 10 MG/ML IV BOLUS
INTRAVENOUS | Status: DC | PRN
  Administered 2022-01-18: 140 mg via INTRAVENOUS

## 2022-01-18 MED ORDER — MIDAZOLAM HCL 2 MG/2ML IJ SOLN
INTRAMUSCULAR | Status: AC
  Filled 2022-01-18: qty 2

## 2022-01-18 MED ORDER — NICOTINE 21 MG/24HR TD PT24
21.0000 mg | MEDICATED_PATCH | Freq: Every day | TRANSDERMAL | Status: DC
  Administered 2022-01-18 – 2022-01-19 (×2): 21 mg via TRANSDERMAL
  Filled 2022-01-18 (×2): qty 1

## 2022-01-18 MED ORDER — PANTOPRAZOLE SODIUM 40 MG PO TBEC
40.0000 mg | DELAYED_RELEASE_TABLET | Freq: Every day | ORAL | Status: DC
  Administered 2022-01-19: 40 mg via ORAL
  Filled 2022-01-18: qty 1

## 2022-01-18 MED ORDER — PROPOFOL 10 MG/ML IV BOLUS
INTRAVENOUS | Status: AC
  Filled 2022-01-18: qty 20

## 2022-01-18 MED ORDER — ONDANSETRON HCL 4 MG/2ML IJ SOLN
INTRAMUSCULAR | Status: DC | PRN
  Administered 2022-01-18: 4 mg via INTRAVENOUS

## 2022-01-18 MED ORDER — UMECLIDINIUM-VILANTEROL 62.5-25 MCG/ACT IN AEPB
1.0000 | INHALATION_SPRAY | Freq: Every day | RESPIRATORY_TRACT | Status: DC
  Administered 2022-01-19: 1 via RESPIRATORY_TRACT
  Filled 2022-01-18: qty 14

## 2022-01-18 MED ORDER — ONDANSETRON HCL 4 MG/2ML IJ SOLN: 4.0000 mg | INTRAMUSCULAR | Status: DC | PRN

## 2022-01-18 MED ORDER — BUPIVACAINE HCL 0.5 % IJ SOLN
INTRAMUSCULAR | Status: DC | PRN
  Administered 2022-01-18: 20 mL

## 2022-01-18 MED ORDER — DIPHENHYDRAMINE HCL 12.5 MG/5ML PO ELIX: 12.5000 mg | ORAL_SOLUTION | Freq: Four times a day (QID) | ORAL | Status: DC | PRN

## 2022-01-18 MED ORDER — DIPHENHYDRAMINE HCL 50 MG/ML IJ SOLN: 12.5000 mg | Freq: Four times a day (QID) | INTRAMUSCULAR | Status: DC | PRN

## 2022-01-18 MED ORDER — DEXTROSE-NACL 5-0.9 % IV SOLN: INTRAVENOUS | Status: DC | PRN

## 2022-01-18 MED ORDER — INSULIN ASPART 100 UNIT/ML IJ SOLN: 0.0000 [IU] | Freq: Every day | INTRAMUSCULAR | Status: DC

## 2022-01-18 MED ORDER — CHLORHEXIDINE GLUCONATE 0.12 % MT SOLN
OROMUCOSAL | Status: AC
  Administered 2022-01-18: 15 mL via OROMUCOSAL
  Filled 2022-01-18: qty 15

## 2022-01-18 MED ORDER — ATORVASTATIN CALCIUM 20 MG PO TABS
20.0000 mg | ORAL_TABLET | Freq: Every day | ORAL | Status: DC
  Administered 2022-01-19: 20 mg via ORAL
  Filled 2022-01-18: qty 1

## 2022-01-18 MED ORDER — CEFAZOLIN SODIUM-DEXTROSE 1-4 GM/50ML-% IV SOLN
1.0000 g | Freq: Three times a day (TID) | INTRAVENOUS | Status: AC
  Administered 2022-01-18 – 2022-01-19 (×2): 1 g via INTRAVENOUS
  Filled 2022-01-18 (×3): qty 50

## 2022-01-18 MED ORDER — SODIUM CHLORIDE 0.9 % IV SOLN: INTRAVENOUS | Status: DC | PRN

## 2022-01-18 SURGICAL SUPPLY — 96 items
ADH SKN CLS APL DERMABOND .7 (GAUZE/BANDAGES/DRESSINGS) ×1
AGENT HMST KT MTR STRL THRMB (HEMOSTASIS) ×1
ANCHOR TIS RET SYS 235ML (MISCELLANEOUS) ×2 IMPLANT
APL ESCP 34 STRL LF DISP (HEMOSTASIS) ×1
APPLICATOR SURGIFLO ENDO (HEMOSTASIS) ×2 IMPLANT
APPLIER CLIP LOGIC TI 5 (MISCELLANEOUS) IMPLANT
APR CLP MED LRG 33X5 (MISCELLANEOUS)
BAG DRN RND TRDRP ANRFLXCHMBR (UROLOGICAL SUPPLIES) ×1
BAG TISS RTRVL C235 10X14 (MISCELLANEOUS) ×1
BAG URINE DRAIN 2000ML AR STRL (UROLOGICAL SUPPLIES) ×2 IMPLANT
BLADE CLIPPER SURG (BLADE) ×2 IMPLANT
BULB RESERV EVAC DRAIN JP 100C (MISCELLANEOUS) IMPLANT
CATH DRAINAGE MALECOT 26FR (CATHETERS) ×1 IMPLANT
CATH FOL 2WAY LX 18X5 (CATHETERS) ×4 IMPLANT
CATH MALECOT (CATHETERS) ×2
CLIP LIGATING HEM O LOK PURPLE (MISCELLANEOUS) ×11 IMPLANT
COVER TIP SHEARS 8 DVNC (MISCELLANEOUS) ×1 IMPLANT
COVER TIP SHEARS 8MM DA VINCI (MISCELLANEOUS) ×2
DEFOGGER SCOPE WARMER CLEARIFY (MISCELLANEOUS) ×2 IMPLANT
DERMABOND ADVANCED (GAUZE/BANDAGES/DRESSINGS) ×2
DERMABOND ADVANCED .7 DNX12 (GAUZE/BANDAGES/DRESSINGS) ×1 IMPLANT
DRAIN CHANNEL JP 15F RND 16 (MISCELLANEOUS) IMPLANT
DRAIN CHANNEL JP 19F (MISCELLANEOUS) IMPLANT
DRAPE 3/4 80X56 (DRAPES) ×2 IMPLANT
DRAPE ARM DVNC X/XI (DISPOSABLE) ×4 IMPLANT
DRAPE COLUMN DVNC XI (DISPOSABLE) ×1 IMPLANT
DRAPE DA VINCI XI ARM (DISPOSABLE) ×8
DRAPE DA VINCI XI COLUMN (DISPOSABLE) ×2
DRAPE LEGGINS SURG 28X43 STRL (DRAPES) ×2 IMPLANT
DRAPE SURG 17X11 SM STRL (DRAPES) ×8 IMPLANT
DRAPE UNDER BUTTOCK W/FLU (DRAPES) ×2 IMPLANT
DRSG TELFA 3X8 NADH (GAUZE/BANDAGES/DRESSINGS) ×2 IMPLANT
ELECT REM PT RETURN 9FT ADLT (ELECTROSURGICAL) ×2
ELECTRODE REM PT RTRN 9FT ADLT (ELECTROSURGICAL) ×1 IMPLANT
GLOVE BIO SURGEON STRL SZ 6.5 (GLOVE) ×4 IMPLANT
GLOVE SURG UNDER POLY LF SZ7.5 (GLOVE) ×2 IMPLANT
GOWN STRL REUS W/ TWL LRG LVL3 (GOWN DISPOSABLE) ×2 IMPLANT
GOWN STRL REUS W/ TWL XL LVL3 (GOWN DISPOSABLE) ×1 IMPLANT
GOWN STRL REUS W/TWL LRG LVL3 (GOWN DISPOSABLE) ×4
GOWN STRL REUS W/TWL XL LVL3 (GOWN DISPOSABLE) ×2
GRASPER SUT TROCAR 14GX15 (MISCELLANEOUS) ×2 IMPLANT
HEMOSTAT SURGICEL 2X14 (HEMOSTASIS) IMPLANT
HOLDER FOLEY CATH W/STRAP (MISCELLANEOUS) ×2 IMPLANT
IRRIGATION STRYKERFLOW (MISCELLANEOUS) ×1 IMPLANT
IRRIGATOR STRYKERFLOW (MISCELLANEOUS) ×2
KIT PINK PAD W/HEAD ARE REST (MISCELLANEOUS) ×2
KIT PINK PAD W/HEAD ARM REST (MISCELLANEOUS) ×1 IMPLANT
LABEL OR SOLS (LABEL) ×2 IMPLANT
MANIFOLD NEPTUNE II (INSTRUMENTS) ×2 IMPLANT
MARKER SKIN DUAL TIP RULER LAB (MISCELLANEOUS) ×2 IMPLANT
NDL INSUFFLATION 14GA 120MM (NEEDLE) ×1 IMPLANT
NEEDLE HYPO 22GX1.5 SAFETY (NEEDLE) ×2 IMPLANT
NEEDLE INSUFFLATION 14GA 120MM (NEEDLE) ×2 IMPLANT
NS IRRIG 500ML POUR BTL (IV SOLUTION) ×2 IMPLANT
OBTURATOR OPTICAL STANDARD 8MM (TROCAR) ×2
OBTURATOR OPTICAL STND 8 DVNC (TROCAR) ×1
OBTURATOR OPTICALSTD 8 DVNC (TROCAR) ×1 IMPLANT
PACK LAP CHOLECYSTECTOMY (MISCELLANEOUS) ×2 IMPLANT
PAD DRESSING TELFA 3X8 NADH (GAUZE/BANDAGES/DRESSINGS) ×1 IMPLANT
RELOAD STAPLE 60 2.6 WHT THN (STAPLE) IMPLANT
RELOAD STAPLER WHITE 60MM (STAPLE) ×2 IMPLANT
SEAL CANN UNIV 5-8 DVNC XI (MISCELLANEOUS) ×4 IMPLANT
SEAL XI 5MM-8MM UNIVERSAL (MISCELLANEOUS) ×8
SET TUBE SMOKE EVAC HIGH FLOW (TUBING) ×2 IMPLANT
SOLUTION ELECTROLUBE (MISCELLANEOUS) ×2 IMPLANT
SPONGE T-LAP 4X18 ~~LOC~~+RFID (SPONGE) ×2 IMPLANT
SPONGE VERSALON 4X4 4PLY (MISCELLANEOUS) IMPLANT
STAPLE ECHEON FLEX 60 POW ENDO (STAPLE) IMPLANT
STAPLER RELOAD WHITE 60MM (STAPLE) ×4
STAPLER SKIN PROX 35W (STAPLE) ×2 IMPLANT
STRAP SAFETY 5IN WIDE (MISCELLANEOUS) ×4 IMPLANT
SURGIFLO W/THROMBIN 8M KIT (HEMOSTASIS) ×2 IMPLANT
SURGILUBE 2OZ TUBE FLIPTOP (MISCELLANEOUS) ×2 IMPLANT
SUT DVC VLOC 90 3-0 CV23 UNDY (SUTURE) ×4 IMPLANT
SUT DVC VLOC 90 3-0 CV23 VLT (SUTURE) ×2
SUT ETHILON 3-0 FS-10 30 BLK (SUTURE)
SUT MNCRL 4-0 (SUTURE) ×4
SUT MNCRL 4-0 27XMFL (SUTURE) ×2
SUT SILK 2 0 SH (SUTURE) IMPLANT
SUT VIC AB 0 CT1 36 (SUTURE) ×3 IMPLANT
SUT VIC AB 2-0 SH 27 (SUTURE) ×2
SUT VIC AB 2-0 SH 27XBRD (SUTURE) IMPLANT
SUT VICRYL 0 AB UR-6 (SUTURE) ×3 IMPLANT
SUTURE DVC VLC 90 3-0 CV23 VLT (SUTURE) ×1 IMPLANT
SUTURE EHLN 3-0 FS-10 30 BLK (SUTURE) IMPLANT
SUTURE MNCRL 4-0 27XMF (SUTURE) ×2 IMPLANT
SYR 10ML LL (SYRINGE) ×2 IMPLANT
SYR BULB IRRIG 60ML STRL (SYRINGE) ×2 IMPLANT
SYR TOOMEY 50ML (SYRINGE) ×4 IMPLANT
SYR TOOMEY IRRIG 70ML (MISCELLANEOUS) ×2
SYRINGE TOOMEY IRRIG 70ML (MISCELLANEOUS) IMPLANT
TAPE CLOTH 3X10 WHT NS LF (GAUZE/BANDAGES/DRESSINGS) ×4 IMPLANT
TROCAR XCEL 12X100 BLDLESS (ENDOMECHANICALS) ×2 IMPLANT
TROCAR XCEL NON-BLD 5MMX100MML (ENDOMECHANICALS) ×2 IMPLANT
WATER STERILE IRR 3000ML UROMA (IV SOLUTION) ×2 IMPLANT
WATER STERILE IRR 500ML POUR (IV SOLUTION) ×2 IMPLANT

## 2022-01-18 NOTE — Transfer of Care (Signed)
Immediate Anesthesia Transfer of Care Note  Patient: Brian Wolfe  Procedure(s) Performed: XI ROBOTIC ASSISTED LAPAROSCOPIC RADICAL PROSTATECTOMY/ PELVIC LYMPH NODE DISSECTION (Abdomen)  Patient Location: PACU  Anesthesia Type:General  Level of Consciousness: drowsy and patient cooperative  Airway & Oxygen Therapy: Patient Spontanous Breathing, Patient connected to face mask and albuterol treatment for expiratory wheezing  Post-op Assessment: Report given to RN and Patient moving all extremities  Post vital signs: Reviewed and stable  Last Vitals:  Vitals Value Taken Time  BP 136/91 01/18/22 1349  Temp    Pulse 63 01/18/22 1355  Resp 14 01/18/22 1355  SpO2 97 % 01/18/22 1355  Vitals shown include unvalidated device data.  Last Pain:  Vitals:   01/18/22 0912  TempSrc: Temporal  PainSc: 0-No pain         Complications: No notable events documented.

## 2022-01-18 NOTE — Op Note (Signed)
01/18/22  PREOPERATIVE DIAGNOSIS: Prostate cancer.  POSTOPERATIVE DIAGNOSIS: Prostate cancer.  OPERATION PERFORMED: 1. DaVinci laparoscopic radical prostatectomy (partial nerve sparing) 2 DaVinci laproscopic bilateral pelvic lymph node dissection.  SURGEON: [name], MD  ASSISTANTS: Ronalee Belts, MD  ANESTHESIA: General.  EBL: 100 cc  SPECIMEN: Prostate with bilateral seminal vesicals, bilateral pelvic lymph nodes, anterior fat pad.  FINDINGS: Accessory Pudendal Vessel: none  INDICATION: Pt.is a 61 year old male with Gleason 3+4 prostate cancer. Treatment options were discussed with him at length and he chose DaVinci radical prostatectomy.  Very poor baseline erections therefore plan for partial nerve sparing was discussed primarily for the purpose of continence preservation. Bilateral pelvic lymph node dissection was planned due to his risk stratification.  PROCEDURE IN DETAIL: Patient was given appropriate perioperative antibiotics. He had sequential compression devices applied preoperatively for DVT prophylaxis. He was taken to the operating room where he was induced with general anesthesia. After adequate anesthesia, he was placed in the dorsal lithotomy position. His arms were draped by his side and was appropriately padded and secured to the operating room table. He was placed in the Trendelenburg position.  He was prepped and draped in sterile fashion. An 37 French Foley was placed in the bladder and instilled with 15 cc sterile water. Orogastric tube was placed. The Veress needle was passed just above the umbilicus and the abdomen was insufflated to 15 atmospheres. An 8 mm, blunt-tip trocar was placed just above the umbilicus. The zero-degree camera was passed within this and the following trocars were placed under direct vision; 8 mm robotic trocars were placed 9 cm laterally and inferiorly to the initially placed umbilical trocar. A third one was  placed 7 cm lateral to the left-sided trocar. In the corresponding position on the right side, a 12 mm trocar was placed, and then a 5 mm trocar was placed to the right and well above the umbilicus.  The 12 mm assistant port site was preclosed using 0 Vicryl suture using a Carter-Thomason which was tied down at the end of the case to close this port site.  The robot was then docked with the robot trocar. I used the zero-degree camera. I had the hot scissors in the right hand and the left hand with the [place] bipolar and far left hand the Prograsp forceps. Initially I divided the median umbilical ligament bilaterally and the urachus and developed the space of Retzius down to pubic bone. I divided the parietal peritoneum laterally up to the vas deferens on each side. I used the prograsp forceps to provide cranial traction on the urachus. I cleaned off the Endopelvic fascia on each side and then divided it with the scissors laterally to the perirectal fat and medially to the puboprostatic ligaments which were divided. I then ligated the dorsal vein complex using a 60 mm vascular load stapler.   I then addressed the bladder neck. I identified the bladder neck by pulling on the Foley catheter. I divided the anterior bladder neck musculature until I then found the anterior bladder neck mucosa which was incised. I identified the Foley catheter within, deflated the balloon, pulled the Foley out through this opening and then using the Carter-Thomason needle with a #0-Vicryl suture, passed through The suprapubic region and pulled the suture through the eye of the Foley and then back out. This allowed me to provide upward traction on the prostate. I then divided the lateral bladder neck mucosa and the posterior bladder neck mucosa. I was well away from  ureteral orifices. I divided the posterior bladder neck musculature until I identified the vas deferens. They were freed proximally, then  divided. I freed up the seminal vesicals using blunt and sharp dissection. Judicious use of electrocautery was used near the seminal vesicle tips to avoid injury to neurovascular bundle.   I then went back to the 0-degree lens. I divided the Denonvilliers fascia beneath the prostate and developed the prostate off the rectum. I then did a partial nerve sparing by  creating a plane which was intrafascial. I then isolated the pedicles of the prostate and placed weck clips on the pedicles of the prostate and then divided it with cold scissors. I continued to divide the neurovascular bundles off the prostate out to the apex of the prostate. At this point the prostate was freed up except for the urethra. I addressed the prostate anteriorly, divided the dorsal vein , then the anterior urethral wall, pulled the Foley catheter back and then divided the posterior urethral wall. Specimen was completely freed up. I placed the prostate in an Endo catch bag and then placed the bag in the upper abdomen out of the way. I then irrigated the pelvis. The rectal test was negative. There was reasonable hemostasis.  I then did the pelvic lymph node dissection by incising the fascia overlying the right external iliac vein, dissecting distally. I went just distal to the node of [place] where we placed clips and then divided the lymphatics. The lateral aspect of the dissection was the pelvic side wall, inferior was the obturator nerve and proximal the hypogastric vessels. I placed clips at the proximal aspect and then divided the lymphatics. This was removed with the spoon grasper and sent to pathology.   I then did the left obturator lymph node dissection in the same fashion as the left side.  With good hemostasis, I then did the posterior reconstruction. I used a 3-0 VLoc suture through the cut edge of Denonvilliers fascia beneath the bladder on the right side and through the posterior striated sphincter  underneath the urethra. This brought the bladder neck and urethra and closer proximity to help facilitate anastomosis.   I then did the urethral vesicle anastomosis again with two 3-0 VLoc sutures interlocked. I passed both ends of the suture from the outside-in through the bladder neck at the 6 o'clock position. I passed both through the urethral stump from the inside-out in the corresponding position. I reapproximated the bladder neck to the urethra. I then ran the Left suture on the left side anastomosis to the 9 o'clock position. Then I went back to the right sided suture and ran that up the right side to the 12 o'clock position. I then continued the left suture to the 12 o'clock position.  I attempted to suspend the sutures anteriorly from the pubic bone but was not successful in doing so due to the short length of the suture.   I then placed a new 96 French Foley into the bladder and filled it with 10 cc sterile water. I irrigated the bladder with 160 cc. There was no leakage. There was reasonable hemostasis.  Surgicel was used on either side of the pedicles for an additional hemostasis.  Surgi-Flo was also used. The instruments were then removed. The robot was undocked and all the trocars were removed under direct vision. There was good hemostasis. I then enlarged the umbilical trocar site large enough to remove the prostate and I closed the fascia here with #0-0 Vicryl suture  in a running fashion. All the port sites were irrigated. Lidocaine was injected into all the trocar sites. The skin was closed with 4-0 Monocryl in running subcuticular fashion. Dermabond was applied.   At this point patient was awakened and extubated in the operating room and taken to the recovery room in stable condition. There were no complications. All counts correct.  An assistant was required for this surgical procedure. The duties of the assistant included but were not limited to suctioning,  passing suture, camera manipulation, retraction. This procedure would not be able to be performed without an Environmental consultant.    [name], MD
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
## 2022-01-18 NOTE — Anesthesia Procedure Notes (Signed)
Procedure Name: Intubation Date/Time: 01/18/2022 11:02 AM  Performed by: Loletha Grayer, CRNAPre-anesthesia Checklist: Patient identified, Patient being monitored, Timeout performed, Emergency Drugs available and Suction available Patient Re-evaluated:Patient Re-evaluated prior to induction Oxygen Delivery Method: Circle system utilized Preoxygenation: Pre-oxygenation with 100% oxygen Induction Type: IV induction Ventilation: Mask ventilation without difficulty Laryngoscope Size: 3 and McGraph Grade View: Grade I Tube type: Oral Tube size: 7.5 mm Number of attempts: 1 Airway Equipment and Method: Stylet Placement Confirmation: ETT inserted through vocal cords under direct vision, positive ETCO2 and breath sounds checked- equal and bilateral Secured at: 20 cm Tube secured with: Tape Dental Injury: Teeth and Oropharynx as per pre-operative assessment  Comments: Head/neck remained neutral.

## 2022-01-18 NOTE — Interval H&P Note (Signed)
History and Physical Interval Note:  01/18/2022 10:35 AM  Brian Wolfe  has presented today for surgery, with the diagnosis of Prostate Cancer.  The various methods of treatment have been discussed with the patient and family. After consideration of risks, benefits and other options for treatment, the patient has consented to  Procedure(s): XI ROBOTIC [place] (N/A) as a surgical intervention.  The patient's history has been reviewed, patient examined, no change in status, stable for surgery.  I have reviewed the patient's chart and labs.  Questions were answered to the patient's satisfaction.    We did discuss the possibility of proceeding with pelvic lymph node dissection as well.  Calculation by NCCN guidelines indicate right at two-point percent chance on LN involvement.  Likely due to minimal dissection.  We did discuss risks of bleeding and lymphocele as well as nerve injury.  RRR CTAB   [name]

## 2022-01-19 ENCOUNTER — Encounter: Payer: Self-pay | Admitting: Urology

## 2022-01-19 DIAGNOSIS — C61 Malignant neoplasm of prostate: Secondary | ICD-10-CM

## 2022-01-19 LAB — BASIC METABOLIC PANEL
Anion gap: 9 (ref 5–15)
BUN: 9 mg/dL (ref 6–20)
CO2: 23 mmol/L (ref 22–32)
Calcium: 8.9 mg/dL (ref 8.9–10.3)
Chloride: 107 mmol/L (ref 98–111)
Creatinine, Ser: 0.78 mg/dL (ref 0.61–1.24)
GFR, Estimated: >60 mL/min (ref >60)
Glucose, Bld: 172 mg/dL — ABNORMAL HIGH (ref 70–99)
Potassium: 4.2 mmol/L (ref 3.5–5.1)
Sodium: 139 mmol/L (ref 135–145)

## 2022-01-19 LAB — CBC
HCT: 36.9 % — ABNORMAL LOW (ref 39.0–52.0)
Hemoglobin: 12.5 g/dL — ABNORMAL LOW (ref 13.0–17.0)
MCH: 31.8 pg (ref 26.0–34.0)
MCHC: 33.9 g/dL (ref 30.0–36.0)
MCV: 93.9 fL (ref 80.0–100.0)
Platelets: 197 10*3/uL (ref 150–400)
RBC: 3.93 MIL/uL — ABNORMAL LOW (ref 4.22–5.81)
RDW: 13.3 % (ref 11.5–15.5)
WBC: 12.6 10*3/uL — ABNORMAL HIGH (ref 4.0–10.5)
nRBC: 0 % (ref 0.0–0.2)

## 2022-01-19 LAB — GLUCOSE, CAPILLARY
Glucose-Capillary: 165 mg/dL — ABNORMAL HIGH (ref 70–99)
Glucose-Capillary: 167 mg/dL — ABNORMAL HIGH (ref 70–99)

## 2022-01-19 MED ORDER — OXYBUTYNIN CHLORIDE 5 MG PO TABS: 5.0000 mg | ORAL_TABLET | Freq: Three times a day (TID) | ORAL | 0 refills | Status: DC | PRN

## 2022-01-19 MED ORDER — DOCUSATE SODIUM 100 MG PO CAPS: 100.0000 mg | ORAL_CAPSULE | Freq: Two times a day (BID) | ORAL | 0 refills | Status: DC

## 2022-01-19 MED ORDER — OXYCODONE-ACETAMINOPHEN 5-325 MG PO TABS
1.0000 | ORAL_TABLET | ORAL | 0 refills | Status: DC | PRN
Start: 2022-01-19 — End: 2022-02-08

## 2022-01-19 NOTE — Discharge Instructions (Signed)
Post op Catheter care Some patients need to go home with a Foley catheter.  If you have had prostate surgery or have had trouble emptying your bladder, you will have to keep the catheter in for 2-14 days.  The large bag that you have had during the hospital stay is good to use at night so you do not need to get up and empty it in the middle of the night.  You will be given a smaller leg bag that will attach to your calf by elastic straps.  You will be shown how to switch from the large bag to the smaller leg bag before you leave the hospital.  Always keep either bag lower than where the catheter comes out of your body. This helps prevent infection by keeping the urine from going back into your body. Also make sure you empty the bag before it is completely full to avoid any complications.  Clean where the catheter comes out of your body with soap and water and rinse well.  Some men have some discomfort or a pulling sensation at the tip of the penis and may use triple antibiotic ointment at the site to help.  A Medical Assistant or Triage nurse is available 24 hours a day for any medical questions or needs you may have.  The triage staff can help answer questions you may have at any time of day or night. They can be reached at the Clinic's main number of [phone].  Post op Activity/Exercise While it may be tough, patient's are strongly encouraged to begin walking as this is one of the most important things they can do to improve recovery and prevent complications after surgery. Staying active lowers your chance of life-threatening blood clots, infection, and wound problems. We recommend starting slow the first week and gradually increasing total activity daily. It may take 2 weeks to several months before regaining baseline energy and stamina, but do not get discouraged. Aim to spend at least 8 hours out of bed, even if that is sitting upright in a chair. Set daily goals and milestones.  For  example, take a leisurely walk around the neighborhood each day. By week 3, patients should be walking several times a day. By week 6, patients can safely lift more than 10 pounds.  We encourage patients to continue setting goals and increasing their activity level.  Post Constipation This is common after surgery.  Stool softeners can be used to help. Also, drink plenty of fluids and be active.  Adding vegetables and fruits to your diet can also help with constipation.  You may need to take a laxative like milk of magnesia to help your bowels move.  If you have had Prostate Surgery, DO NOT insert anything into your rectum like a suppository or enema.  ?  Post op Showering/Bathing Showering after surgery is safe, but you must be careful as you may be weak after surgery. Having a loved one nearby may be helpful to prevent falls. You may shower and it's ok if your incisions have water run over them.  Post op Bathing Bathing after surgery should be done AFTER 2 weeks. We recommend that patient's avoid baths for 2 weeks because bath water can have dirt (from your body). Submerging in water can cause problems with the incision, which is not fully healed yet.           ?

## 2022-01-19 NOTE — Progress Notes (Signed)
Pt discharged per MD order. IV removed. Discharge instructions reviewed with pt. Pt verbalized understanding. All questions answered to pt satisfaction. Pt switched to leg bag. Provided with overnight bag. Pt taken out in wheelchair by staff.

## 2022-01-19 NOTE — Discharge Summary (Signed)
Date of admission: 01/18/2022  Date of discharge: 01/19/2022  Admission diagnosis: Prostate cancer  Discharge diagnosis: Prostate cancer, status post robotic prostatectomy with bilateral pelvic lymph node dissection  Secondary diagnoses:  Patient Active Problem List   Diagnosis Date Noted   Prostate cancer ([place]) 01/18/2022   Overweight (BMI 25.0-29.9) 08/20/2020   Shift work sleep disorder 08/20/2020   Toenail fungus 03/14/2020   Type 2 diabetes mellitus with other specified complication ([place]) 25/42/7062   Decreased hearing 12/14/2019   Joint pain 12/14/2019   Total bilirubin, elevated 01/12/2018   Pancreatic lesion 01/12/2018   NAFLD (nonalcoholic fatty liver disease) 01/12/2018   Hx of colonic polyp 01/12/2018   Abscess of left groin 01/12/2018   Simple chronic bronchitis ([place]) 03/08/2017   Erectile dysfunction of organic origin 10/13/2015   Hypogonadism in male 04/15/2015   Erectile dysfunction 04/15/2015   BPH with obstruction/lower urinary tract symptoms 04/15/2015   High grade prostatic intraepithelial neoplasia 04/14/2015   Elevated PSA 04/14/2015   Benign enlargement of prostate 04/14/2015   Hyperlipidemia associated with type 2 diabetes mellitus ([place]) 01/07/2015   Anxiety and depression 01/03/2015   GERD (gastroesophageal reflux disease) 01/03/2015   Tobacco user 01/03/2015    History and Physical: For full details, please see admission history and physical. Briefly, Brian Wolfe is a 61 y.o. year old patient with prostate cancer who underwent a robotic laparoscopic radical prostatectomy with bilateral pelvic lymph node dissection on July 21, 2021.   Hospital Course: Patient tolerated the procedure well.  He was then transferred to the floor after an uneventful PACU stay.  His hospital course was uncomplicated.  On POD#1 he had met discharge criteria: was eating a regular diet, was up and ambulating independently,  pain was well controlled, was voiding without a  catheter, and was ready to for discharge.  PE: Constitutional:  Well nourished. Alert and oriented, No acute distress. HEENT: [place] AT, moist mucus membranes.  Trachea midline Cardiovascular: No clubbing, cyanosis, or edema. Respiratory: Normal respiratory effort, no increased work of breathing. GU: No CVA tenderness.  No bladder fullness or masses.  Patient with uncircumcised phallus.  Foley in place  Urethral meatus is patent.  No penile discharge.  Scrotum without lesions, cysts, rashes and/or edema.   Neurologic: Grossly intact, no focal deficits, moving all 4 extremities. Psychiatric: Normal mood and affect.   Laboratory values:  Recent Labs    01/19/22 0525  WBC 12.6*  HGB 12.5*  HCT 36.9*   Recent Labs    01/19/22 0525  NA 139  K 4.2  CL 107  CO2 23  GLUCOSE 172*  BUN 9  CREATININE 0.78  CALCIUM 8.9   No results for input(s): "LABPT", "INR" in the last 72 hours. No results for input(s): "LABURIN" in the last 72 hours. Results for orders placed or performed during the hospital encounter of 01/11/22  Urine Culture     Status: None   Collection Time: 01/11/22 10:51 AM   Specimen: Urine, Random  Result Value Ref Range Status   Specimen Description   Final    URINE, RANDOM Performed at [facility], [address] [place], [place] [address]    Special Requests   Final    NONE Performed at [facility], [address], [place], [place] [address]    Culture   Final    NO GROWTH Performed at [facility], [place] [address] [place], [place] [address]    Report Status 01/12/2022 FINAL  Final    Disposition: Home  Discharge instruction: The patient was instructed to be ambulatory but told to refrain from heavy lifting, strenuous activity, or driving.   Discharge medications:  Allergies as of 01/19/2022       Reactions   Augmentin [amoxicillin-pot Clavulanate] Hives   Patient had a recent course of Augmentin 01/2020 and tolerated     Bactrim [sulfamethoxazole-trimethoprim] Nausea And Vomiting        Medication List     TAKE these medications    albuterol 108 (90 Base) MCG/ACT inhaler Commonly known as: ProAir HFA TAKE 2 PUFFS BY MOUTH EVERY 6 HOURS AS NEEDED FOR WHEEZE OR SHORTNESS OF BREATH   Anoro Ellipta 62.5-25 MCG/ACT Aepb Generic drug: umeclidinium-vilanterol Inhale 1 puff into the lungs daily.   atorvastatin 20 MG tablet Commonly known as: LIPITOR TAKE 1 TABLET BY MOUTH EVERY DAY   blood glucose meter kit and supplies Dispense based on patient and insurance preference. Use up to four times daily as directed. (FOR ICD-10 E10.9, E11.9).   co-enzyme Q-10 30 MG capsule Take 30 mg by mouth 3 (three) times daily.   docusate sodium 100 MG capsule Commonly known as: COLACE Take 1 capsule (100 mg total) by mouth 2 (two) times daily.   escitalopram 10 MG tablet Commonly known as: LEXAPRO TAKE 1 TABLET BY MOUTH EVERY DAY   metFORMIN 750 MG 24 hr tablet Commonly known as: GLUCOPHAGE-XR TAKE 1 TABLET BY MOUTH EVERY DAY WITH BREAKFAST   omeprazole 20 MG capsule Commonly known as: PRILOSEC TAKE 1 CAPSULE BY MOUTH DAILY BEFORE BREAKFAST.   oxybutynin 5 MG tablet Commonly known as: DITROPAN Take 1 tablet (5 mg total) by mouth every 8 (eight) hours as needed for bladder spasms.   oxyCODONE-acetaminophen 5-325 MG tablet Commonly known as: PERCOCET/ROXICET Take 1-2 tablets by mouth every 4 (four) hours as needed for moderate pain.   Ozempic (0.25 or 0.5 MG/DOSE) 2 MG/1.5ML Sopn Generic drug: Semaglutide(0.25 or 0.5MG/DOS) Inject 0.25 mg into the skin once a week.        Followup:    Patient to be discharged with Foley catheter.  Has follow-up scheduled on August 7 for Foley catheter removal.

## 2022-01-19 NOTE — Progress Notes (Signed)
Urology Consult Follow Up  Subjective: POD # 1 -robotic laparoscopic radical prostatectomy (partial nerve sparing) with bilateral pelvic lymph node dissection  Patient is doing well.  He has an appetite and is desiring regular diet.  He states he has been passing flatus, but he has not had a bowel movement.  He also has been ambulatory  VSS afebrile with good urine output.  Serum creatinine 0.78, hemoglobin 12.5 and WBC 12.6.    Anti-infectives: Anti-infectives (From admission, onward)    Start     Dose/Rate Route Frequency Ordered Stop   01/18/22 1900  ceFAZolin (ANCEF) IVPB 1 g/50 mL premix        1 g 100 mL/hr over 30 Minutes Intravenous Every 8 hours 01/18/22 1422 01/19/22 0543   01/18/22 0917  ceFAZolin (ANCEF) 2-4 GM/100ML-% IVPB       Note to Pharmacy: Lorenza Cambridge J: cabinet override      01/18/22 0917 01/18/22 1120   01/18/22 0908  ceFAZolin (ANCEF) IVPB 2g/100 mL premix        2 g 200 mL/hr over 30 Minutes Intravenous 30 min pre-op 01/18/22 0908 01/18/22 1119       Current Facility-Administered Medications  Medication Dose Route Frequency Provider Last Rate Last Admin   0.9 %  sodium chloride infusion   Intravenous Continuous [name], MD 125 mL/hr at 01/19/22 0310 Infusion Verify at 01/19/22 0310   acetaminophen (TYLENOL) tablet 650 mg  650 mg Oral Q4H PRN [name], MD       albuterol (PROVENTIL) (2.5 MG/3ML) 0.083% nebulizer solution 2.5 mg  2.5 mg Inhalation Q6H PRN [name], MD       atorvastatin (LIPITOR) tablet 20 mg  20 mg Oral Daily [name], MD       diphenhydrAMINE (BENADRYL) injection 12.5 mg  12.5 mg Intravenous Q6H PRN [name], MD       Or   diphenhydrAMINE (BENADRYL) 12.5 MG/5ML elixir 12.5 mg  12.5 mg Oral Q6H PRN [name], MD       docusate sodium (COLACE) capsule 100 mg  100 mg Oral BID [name], MD   100 mg at 01/18/22 2142   escitalopram (LEXAPRO) tablet 10 mg  10 mg Oral Daily [name], MD        heparin injection 5,000 Units  5,000 Units Subcutaneous Q8H [name], MD       insulin aspart (novoLOG) injection 0-15 Units  0-15 Units Subcutaneous TID WC [name], MD       insulin aspart (novoLOG) injection 0-5 Units  0-5 Units Subcutaneous QHS [name], MD       insulin aspart (novoLOG) injection 4 Units  4 Units Subcutaneous TID WC [name], MD       metFORMIN (GLUCOPHAGE-XR) 24 hr tablet 500 mg  500 mg Oral Q breakfast [name], MD       morphine (PF) 2 MG/ML injection 2-4 mg  2-4 mg Intravenous Q2H PRN [name], MD       nicotine (NICODERM CQ - dosed in mg/24 hours) patch 21 mg  21 mg Transdermal Daily [name], MD   21 mg at 01/18/22 2132   ondansetron (ZOFRAN) injection 4 mg  4 mg Intravenous Q4H PRN [name], MD       Oral care mouth rinse  15 mL Mouth Rinse PRN [name], MD       oxybutynin (DITROPAN) tablet 5 mg  5 mg Oral Q8H PRN [name], MD  5 mg at 01/18/22 1428   oxyCODONE-acetaminophen (PERCOCET/ROXICET) 5-325 MG per tablet 1-2 tablet  1-2 tablet Oral Q4H PRN [name], MD   1 tablet at 01/19/22 0319   pantoprazole (PROTONIX) EC tablet 40 mg  40 mg Oral Daily [name], MD       umeclidinium-vilanterol [facility] ELLIPTA) 62.5-25 MCG/ACT 1 puff  1 puff Inhalation Daily [name], MD         Objective: Vital signs in last 24 hours: Temp:  [96.8 F (36 C)-98.6 F (37 C)] 98.6 F (37 C) (08/01 0325) Pulse Rate:  [59-77] 77 (08/01 0325) Resp:  [11-19] 15 (08/01 0325) BP: (118-157)/(71-96) 126/74 (08/01 0325) SpO2:  [92 %-99 %] 95 % (08/01 0325) Weight:  [69 kg] 69 kg (07/31 0912)  Intake/Output from previous day: 07/31 0701 - 08/01 0700 In: 1912.8 [I.V.:1662.8; IV Piggyback:250] Out: 2200 [Urine:2100; Blood:100] Intake/Output this shift: Total I/O In: -  Out: 1500 [Urine:1500]   Physical Exam Constitutional:  Well nourished. Alert and oriented, No acute distress. HEENT: [place]  AT, moist mucus membranes.  Trachea midline Cardiovascular: No clubbing, cyanosis, or edema. Respiratory: Normal respiratory effort, no increased work of breathing. GU: No CVA tenderness.  No bladder fullness or masses.  Patient with uncircumcised phallus. Foreskin easily retracted  Urethral meatus is patent.  No penile discharge. No penile lesions or rashes.  Foley catheter in place.  Scrotum without lesions, cysts, rashes and/or edema.   Neurologic: Grossly intact, no focal deficits, moving all 4 extremities. Psychiatric: Normal mood and affect.   Lab Results:  Recent Labs    01/19/22 0525  WBC 12.6*  HGB 12.5*  HCT 36.9*  PLT 197   BMET Recent Labs    01/19/22 0525  NA 139  K 4.2  CL 107  CO2 23  GLUCOSE 172*  BUN 9  CREATININE 0.78  CALCIUM 8.9   PT/INR No results for input(s): "LABPROT", "INR" in the last 72 hours. ABG No results for input(s): "PHART", "HCO3" in the last 72 hours.  Invalid input(s): "PCO2", "PO2"  Studies/Results: No results found.   Assessment and Plan: 61 year old male who is status post robotic prostatectomy for intermediate risk, very low volume favorable prostate cancer.  Recommendations: -Continue ambulation -Advance diet to diabetic regular -Plans for discharge today    LOS: 0 days    [facility] 01/19/2022

## 2022-01-19 NOTE — Progress Notes (Signed)
  Chaplain On-Call responded to Spiritual Care Consult Order from [name], MD. The request was for Advance Directives information for the patient.  Chaplain met the patient and provided the AD documents and education to him. Patient stated that he will discuss the documents further with his wife.  Patient stated also that he expects to be discharged this afternoon. Chaplain assured the patient that he can have the AD documents completed by an [place] whom he knows.  Chaplain Pollyann Samples M.Div., [facility]

## 2022-01-19 NOTE — Anesthesia Postprocedure Evaluation (Signed)
Anesthesia Post Note  Patient: Chadwick Reiswig Dinger  Procedure(s) Performed: XI ROBOTIC ASSISTED LAPAROSCOPIC RADICAL PROSTATECTOMY/ PELVIC LYMPH NODE DISSECTION (Abdomen)  Patient location during evaluation: PACU Anesthesia Type: General Level of consciousness: awake and alert Pain management: pain level controlled Vital Signs Assessment: post-procedure vital signs reviewed and stable Respiratory status: spontaneous breathing, nonlabored ventilation, respiratory function stable and patient connected to nasal cannula oxygen Cardiovascular status: blood pressure returned to baseline and stable Postop Assessment: no apparent nausea or vomiting Anesthetic complications: no   No notable events documented.   Last Vitals:  Vitals:   01/18/22 2108 01/19/22 0325  BP: 127/81 126/74  Pulse: 68 77  Resp: 16 15  Temp: 36.8 C 37 C  SpO2: 95% 95%    Last Pain:  Vitals:   01/19/22 0404  TempSrc:   PainSc: [place]

## 2022-01-19 NOTE — Progress Notes (Signed)
Mobility Specialist - Progress Note    01/19/22 1100  Mobility  Activity Ambulated independently in hallway  Level of Assistance Independent  Assistive Device None  Distance Ambulated (ft) 480 ft  Activity Response Tolerated well  $Mobility charge 1 Mobility    Pt ambulates 3 laps indep around NS voicing no complaints.  Merrily Brittle Mobility Specialist 01/19/22, 11:40 AM

## 2022-01-19 NOTE — Progress Notes (Signed)
Mobility Specialist - Progress Note    01/19/22 0900  Mobility  Activity Ambulated independently in hallway  Level of Assistance Independent  Assistive Device None  Distance Ambulated (ft) 800 ft  Activity Response Tolerated well  $Mobility charge 1 Mobility    Pt ambulated 874f indep.  MMerrily BrittleMobility Specialist 01/19/22, 9:22 AM

## 2022-01-19 NOTE — TOC CM/SW Note (Signed)
Patient has orders to discharge home today. Chart reviewed. PCP is [name], DO. On room air. No discharge wound care needs. No TOC needs identified. CSW signing off.  [place] Scrape, [place]

## 2022-01-20 LAB — SURGICAL PATHOLOGY

## 2022-01-21 ENCOUNTER — Encounter: Payer: Self-pay | Admitting: Family Medicine

## 2022-01-21 DIAGNOSIS — J410 Simple chronic bronchitis: Secondary | ICD-10-CM

## 2022-01-21 NOTE — Telephone Encounter (Signed)
Patient's wife called Triage line to report constipation and urinary leakage from urethra. Denies pain or discomfort. Urine is still draining in bag. Reviewed post op pre-cautions and side effects. Explained bladder spasms. Patient will take oxybutynin as prescribed. Advised to take one capful of Miralax to help with constipation and continue taking Colace. Advised if no improvement to seek medical attention. Voiced understanding.

## 2022-01-22 ENCOUNTER — Other Ambulatory Visit: Payer: Self-pay

## 2022-01-22 DIAGNOSIS — J410 Simple chronic bronchitis: Secondary | ICD-10-CM

## 2022-01-22 MED ORDER — UMECLIDINIUM-VILANTEROL 62.5-25 MCG/ACT IN AEPB: 1.0000 | INHALATION_SPRAY | Freq: Every day | RESPIRATORY_TRACT | 2 refills | Status: DC

## 2022-01-22 MED ORDER — ALBUTEROL SULFATE HFA 108 (90 BASE) MCG/ACT IN AERS: INHALATION_SPRAY | RESPIRATORY_TRACT | 2 refills | Status: DC

## 2022-01-25 ENCOUNTER — Ambulatory Visit (INDEPENDENT_AMBULATORY_CARE_PROVIDER_SITE_OTHER): Payer: BLUE CROSS/BLUE SHIELD | Admitting: Physician Assistant

## 2022-01-25 DIAGNOSIS — C61 Malignant neoplasm of prostate: Secondary | ICD-10-CM

## 2022-01-25 MED ORDER — CIPROFLOXACIN HCL 250 MG PO TABS: 250.0000 mg | ORAL_TABLET | Freq: Two times a day (BID) | ORAL | 0 refills | Status: AC

## 2022-01-25 MED ORDER — DOCUSATE SODIUM 100 MG PO CAPS: 100.0000 mg | ORAL_CAPSULE | Freq: Two times a day (BID) | ORAL | 0 refills | Status: DC

## 2022-01-25 MED ORDER — CIPROFLOXACIN HCL 500 MG PO TABS
500.0000 mg | ORAL_TABLET | Freq: Once | ORAL | Status: AC
  Administered 2022-01-25: 500 mg via ORAL

## 2022-01-25 NOTE — Addendum Note (Signed)
Addended by: Mickle Plumb on: 01/25/2022 09:59 AM   Modules accepted: Orders

## 2022-01-25 NOTE — Progress Notes (Signed)
Catheter Removal  Patient is present today for a catheter removal.  49m of water was drained from the balloon. A 18FR foley cath was removed from the bladder no complications were noted . Patient tolerated well.  Performed by: SDebroah Loop PA-C   Additional notes: Abdominal incisions all clean, dry, and intact with overlying surgical adhesive. Abdomen is soft, normal postoperative tenderness without ridigity, rebound, or guarding. He denies acute worsening in baseline cough or SOB. No BLE edema. Administered Cipro '500mg'$  in clinic for UTI prevention today (PMH DM2, epididymoorchitis after biopsy), will have him complete Cipro '250mg'$  BID x5 doses starting tonight for UTI prevention.  We discussed surgical pathology results, namely finding of Gleason 3+4 prostate cancer consistent with biopsy findings. Negative for nodal involvement x4, EPE, bladder neck invasion, seminal vesical invasion, or lymphovascular invasion. Negative margins.  Follow up/ Additional notes: 6 week postop f/u with Dr. BErlene Quanwith PSA prior

## 2022-01-29 ENCOUNTER — Encounter: Payer: Self-pay | Admitting: Urology

## 2022-02-05 ENCOUNTER — Encounter: Payer: Self-pay | Admitting: Urology

## 2022-02-08 ENCOUNTER — Ambulatory Visit
Admission: RE | Admit: 2022-02-08 | Discharge: 2022-02-08 | Disposition: A | Discharge status: Home / Self Care | Payer: BC Managed Care – PPO | Attending: Urology | Admitting: Urology

## 2022-02-08 ENCOUNTER — Encounter: Payer: Self-pay | Admitting: Urology

## 2022-02-08 ENCOUNTER — Other Ambulatory Visit
Admission: RE | Admit: 2022-02-08 | Discharge: 2022-02-08 | Disposition: A | Discharge status: Home / Self Care | Payer: BC Managed Care – PPO | Source: Home / Self Care | Attending: Urology | Admitting: Urology

## 2022-02-08 ENCOUNTER — Other Ambulatory Visit: Payer: Self-pay

## 2022-02-08 ENCOUNTER — Ambulatory Visit
Admission: RE | Admit: 2022-02-08 | Discharge: 2022-02-08 | Disposition: A | Discharge status: Home / Self Care | Payer: BC Managed Care – PPO | Source: Ambulatory Visit | Attending: Urology | Admitting: Urology

## 2022-02-08 ENCOUNTER — Ambulatory Visit: Payer: BC Managed Care – PPO | Admitting: Urology

## 2022-02-08 VITALS — BP 116/55 | HR 89 | Ht 65.0 in | Wt 153.0 lb

## 2022-02-08 DIAGNOSIS — N390 Urinary tract infection, site not specified: Secondary | ICD-10-CM

## 2022-02-08 DIAGNOSIS — N50811 Right testicular pain: Secondary | ICD-10-CM | POA: Diagnosis not present

## 2022-02-08 DIAGNOSIS — N138 Other obstructive and reflux uropathy: Secondary | ICD-10-CM | POA: Insufficient documentation

## 2022-02-08 DIAGNOSIS — R1031 Right lower quadrant pain: Secondary | ICD-10-CM

## 2022-02-08 DIAGNOSIS — N401 Enlarged prostate with lower urinary tract symptoms: Secondary | ICD-10-CM | POA: Diagnosis present

## 2022-02-08 DIAGNOSIS — R300 Dysuria: Secondary | ICD-10-CM

## 2022-02-08 LAB — URINALYSIS, COMPLETE (UACMP) WITH MICROSCOPIC
Bilirubin Urine: NEGATIVE
Glucose, UA: NEGATIVE mg/dL
Ketones, ur: NEGATIVE mg/dL
Nitrite: NEGATIVE
Protein, ur: 30 mg/dL — AB
Specific Gravity, Urine: 1.025 (ref 1.005–1.030)
pH: 6 (ref 5.0–8.0)

## 2022-02-08 MED ORDER — SULFAMETHOXAZOLE-TRIMETHOPRIM 800-160 MG PO TABS: 1.0000 | ORAL_TABLET | Freq: Two times a day (BID) | ORAL | 0 refills | Status: DC
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
## 2022-02-10 ENCOUNTER — Ambulatory Visit
Admission: RE | Admit: 2022-02-10 | Discharge: 2022-02-10 | Disposition: A | Discharge status: Home / Self Care | Payer: BC Managed Care – PPO | Source: Ambulatory Visit | Attending: Urology | Admitting: Urology

## 2022-02-10 ENCOUNTER — Telehealth: Payer: Self-pay

## 2022-02-10 ENCOUNTER — Other Ambulatory Visit: Payer: Self-pay | Admitting: Urology

## 2022-02-10 ENCOUNTER — Encounter: Payer: Self-pay | Admitting: Urology

## 2022-02-10 ENCOUNTER — Telehealth: Payer: Self-pay | Admitting: Urology

## 2022-02-10 DIAGNOSIS — R1031 Right lower quadrant pain: Secondary | ICD-10-CM | POA: Diagnosis present

## 2022-02-10 NOTE — Telephone Encounter (Signed)
-----   Message from Nori Riis, PA-C sent at 02/10/2022 10:12 AM EDT ----- Would you call the [place] lab and ask them if they can do sensitivities on this bacteria for [place]?

## 2022-02-10 NOTE — Telephone Encounter (Signed)
Spoke w pt's wife. She states pt took ibuprofen around 11:30 last night and is not running a fever this morning. Larene [place] would like to get a stat CT at this time. Advised wife someone would call her back with those instructions after getting approval. She expressed understanding.

## 2022-02-10 NOTE — Telephone Encounter (Signed)
Per the [place] lab, sensitivities are not available for this bacteria.

## 2022-02-10 NOTE — Telephone Encounter (Signed)
Brian Wolfe sent a MyChart message last evening stating that he is continuing to feel fatigue and has now started to have fevers of 101 with shaking chills.  His urine culture is still pending at this time and in fact they had to send it back for regrowth, so I am concerned that the urine culture may be negative and he has something else going on.  I advised him to seek treatment in the emergency room, but if you would call him to let him know our advice to seek further treatment in the emergency department.

## 2022-02-11 ENCOUNTER — Emergency Department: Payer: BC Managed Care – PPO

## 2022-02-11 ENCOUNTER — Telehealth: Payer: Self-pay | Admitting: Urology

## 2022-02-11 ENCOUNTER — Other Ambulatory Visit: Payer: Self-pay

## 2022-02-11 DIAGNOSIS — R748 Abnormal levels of other serum enzymes: Secondary | ICD-10-CM | POA: Diagnosis not present

## 2022-02-11 DIAGNOSIS — Z8546 Personal history of malignant neoplasm of prostate: Secondary | ICD-10-CM | POA: Insufficient documentation

## 2022-02-11 DIAGNOSIS — R109 Unspecified abdominal pain: Secondary | ICD-10-CM | POA: Diagnosis present

## 2022-02-11 DIAGNOSIS — N390 Urinary tract infection, site not specified: Secondary | ICD-10-CM | POA: Diagnosis not present

## 2022-02-11 LAB — URINALYSIS, ROUTINE W REFLEX MICROSCOPIC
Bilirubin Urine: NEGATIVE
Glucose, UA: >=500 mg/dL — AB
Hgb urine dipstick: NEGATIVE
Ketones, ur: NEGATIVE mg/dL
Nitrite: NEGATIVE
Protein, ur: 30 mg/dL — AB
Specific Gravity, Urine: 1.022 (ref 1.005–1.030)
Squamous Epithelial / HPF: NONE SEEN (ref 0–5)
pH: 5 (ref 5.0–8.0)

## 2022-02-11 LAB — CBC WITH DIFFERENTIAL/PLATELET
Abs Immature Granulocytes: 0.03 10*3/uL (ref 0.00–0.07)
Basophils Absolute: 0 10*3/uL (ref 0.0–0.1)
Basophils Relative: 1 %
Eosinophils Absolute: 1 10*3/uL — ABNORMAL HIGH (ref 0.0–0.5)
Eosinophils Relative: 13 %
HCT: 38.9 % — ABNORMAL LOW (ref 39.0–52.0)
Hemoglobin: 13.2 g/dL (ref 13.0–17.0)
Immature Granulocytes: 0 %
Lymphocytes Relative: 7 %
Lymphs Abs: 0.5 10*3/uL — ABNORMAL LOW (ref 0.7–4.0)
MCH: 31.4 pg (ref 26.0–34.0)
MCHC: 33.9 g/dL (ref 30.0–36.0)
MCV: 92.4 fL (ref 80.0–100.0)
Monocytes Absolute: 0.6 10*3/uL (ref 0.1–1.0)
Monocytes Relative: 9 %
Neutro Abs: 5.1 10*3/uL (ref 1.7–7.7)
Neutrophils Relative %: 70 %
Platelets: 168 10*3/uL (ref 150–400)
RBC: 4.21 MIL/uL — ABNORMAL LOW (ref 4.22–5.81)
RDW: 13.2 % (ref 11.5–15.5)
WBC: 7.3 10*3/uL (ref 4.0–10.5)
nRBC: 0 % (ref 0.0–0.2)

## 2022-02-11 NOTE — ED Triage Notes (Signed)
Pt presents to ER with c/o right lower back pain, abd pain and some right side chest pain that started Tuesday night and has been getting worse since then.  Pt states he was seen here for OP CT scan which was normal.  Pt has also been on bactrim since Monday for UTI.  Pt states he has not been getting better since being on bactrim.  Pt is A&O x4 at this time in NAD at this time.

## 2022-02-11 NOTE — Telephone Encounter (Signed)
There is a Pharmacist, community message from Mrs. Esquivel stating that his fever is starting to go back up and he is dizzy and acting like he is drunk.  I strongly recommend that he go to the ED at this time.  It's possible that this is not completely urological and they need to rule other things out. (Infection somewhere else, blood loss, heart issues, etc.)

## 2022-02-11 NOTE — Telephone Encounter (Signed)
Spoke w pt's wife, advised her of message. She states pt is very hesitant about going to the ED as she has tried to get him to go overnight already. Wife states that she is going to call PCP and try to get him in today and start there.

## 2022-02-12 ENCOUNTER — Emergency Department
Admission: EM | Admit: 2022-02-12 | Discharge: 2022-02-12 | Disposition: A | Discharge status: Home / Self Care | Payer: BC Managed Care – PPO | Attending: Emergency Medicine | Admitting: Emergency Medicine

## 2022-02-12 DIAGNOSIS — N390 Urinary tract infection, site not specified: Secondary | ICD-10-CM

## 2022-02-12 LAB — COMPREHENSIVE METABOLIC PANEL
ALT: 154 U/L — ABNORMAL HIGH (ref 0–44)
AST: 84 U/L — ABNORMAL HIGH (ref 15–41)
Albumin: 4 g/dL (ref 3.5–5.0)
Alkaline Phosphatase: 140 U/L — ABNORMAL HIGH (ref 38–126)
Anion gap: 9 (ref 5–15)
BUN: 9 mg/dL (ref 6–20)
CO2: 22 mmol/L (ref 22–32)
Calcium: 9.4 mg/dL (ref 8.9–10.3)
Chloride: 103 mmol/L (ref 98–111)
Creatinine, Ser: 0.84 mg/dL (ref 0.61–1.24)
GFR, Estimated: >60 mL/min (ref >60)
Glucose, Bld: 227 mg/dL — ABNORMAL HIGH (ref 70–99)
Potassium: 3.6 mmol/L (ref 3.5–5.1)
Sodium: 134 mmol/L — ABNORMAL LOW (ref 135–145)
Total Bilirubin: 1.1 mg/dL (ref 0.3–1.2)
Total Protein: 7.8 g/dL (ref 6.5–8.1)

## 2022-02-12 LAB — TROPONIN I (HIGH SENSITIVITY)
Troponin I (High Sensitivity): 4 ng/L (ref <18)
Troponin I (High Sensitivity): 4 ng/L (ref <18)

## 2022-02-12 LAB — LIPASE, BLOOD: Lipase: 49 U/L (ref 11–51)

## 2022-02-12 MED ORDER — DIPHENHYDRAMINE HCL 50 MG/ML IJ SOLN
25.0000 mg | Freq: Once | INTRAMUSCULAR | Status: AC
  Administered 2022-02-12: 25 mg via INTRAVENOUS

## 2022-02-12 MED ORDER — SODIUM CHLORIDE 0.9 % IV SOLN
1.0000 g | Freq: Once | INTRAVENOUS | Status: AC
  Administered 2022-02-12: 1 g via INTRAVENOUS
  Filled 2022-02-12: qty 10

## 2022-02-12 MED ORDER — DIPHENHYDRAMINE HCL 50 MG/ML IJ SOLN
INTRAMUSCULAR | Status: AC
  Filled 2022-02-12: qty 1

## 2022-02-12 MED ORDER — CEFDINIR 300 MG PO CAPS: 300.0000 mg | ORAL_CAPSULE | Freq: Two times a day (BID) | ORAL | 0 refills | Status: AC

## 2022-02-12 NOTE — Discharge Instructions (Addendum)
Discontinue the Bactrim and start taking the Omnicef prescribed today.  Finish the full 2-week course.  Return to the ER for new, worsening, or persistent severe pain, nausea or vomiting, inability to take the medication, continued fevers, weakness or lightheadedness, or any other new or worsening symptoms that concern you.  Your liver enzymes and alkaline phosphatase are mildly elevated on the lab work-up today.  We have no recent labs available for comparison.  This should be rechecked by your doctor on your next visit.

## 2022-02-12 NOTE — ED Provider Notes (Signed)
[facility] Provider Note    Event Date/Time   First MD Initiated Contact with Patient 02/12/22 [phone]     (approximate)   History   Abdominal Pain and Back Pain   HPI  Brian Wolfe is a 61 y.o. male with history of prostate cancer who presents with right-sided abdominal flank pain over the last several days, persistent course, and associated with decreased appetite but no nausea or vomiting.  He has had some low-grade fevers to about 100.4 in the evenings.  He was diagnosed with a UTI several days ago and started on Bactrim but the symptoms persisted despite the treatment.  However he denies any significant worsening of symptoms.  He states he talked to his doctors who advised him to come to the ER for further evaluation.    Physical Exam   Triage Vital Signs: ED Triage Vitals  Enc Vitals Group     BP 02/11/22 2300 131/85     Pulse Rate 02/11/22 2300 98     Resp 02/11/22 2300 18     Temp 02/11/22 2300 99.2 F (37.3 C)     Temp Source 02/11/22 2300 Oral     SpO2 02/11/22 2300 95 %     Weight --      Height --      Head Circumference --      Peak Flow --      Pain Score 02/11/22 2256 6     Pain Loc --      Pain Edu? --      Excl. in [place]? --     Most recent vital signs: Vitals:   02/12/22 0130 02/12/22 0205  BP: 127/80 130/73  Pulse: 78 75  Resp: 19 17  Temp:    SpO2: 96% 97%     General: Alert and oriented, comfortable appearing. CV:  Good peripheral perfusion.  Resp:  Normal effort.  Abd:  Soft with mild right flank tenderness.  No peritoneal signs.  No distention.  Other:  No peripheral edema.   ED Results / Procedures / Treatments   Labs (all labs ordered are listed, but only abnormal results are displayed) Labs Reviewed  CBC WITH DIFFERENTIAL/PLATELET - Abnormal; Notable for the following components:      Result Value   RBC 4.21 (*)    HCT 38.9 (*)    Lymphs Abs 0.5 (*)    Eosinophils Absolute 1.0 (*)    All other  components within normal limits  COMPREHENSIVE METABOLIC PANEL - Abnormal; Notable for the following components:   Sodium 134 (*)    Glucose, Bld 227 (*)    AST 84 (*)    ALT 154 (*)    Alkaline Phosphatase 140 (*)    All other components within normal limits  URINALYSIS, ROUTINE W REFLEX MICROSCOPIC - Abnormal; Notable for the following components:   Color, Urine AMBER (*)    APPearance CLEAR (*)    Glucose, UA >=500 (*)    Protein, ur 30 (*)    Leukocytes,Ua SMALL (*)    Bacteria, UA RARE (*)    All other components within normal limits  LIPASE, BLOOD  TROPONIN I (HIGH SENSITIVITY)  TROPONIN I (HIGH SENSITIVITY)     EKG  ED ECG REPORT I, Arta Silence, the attending physician, personally viewed and interpreted this ECG.  Date: 02/11/2022 EKG Time: 2254 Rate: 92 Rhythm: normal sinus rhythm QRS Axis: normal Intervals: normal ST/T Wave abnormalities: normal Narrative Interpretation: no evidence of  acute ischemia    RADIOLOGY  Chest x-ray: I independently viewed and interpreted the images; there are bilateral interstitial markings with no focal consolidation or edema  PROCEDURES:  Critical Care performed: No  Procedures   MEDICATIONS ORDERED IN ED: Medications  cefTRIAXone (ROCEPHIN) 1 g in sodium chloride 0.9 % 100 mL IVPB (0 g Intravenous Stopped 02/12/22 0214)  diphenhydrAMINE (BENADRYL) injection 25 mg (0 mg Intravenous Not Given 02/12/22 0217)     IMPRESSION / MDM / ASSESSMENT AND PLAN / ED COURSE  I reviewed the triage vital signs and the nursing notes.  61 year old male with PMH as noted above presents with persistent right-sided flank pain, low-grade fevers, and decreased appetite after being diagnosed with a UTI and started on Bactrim.  Reviewed the past medical records.  The patient was admitted at the beginning of the month for robotic prostatectomy.  Per the outpatient note from 8/21, the patient had a UTI diagnosed at that time and was  started on Bactrim.  I reviewed the culture but there are no sensitivities.  The patient also had a CT abdomen/pelvis on 8/23 which showed no acute findings.  Differential diagnosis includes, but is not limited to, UTI, pyelonephritis, gastroenteritis.  Given the negative CT 2 days ago, and the patient having persistent rather than worsening or progressive symptoms, there is no evidence of colitis, diverticulitis, appendicitis, or other acute abdominal cause.  On exam the patient is overall well-appearing with normal vital signs except for borderline elevated temperature.  Patient's presentation is most consistent with acute presentation with potential threat to life or bodily function.  Urinalysis is consistent with continued UTI.  Lab work-up shows negative troponins x2, normal lipase, and no leukocytosis.  LFTs are minimally elevated as is the alkaline phosphatase although I have no prior labs for comparison.  The CT did not show any hepatobiliary abnormality.  We will give a dose of ceftriaxone and reassess.  The patient is on the cardiac monitor to evaluate for evidence of arrhythmia and/or significant heart rate changes.  ----------------------------------------- 3:24 AM on 02/12/2022 -----------------------------------------  The patient received ceftriaxone.  He had some mild itching so was given Benadryl, however he did not demonstrate any signs of anaphylaxis.  I will switch him from the Bactrim to [facility] given the concern that he may develop pyelonephritis.  I did consider whether the patient may require admission given that he has failed treatment with Bactrim and has been having some fevers.  However, the patient expressed a strong preference to go home if at all possible.  Given that his vital signs are normal, his mental status is normal, he is tolerating p.o., and has no evidence of sepsis, I think it is appropriate to discharge him with an alternate antibiotic as per his  preference.  I counseled the patient on the results of work-up and plan of care.  I gave him very strict return precautions and he expressed understanding.  He is stable for discharge at this time.   FINAL CLINICAL IMPRESSION(S) / ED DIAGNOSES   Final diagnoses:  Urinary tract infection without hematuria, site unspecified     Rx / DC Orders   ED Discharge Orders          Ordered    cefdinir (OMNICEF) 300 MG capsule  2 times daily        02/12/22 0323             Note:  This document was prepared using Dragon voice recognition software and  may include unintentional dictation errors.    Arta Silence, MD 02/12/22 [phone]
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
## 2022-02-18 ENCOUNTER — Encounter: Payer: Self-pay | Admitting: Family Medicine

## 2022-02-18 ENCOUNTER — Encounter: Payer: Self-pay | Admitting: Urology

## 2022-02-18 ENCOUNTER — Ambulatory Visit: Payer: BC Managed Care – PPO | Admitting: Urology

## 2022-02-18 VITALS — BP 115/66 | HR 76 | Ht 65.0 in | Wt 152.0 lb

## 2022-02-18 DIAGNOSIS — N393 Stress incontinence (female) (male): Secondary | ICD-10-CM | POA: Diagnosis not present

## 2022-02-18 DIAGNOSIS — R7989 Other specified abnormal findings of blood chemistry: Secondary | ICD-10-CM | POA: Diagnosis not present

## 2022-02-18 DIAGNOSIS — R4586 Emotional lability: Secondary | ICD-10-CM

## 2022-02-18 DIAGNOSIS — R1031 Right lower quadrant pain: Secondary | ICD-10-CM

## 2022-02-18 DIAGNOSIS — F32A Depression, unspecified: Secondary | ICD-10-CM

## 2022-02-18 DIAGNOSIS — N3090 Cystitis, unspecified without hematuria: Secondary | ICD-10-CM | POA: Diagnosis not present

## 2022-02-18 MED ORDER — CIPROFLOXACIN HCL 500 MG PO TABS: 500.0000 mg | ORAL_TABLET | Freq: Two times a day (BID) | ORAL | 0 refills | Status: DC

## 2022-02-19 LAB — URINALYSIS, COMPLETE
Bilirubin, UA: NEGATIVE
Glucose, UA: NEGATIVE
Ketones, UA: NEGATIVE
Nitrite, UA: NEGATIVE
RBC, UA: NEGATIVE
Specific Gravity, UA: 1.03 (ref 1.005–1.030)
Urobilinogen, Ur: 0.2 mg/dL (ref 0.2–1.0)
pH, UA: 5 (ref 5.0–7.5)

## 2022-02-19 LAB — SUSCEPTIBILITY RESULT

## 2022-02-19 LAB — SUSCEPTIBILITY, AER + ANAEROB

## 2022-02-19 LAB — MICROSCOPIC EXAMINATION

## 2022-02-19 LAB — HEPATITIS C ANTIBODY: Hep C Virus Ab: NONREACTIVE

## 2022-02-19 MED ORDER — ESCITALOPRAM OXALATE 20 MG PO TABS: 20.0000 mg | ORAL_TABLET | Freq: Every day | ORAL | 3 refills | Status: DC

## 2022-02-24 LAB — CULTURE, URINE COMPREHENSIVE

## 2022-02-25 ENCOUNTER — Ambulatory Visit: Payer: BC Managed Care – PPO | Admitting: Physical Therapy

## 2022-02-25 ENCOUNTER — Other Ambulatory Visit: Payer: Self-pay | Admitting: *Deleted

## 2022-02-25 DIAGNOSIS — N138 Other obstructive and reflux uropathy: Secondary | ICD-10-CM

## 2022-02-26 ENCOUNTER — Other Ambulatory Visit: Payer: BC Managed Care – PPO

## 2022-02-26 DIAGNOSIS — N138 Other obstructive and reflux uropathy: Secondary | ICD-10-CM

## 2022-02-26 LAB — URINE CULTURE: Culture: 80000 — AB

## 2022-02-27 LAB — PSA: Prostate Specific Ag, Serum: <0.1 ng/mL (ref 0.0–4.0)

## 2022-03-01 ENCOUNTER — Ambulatory Visit: Payer: BC Managed Care – PPO | Attending: Urology | Admitting: Physical Therapy

## 2022-03-01 ENCOUNTER — Encounter: Payer: Self-pay | Admitting: Physical Therapy

## 2022-03-01 DIAGNOSIS — R29898 Other symptoms and signs involving the musculoskeletal system: Secondary | ICD-10-CM | POA: Insufficient documentation

## 2022-03-01 DIAGNOSIS — R278 Other lack of coordination: Secondary | ICD-10-CM | POA: Insufficient documentation

## 2022-03-01 DIAGNOSIS — N393 Stress incontinence (female) (male): Secondary | ICD-10-CM | POA: Insufficient documentation

## 2022-03-01 DIAGNOSIS — M6281 Muscle weakness (generalized): Secondary | ICD-10-CM | POA: Diagnosis present
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
## 2022-03-02 ENCOUNTER — Encounter: Payer: Self-pay | Admitting: *Deleted

## 2022-03-02 ENCOUNTER — Encounter: Payer: Self-pay | Admitting: Urology

## 2022-03-02 ENCOUNTER — Ambulatory Visit: Payer: BLUE CROSS/BLUE SHIELD | Admitting: Urology

## 2022-03-02 VITALS — BP 124/77 | HR 69 | Ht 65.0 in | Wt 152.0 lb

## 2022-03-02 DIAGNOSIS — N393 Stress incontinence (female) (male): Secondary | ICD-10-CM

## 2022-03-02 DIAGNOSIS — E291 Testicular hypofunction: Secondary | ICD-10-CM

## 2022-03-02 DIAGNOSIS — C61 Malignant neoplasm of prostate: Secondary | ICD-10-CM

## 2022-03-02 DIAGNOSIS — N5231 Erectile dysfunction following radical prostatectomy: Secondary | ICD-10-CM

## 2022-03-02 DIAGNOSIS — Z8546 Personal history of malignant neoplasm of prostate: Secondary | ICD-10-CM

## 2022-03-02 MED ORDER — SILDENAFIL CITRATE 100 MG PO TABS: 100.0000 mg | ORAL_TABLET | Freq: Every day | ORAL | 3 refills | Status: DC
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
## 2022-03-04 ENCOUNTER — Encounter: Payer: BC Managed Care – PPO | Admitting: Physical Therapy

## 2022-03-04 ENCOUNTER — Ambulatory Visit: Payer: BLUE CROSS/BLUE SHIELD | Admitting: Urology

## 2022-03-08 ENCOUNTER — Encounter: Payer: Self-pay | Admitting: Urology

## 2022-03-08 ENCOUNTER — Ambulatory Visit: Payer: BC Managed Care – PPO | Admitting: Urology

## 2022-03-08 ENCOUNTER — Other Ambulatory Visit: Payer: Self-pay

## 2022-03-08 ENCOUNTER — Other Ambulatory Visit
Admission: RE | Admit: 2022-03-08 | Discharge: 2022-03-08 | Disposition: A | Discharge status: Home / Self Care | Payer: BC Managed Care – PPO | Attending: Urology | Admitting: Urology

## 2022-03-08 VITALS — BP 145/76 | HR 60 | Ht 65.0 in | Wt 155.0 lb

## 2022-03-08 DIAGNOSIS — C61 Malignant neoplasm of prostate: Secondary | ICD-10-CM

## 2022-03-08 DIAGNOSIS — R3915 Urgency of urination: Secondary | ICD-10-CM

## 2022-03-08 DIAGNOSIS — N393 Stress incontinence (female) (male): Secondary | ICD-10-CM

## 2022-03-08 DIAGNOSIS — R351 Nocturia: Secondary | ICD-10-CM

## 2022-03-08 DIAGNOSIS — N138 Other obstructive and reflux uropathy: Secondary | ICD-10-CM | POA: Diagnosis present

## 2022-03-08 DIAGNOSIS — N401 Enlarged prostate with lower urinary tract symptoms: Secondary | ICD-10-CM | POA: Diagnosis present

## 2022-03-08 DIAGNOSIS — Z8546 Personal history of malignant neoplasm of prostate: Secondary | ICD-10-CM

## 2022-03-08 LAB — URINALYSIS, COMPLETE (UACMP) WITH MICROSCOPIC
Bilirubin Urine: NEGATIVE
Glucose, UA: NEGATIVE mg/dL
Hgb urine dipstick: NEGATIVE
Ketones, ur: NEGATIVE mg/dL
Leukocytes,Ua: NEGATIVE
Nitrite: NEGATIVE
Specific Gravity, Urine: >1.03 — ABNORMAL HIGH (ref 1.005–1.030)
pH: 5.5 (ref 5.0–8.0)

## 2022-03-08 MED ORDER — GEMTESA 75 MG PO TABS
75.0000 mg | ORAL_TABLET | Freq: Every day | ORAL | 0 refills | Status: DC
Start: 2022-03-08 — End: 2022-03-08

## 2022-03-08 MED ORDER — GEMTESA 75 MG PO TABS: 75.0000 mg | ORAL_TABLET | Freq: Every day | ORAL | 0 refills | Status: DC

## 2022-03-08 NOTE — Addendum Note (Signed)
Addended by: Amado Coe on: 03/08/2022 11:14 AM   Modules accepted: Orders
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
## 2022-03-09 ENCOUNTER — Telehealth: Payer: Self-pay | Admitting: *Deleted

## 2022-03-09 LAB — URINE CULTURE: Culture: NO GROWTH

## 2022-03-09 NOTE — Telephone Encounter (Signed)
Left message for patient to call back to schedule follow up lung cancer screening CT scan.  

## 2022-03-10 ENCOUNTER — Encounter: Payer: BC Managed Care – PPO | Admitting: Physical Therapy

## 2022-03-22 DIAGNOSIS — R3915 Urgency of urination: Secondary | ICD-10-CM

## 2022-03-22 MED ORDER — GEMTESA 75 MG PO TABS: 75.0000 mg | ORAL_TABLET | Freq: Every day | ORAL | 3 refills | Status: DC

## 2022-03-26 ENCOUNTER — Encounter: Payer: BC Managed Care – PPO | Admitting: Physical Therapy

## 2022-04-06 ENCOUNTER — Encounter: Payer: BC Managed Care – PPO | Admitting: Physical Therapy

## 2022-04-12 ENCOUNTER — Encounter: Payer: BLUE CROSS/BLUE SHIELD | Admitting: Family Medicine

## 2022-04-13 ENCOUNTER — Encounter: Payer: BC Managed Care – PPO | Admitting: Physical Therapy

## 2022-04-22 ENCOUNTER — Encounter: Payer: BC Managed Care – PPO | Admitting: Physical Therapy

## 2022-04-30 ENCOUNTER — Other Ambulatory Visit: Payer: Self-pay | Admitting: Family Medicine

## 2022-04-30 DIAGNOSIS — E1169 Type 2 diabetes mellitus with other specified complication: Secondary | ICD-10-CM

## 2022-05-03 ENCOUNTER — Other Ambulatory Visit: Payer: Self-pay

## 2022-05-03 ENCOUNTER — Encounter: Payer: Self-pay | Admitting: Family Medicine

## 2022-05-03 ENCOUNTER — Other Ambulatory Visit: Payer: Self-pay | Admitting: Urology

## 2022-05-03 DIAGNOSIS — R3915 Urgency of urination: Secondary | ICD-10-CM

## 2022-05-03 MED ORDER — MIRABEGRON ER 25 MG PO TB24
25.0000 mg | ORAL_TABLET | Freq: Every day | ORAL | 3 refills | Status: DC
Start: 2022-05-03 — End: 2022-05-12

## 2022-05-03 NOTE — Telephone Encounter (Signed)
Requested medications are due for refill today.  yes  Requested medications are on the active medications list.  yes  Last refill. 01/04/2022 4.5 mL 1 rf  Future visit scheduled.   no  Notes to clinic.  Sig from request differs from sig on med list. Please review    Requested Prescriptions  Pending Prescriptions Disp Refills   OZEMPIC, 0.25 OR 0.5 MG/DOSE, 2 MG/3ML SOPN [Pharmacy Med Name: OZEMPIC 0.25-0.5 MG/DOSE PEN]  1    Sig: INJECT 0.25 MG INTO THE SKIN ONCE A WEEK. FOR FIRST 4 WEEKS. THEN INCREASE DOSE TO 0.'5MG'$  WEEKLY     Endocrinology:  Diabetes - GLP-1 Receptor Agonists - semaglutide Failed - 04/30/2022 11:30 PM      Failed - HBA1C in normal range and within 180 days    Hemoglobin A1C  Date Value Ref Range Status  01/04/2022 6.2 (A) 4.0 - 5.6 % Final   Hgb A1c MFr Bld  Date Value Ref Range Status  10/01/2021 7.8 (H) <5.7 % of total Hgb Final    Comment:    For someone without known diabetes, a hemoglobin A1c value of 6.5% or greater indicates that they may have  diabetes and this should be confirmed with a follow-up  test. . For someone with known diabetes, a value <7% indicates  that their diabetes is well controlled and a value  greater than or equal to 7% indicates suboptimal  control. A1c targets should be individualized based on  duration of diabetes, age, comorbid conditions, and  other considerations. . Currently, no consensus exists regarding use of hemoglobin A1c for diagnosis of diabetes for children. .          Passed - Cr in normal range and within 360 days    Creat  Date Value Ref Range Status  03/13/2021 0.88 0.70 - 1.30 mg/dL Final   Creatinine, Ser  Date Value Ref Range Status  02/11/2022 0.84 0.61 - 1.24 mg/dL Final         Passed - Valid encounter within last 6 months    Recent Outpatient Visits           3 months ago Type 2 diabetes mellitus with other specified complication, without long-term current use of insulin ([place])   [place], DO   6 months ago Type 2 diabetes mellitus with other specified complication, without long-term current use of insulin ([place])   [facility], Devonne Doughty, DO   8 months ago Type 2 diabetes mellitus with other specified complication, without long-term current use of insulin [facility])   [facility], Devonne Doughty, DO   1 year ago Annual physical exam   [facility] Olin Hauser, DO   1 year ago Type 2 diabetes mellitus with other specified complication, without long-term current use of insulin ([place])   [place], Devonne Doughty, DO       Future Appointments             In 10 months [name], MD [place]

## 2022-05-07 ENCOUNTER — Encounter: Payer: Self-pay | Admitting: Family Medicine

## 2022-05-07 ENCOUNTER — Ambulatory Visit (INDEPENDENT_AMBULATORY_CARE_PROVIDER_SITE_OTHER): Payer: BLUE CROSS/BLUE SHIELD | Admitting: Family Medicine

## 2022-05-07 VITALS — BP 115/69 | HR 66 | Ht 65.0 in | Wt 154.0 lb

## 2022-05-07 DIAGNOSIS — H6123 Impacted cerumen, bilateral: Secondary | ICD-10-CM | POA: Diagnosis not present

## 2022-05-07 DIAGNOSIS — J440 Chronic obstructive pulmonary disease with acute lower respiratory infection: Secondary | ICD-10-CM | POA: Diagnosis not present

## 2022-05-07 DIAGNOSIS — J410 Simple chronic bronchitis: Secondary | ICD-10-CM | POA: Diagnosis not present

## 2022-05-07 DIAGNOSIS — J209 Acute bronchitis, unspecified: Secondary | ICD-10-CM | POA: Diagnosis not present

## 2022-05-07 MED ORDER — PREDNISONE 20 MG PO TABS: ORAL_TABLET | ORAL | 0 refills | Status: DC

## 2022-05-07 MED ORDER — LEVOFLOXACIN 500 MG PO TABS: 500.0000 mg | ORAL_TABLET | Freq: Every day | ORAL | 0 refills | Status: DC

## 2022-05-07 NOTE — Progress Notes (Signed)
Subjective:    Patient ID: Brian Wolfe, male    DOB: 11/23/1960, 61 y.o.   MRN: [id number]  Brian Wolfe is a 61 y.o. male presenting on 05/07/2022 for Sinusitis & COPD   HPI  Acute COPD Exacerbation Sinusitis / URI Reports onset 1-2 weeks ago, with sinus pressure congestion, persistent symptoms, has had episode in past 1-2 days with dizzy out of body experience, and sneeze and blow nose felt dizzy near pass out. He has had wheezing symptoms with breathing, some winded and productive cough with chest congestion deeper Using Anoro daily Prior CT imaging 2022 with emphysema Smoker Has Albuterol AS NEEDED but not using regularly Tried Mucinex AS NEEDED but not always taking  Bilateral Cerumen Impaction Both ears with impacted wax reduced hearing, failed flushing at home.      10/19/2021   10:20 AM 09/03/2021    8:59 AM 03/13/2021    8:02 AM  Depression screen PHQ 2/9  Decreased Interest '1 1 1  '$ Down, Depressed, Hopeless 1 0 0  PHQ - 2 Score '2 1 1  '$ Altered sleeping 0 3 1  Tired, decreased energy '2 2 2  '$ Change in appetite 2 0 0  Feeling bad or failure about yourself  0 1 1  Trouble concentrating 0 0 0  Moving slowly or fidgety/restless 0 1 0  Suicidal thoughts 0 0 0  PHQ-9 Score '6 8 5  '$ Difficult doing work/chores Not difficult at all Not difficult at all Not difficult at all    Social History   Tobacco Use   Smoking status: Every Day    Packs/day: 1.00    Years: 40.00    Total pack years: 40.00    Types: Cigarettes   Smokeless tobacco: Never  Vaping Use   Vaping Use: Never used  Substance Use Topics   Alcohol use: No    Alcohol/week: 0.0 standard drinks of alcohol   Drug use: No    Review of Systems Per HPI unless specifically indicated above     Objective:    BP 115/69   Pulse 66   Ht '5\' 5"'$  (1.651 m)   Wt 154 lb (69.9 kg)   SpO2 99%   BMI 25.63 kg/m   Wt Readings from Last 3 Encounters:  05/07/22 154 lb (69.9 kg)  03/08/22 155 lb (70.3 kg)   03/02/22 152 lb (68.9 kg)    Physical Exam Vitals and nursing note reviewed.  Constitutional:      General: He is not in acute distress.    Appearance: He is well-developed. He is not diaphoretic.     Comments: Well-appearing, comfortable, cooperative  HENT:     Head: Normocephalic and atraumatic.     Right Ear: There is impacted cerumen.     Left Ear: There is impacted cerumen.  Eyes:     General:        Right eye: No discharge.        Left eye: No discharge.     Conjunctiva/sclera: Conjunctivae normal.  Neck:     Thyroid: No thyromegaly.  Cardiovascular:     Rate and Rhythm: Normal rate and regular rhythm.     Pulses: Normal pulses.     Heart sounds: Normal heart sounds. No murmur heard. Pulmonary:     Effort: Pulmonary effort is normal. No respiratory distress.     Breath sounds: Wheezing (end expiratory wheezing both sides) and rhonchi present. No rales.  Musculoskeletal:  General: Normal range of motion.     Cervical back: Normal range of motion and neck supple.  Lymphadenopathy:     Cervical: No cervical adenopathy.  Skin:    General: Skin is warm and dry.     Findings: No erythema or rash.  Neurological:     Mental Status: He is alert and oriented to person, place, and time. Mental status is at baseline.  Psychiatric:        Behavior: Behavior normal.     Comments: Well groomed, good eye contact, normal speech and thoughts    ________________________________________________________ PROCEDURE NOTE Date: 05/07/22 Bilateral Ear Lavage / Cerumen Removal Discussed benefits and risks (including pain / discomforts, dizziness, minor abrasion of ear canal). Verbal consent given by patient. Medication:  carbamide peroxide ear drops, Ear Lavage Solution (warm water + hydrogen peroxide) Performed by Loni Muse CMA and Dr [place] Ranger Several drops of carbamide peroxide placed in each ear, allowed to sit for few minutes. Ear lavage solution flushed into one ear  at a time in attempt to dislodge and remove ear wax.  Results were successful bilateral.  Repeat Ear Exam: - Completely removed cerumen now, with clear ear canals and visible TMs clear and normal appearance.    Results for orders placed or performed during the hospital encounter of 03/08/22  Urine Culture   Specimen: Urine, Random  Result Value Ref Range   Specimen Description      URINE, RANDOM Performed at [facility], [address] [place], [place] [address]    Special Requests      NONE Performed at [facility], [address], [place], [place] [address]    Culture      NO GROWTH Performed at [facility], [place] [address] [place], [place] [address]    Report Status 03/09/2022 FINAL   Urinalysis, Complete w Microscopic  Result Value Ref Range   Color, Urine YELLOW YELLOW   APPearance CLEAR CLEAR   Specific Gravity, Urine >1.030 (H) 1.005 - 1.030   pH 5.5 5.0 - 8.0   Glucose, UA NEGATIVE NEGATIVE mg/dL   Hgb urine dipstick NEGATIVE NEGATIVE   Bilirubin Urine NEGATIVE NEGATIVE   Ketones, ur NEGATIVE NEGATIVE mg/dL   Protein, ur TRACE (A) NEGATIVE mg/dL   Nitrite NEGATIVE NEGATIVE   Leukocytes,Ua NEGATIVE NEGATIVE   Squamous Epithelial / LPF 0-5 0 - 5   WBC, UA 0-5 0 - 5 WBC/hpf   RBC / HPF 0-5 0 - 5 RBC/hpf   Bacteria, UA RARE (A) NONE SEEN      Assessment & Plan:   Problem List Items Addressed This Visit     Simple chronic bronchitis (HCC) - Primary   Relevant Medications   levofloxacin (LEVAQUIN) 500 MG tablet   predniSONE (DELTASONE) 20 MG tablet   Other Visit Diagnoses     COPD with acute bronchitis (HCC)       Relevant Medications   levofloxacin (LEVAQUIN) 500 MG tablet   predniSONE (DELTASONE) 20 MG tablet   Bilateral impacted cerumen           Consistent with mild acute exacerbation of COPD with worsening productive cough. Initial onset viral URI sinusitis now seems lower respiratory bronchitis  - No hypoxia  (99% on RA), afebrile, no recent hospitalization - Continues Albuterol AS NEEDED and Anoro daily  Plan: Start taking Levaquin antibiotic '500mg'$  daily x 7 days Prednisone taper Printed these rx since local pharmacy didn't have prednisone in stock Use  albuterol q 4 hr regularly x 2-3 days. Continue maintenance inhalers  F/u if not improving consider CXR or return visit  #Ear wax cerumen impaction - Resolved s/p flushing, see note above Tolerated well Hearing improved    Meds ordered this encounter  Medications   levofloxacin (LEVAQUIN) 500 MG tablet    Sig: Take 1 tablet (500 mg total) by mouth daily. For 7 days    Dispense:  7 tablet    Refill:  0   predniSONE (DELTASONE) 20 MG tablet    Sig: Take daily with food. Start with '60mg'$  (3 pills) x 2 days, then reduce to '40mg'$  (2 pills) x 2 days, then '20mg'$  (1 pill) x 3 days    Dispense:  13 tablet    Refill:  0     Follow up plan: Return if symptoms worsen or fail to improve.  [name], [facility] 05/07/2022, 10:58 AM

## 2022-05-07 NOTE — Patient Instructions (Addendum)
Thank you for coming to the office today.  COPD Emphysema flare up Start taking Levaquin antibiotic '500mg'$  daily x 7 days Careful with high impact activity Start Prednisone taper for cough and breathing.  Ear wax flushing today  Please schedule a Follow-up Appointment to: Return if symptoms worsen or fail to improve.  If you have any other questions or concerns, please feel free to call the office or send a message through [place]. You may also schedule an earlier appointment if necessary.  Additionally, you may be receiving a survey about your experience at our office within a few days to 1 week by e-mail or mail. We value your feedback.  [name], DO [place]

## 2022-05-10 LAB — HM DIABETES EYE EXAM

## 2022-05-11 ENCOUNTER — Telehealth: Payer: Self-pay | Admitting: Urology

## 2022-05-11 ENCOUNTER — Other Ambulatory Visit: Payer: Self-pay | Admitting: Urology

## 2022-05-11 NOTE — Telephone Encounter (Signed)
His insurance is in.  I would like to send in a script for [place] XL 60 mg daily.

## 2022-05-12 ENCOUNTER — Other Ambulatory Visit: Payer: Self-pay

## 2022-05-12 MED ORDER — TROSPIUM CHLORIDE ER 60 MG PO CP24: 60.0000 mg | ORAL_CAPSULE | Freq: Every day | ORAL | 3 refills | Status: DC

## 2022-05-12 NOTE — Telephone Encounter (Signed)
Rx sent to pts pharmacy, pt notified. ,

## 2022-05-16 ENCOUNTER — Other Ambulatory Visit: Payer: Self-pay | Admitting: Urology

## 2022-05-20 ENCOUNTER — Encounter: Payer: Self-pay | Admitting: Family Medicine

## 2022-05-20 ENCOUNTER — Other Ambulatory Visit: Payer: Self-pay

## 2022-05-20 DIAGNOSIS — K219 Gastro-esophageal reflux disease without esophagitis: Secondary | ICD-10-CM

## 2022-05-20 MED ORDER — OMEPRAZOLE 20 MG PO CPDR: DELAYED_RELEASE_CAPSULE | ORAL | 3 refills | Status: DC

## 2022-05-28 ENCOUNTER — Encounter: Payer: Self-pay | Admitting: Urology

## 2022-06-13 ENCOUNTER — Other Ambulatory Visit: Payer: Self-pay | Admitting: Family Medicine

## 2022-06-13 DIAGNOSIS — F419 Anxiety disorder, unspecified: Secondary | ICD-10-CM

## 2022-06-13 DIAGNOSIS — E1169 Type 2 diabetes mellitus with other specified complication: Secondary | ICD-10-CM

## 2022-06-15 ENCOUNTER — Other Ambulatory Visit: Payer: Self-pay | Admitting: Internal Medicine

## 2022-06-17 NOTE — Telephone Encounter (Signed)
Requested medication (s) are due for refill today:   Yes  Requested medication (s) are on the active medication list:   Yes  Future visit scheduled:   No   Last ordered: 02/19/2022 #90, 3 refills  Returned because a DX Code and a 90 day supply.  Requested Prescriptions  Pending Prescriptions Disp Refills   escitalopram (LEXAPRO) 20 MG tablet [Pharmacy Med Name: ESCITALOPRAM 20 MG TABLET] 90 tablet 4    Sig: TAKE 1 TABLET BY MOUTH EVERY DAY     Psychiatry:  Antidepressants - SSRI Passed - 06/13/2022 11:32 AM      Passed - Completed PHQ-2 or PHQ-9 in the last 360 days      Passed - Valid encounter within last 6 months    Recent Outpatient Visits           1 month ago Simple chronic bronchitis ([place])   [place], DO   5 months ago Type 2 diabetes mellitus with other specified complication, without long-term current use of insulin ([place])   [facility], Devonne Doughty, DO   8 months ago Type 2 diabetes mellitus with other specified complication, without long-term current use of insulin [facility])   [facility], Devonne Doughty, DO   9 months ago Type 2 diabetes mellitus with other specified complication, without long-term current use of insulin ([place])   [facility] Olin Hauser, DO   1 year ago Annual physical exam   [facility] Olin Hauser, DO       Future Appointments             In 8 months [name], MD [place]

## 2022-06-25 ENCOUNTER — Encounter: Payer: Self-pay | Admitting: Family Medicine

## 2022-07-07 ENCOUNTER — Encounter: Payer: Self-pay | Admitting: Family Medicine

## 2022-07-08 ENCOUNTER — Other Ambulatory Visit: Payer: Self-pay

## 2022-07-08 DIAGNOSIS — K219 Gastro-esophageal reflux disease without esophagitis: Secondary | ICD-10-CM

## 2022-07-08 MED ORDER — OMEPRAZOLE 20 MG PO CPDR: DELAYED_RELEASE_CAPSULE | ORAL | 3 refills | Status: DC

## 2022-07-12 ENCOUNTER — Other Ambulatory Visit: Payer: Self-pay | Admitting: Internal Medicine

## 2022-07-12 DIAGNOSIS — J410 Simple chronic bronchitis: Secondary | ICD-10-CM

## 2022-07-13 NOTE — Telephone Encounter (Signed)
Requested Prescriptions  Pending Prescriptions Disp Refills   albuterol (VENTOLIN HFA) 108 (90 Base) MCG/ACT inhaler [Pharmacy Med Name: ALBUTEROL HFA (PROAIR) INHALER] 8.5 each 0    Sig: TAKE 2 PUFFS BY MOUTH EVERY 6 HOURS AS NEEDED FOR WHEEZE OR SHORTNESS OF BREATH     Pulmonology:  Beta Agonists 2 Passed - 07/12/2022  1:21 AM      Passed - Last BP in normal range    BP Readings from Last 1 Encounters:  05/07/22 115/69         Passed - Last Heart Rate in normal range    Pulse Readings from Last 1 Encounters:  05/07/22 66         Passed - Valid encounter within last 12 months    Recent Outpatient Visits           2 months ago Simple chronic bronchitis [facility])   [facility], Devonne Doughty, DO   6 months ago Type 2 diabetes mellitus with other specified complication, without long-term current use of insulin [facility])   [place], DO   8 months ago Type 2 diabetes mellitus with other specified complication, without long-term current use of insulin [facility])   [place], DO   10 months ago Type 2 diabetes mellitus with other specified complication, without long-term current use of insulin [facility])   [facility] Olin Hauser, DO   1 year ago Annual physical exam   [place], [place], DO       Future Appointments             In 7 months [name], MD [place]

## 2022-07-16 ENCOUNTER — Encounter: Payer: Self-pay | Admitting: *Deleted

## 2022-08-11 ENCOUNTER — Other Ambulatory Visit: Payer: Self-pay | Admitting: Family Medicine

## 2022-08-11 DIAGNOSIS — K219 Gastro-esophageal reflux disease without esophagitis: Secondary | ICD-10-CM

## 2022-08-11 NOTE — Telephone Encounter (Signed)
I need more information to proceed.  Pharmacy says request alternative. I do not see an alternate medication listed.  Checking on the prescribing side, it says Omeprazole is preferred.  Please have patient check with insurance / pharmacy to find which alternative is preferred that I can order  Also, if he prefers to purchase this OTC 67m daily that is an option as well.  [facility]obie Putnam [facility] 08/11/2022, 5:56 PM

## 2022-08-11 NOTE — Telephone Encounter (Signed)
Requested medication (s) are due for refill today -no  Requested medication (s) are on the active medication list -yes  Future visit scheduled -yes  Last refill: 07/08/22  Notes to clinic: Pharmacy request: Alternative requested- not covered by insurance  Requested Prescriptions  Pending Prescriptions Disp Refills   omeprazole (PRILOSEC) 20 MG capsule [Pharmacy Med Name: OMEPRAZOLE DR 20 MG CAPSULE] 90 capsule 3    Sig: TAKE 1 CAPSULE BY MOUTH EVERY [place]     Gastroenterology: Proton Pump Inhibitors Passed - 08/11/2022  7:13 AM      Passed - Valid encounter within last 12 months    Recent Outpatient Visits           3 months ago Simple chronic bronchitis ([place])   [place], DO   7 months ago Type 2 diabetes mellitus with other specified complication, without long-term current use of insulin ([place])   [facility] [place], Devonne Doughty, DO   9 months ago Type 2 diabetes mellitus with other specified complication, without long-term current use of insulin ([place])   [facility] [address], Devonne Doughty, DO   11 months ago Type 2 diabetes mellitus with other specified complication, without long-term current use of insulin ([place])   [place], DO   1 year ago Annual physical exam   [address], Devonne Doughty, DO       Future Appointments             In 1 week [place], Devonne Doughty, [facility], [place]   In 6 months [name], MD [facility] [place]               Requested Prescriptions  Pending Prescriptions Disp Refills   omeprazole (PRILOSEC) 20 MG capsule [Pharmacy Med Name: OMEPRAZOLE DR 20 MG CAPSULE] 90 capsule 3    Sig: TAKE 1 [place]     Gastroenterology: Proton Pump  Inhibitors Passed - 08/11/2022  7:13 AM      Passed - Valid encounter within last 12 months    Recent Outpatient Visits           3 months ago Simple chronic bronchitis ([place])   [place], DO   7 months ago Type 2 diabetes mellitus with other specified complication, without long-term current use of insulin [facility])   [facility] [place], Devonne Doughty, DO   9 months ago Type 2 diabetes mellitus with other specified complication, without long-term current use of insulin [facility])   [facility] [place], Devonne Doughty, DO   11 months ago Type 2 diabetes mellitus with other specified complication, without long-term current use of insulin [facility])   [facility] [place], Devonne Doughty, DO   1 year ago Annual physical exam   [place], DO       Future Appointments             In 1 week [place], Devonne Doughty, DO [facility], [place]   In 6 months [name], MD [employer]

## 2022-08-16 ENCOUNTER — Encounter: Payer: Self-pay | Admitting: Family Medicine

## 2022-08-16 ENCOUNTER — Other Ambulatory Visit: Payer: Self-pay | Admitting: Family Medicine

## 2022-08-16 ENCOUNTER — Other Ambulatory Visit: Payer: Self-pay

## 2022-08-16 ENCOUNTER — Other Ambulatory Visit: Payer: Self-pay | Admitting: Urology

## 2022-08-16 DIAGNOSIS — K219 Gastro-esophageal reflux disease without esophagitis: Secondary | ICD-10-CM

## 2022-08-16 DIAGNOSIS — E785 Hyperlipidemia, unspecified: Secondary | ICD-10-CM

## 2022-08-16 DIAGNOSIS — E1169 Type 2 diabetes mellitus with other specified complication: Secondary | ICD-10-CM

## 2022-08-16 NOTE — Telephone Encounter (Signed)
Requested medication (s) are due for refill today: yes  Requested medication (s) are on the active medication list: yes  Last refill: 08/25/21 #90/3  Future visit scheduled: yes  Notes to clinic:  Unable to refill per protocol due to failed labs, no updated results.    Requested Prescriptions  Pending Prescriptions Disp Refills   atorvastatin (LIPITOR) 20 MG tablet [Pharmacy Med Name: ATORVASTATIN 20 MG TABLET] 90 tablet 3    Sig: TAKE 1 TABLET BY MOUTH EVERY DAY     Cardiovascular:  Antilipid - Statins Failed - 08/16/2022  1:41 AM      Failed - Lipid Panel in normal range within the last 12 months    Cholesterol, Total  Date Value Ref Range Status  07/29/2015 165 100 - 199 mg/dL Final   Cholesterol  Date Value Ref Range Status  03/13/2021 146 <200 mg/dL Final   LDL Cholesterol (Calc)  Date Value Ref Range Status  03/13/2021 80 mg/dL (calc) Final    Comment:    Reference range: <100 . Desirable range <100 mg/dL for primary prevention;   <70 mg/dL for patients with CHD or diabetic patients  with > or = 2 CHD risk factors. [place] Kitchen LDL-C is now calculated using the Martin-Hopkins  calculation, which is a validated novel method providing  better accuracy than the Friedewald equation in the  estimation of LDL-C.  Cresenciano Genre et al. Annamaria Helling. [id number]): 2061-2068  (http://education.[employer].com/faq/FAQ164)    HDL  Date Value Ref Range Status  03/13/2021 46 > OR = 40 mg/dL Final  07/29/2015 37 (L) >39 mg/dL Final   Triglycerides  Date Value Ref Range Status  03/13/2021 104 <150 mg/dL Final         Passed - Patient is not pregnant      Passed - Valid encounter within last 12 months    Recent Outpatient Visits           3 months ago Simple chronic bronchitis ([place])   [facility] [place], Devonne Doughty, DO   7 months ago Type 2 diabetes mellitus with other specified complication, without long-term current use of insulin [facility])   [place], DO   10 months ago Type 2 diabetes mellitus with other specified complication, without long-term current use of insulin [facility])   [facility] O[facility] Haus[place], DO   11 months ago Type 2 diabetes mellitus with other specified complication, without long-term current use of insulin [facility])   [facility] O[facility] Haus[facility], DO   1 year ago Annual physical exam   [place], Devonne Doughty, DO       Future Appointments             In 4 days [place], Devonne Doughty, [facility], [place]   In 6 months [name], [place]

## 2022-08-18 ENCOUNTER — Other Ambulatory Visit: Payer: Self-pay | Admitting: Family Medicine

## 2022-08-18 ENCOUNTER — Encounter: Payer: Self-pay | Admitting: Family Medicine

## 2022-08-18 ENCOUNTER — Other Ambulatory Visit: Payer: BLUE CROSS/BLUE SHIELD

## 2022-08-18 ENCOUNTER — Ambulatory Visit: Payer: BLUE CROSS/BLUE SHIELD | Admitting: Family Medicine

## 2022-08-18 VITALS — BP 120/60 | HR 68 | Ht 65.0 in | Wt 157.2 lb

## 2022-08-18 DIAGNOSIS — E538 Deficiency of other specified B group vitamins: Secondary | ICD-10-CM

## 2022-08-18 DIAGNOSIS — E785 Hyperlipidemia, unspecified: Secondary | ICD-10-CM

## 2022-08-18 DIAGNOSIS — N3281 Overactive bladder: Secondary | ICD-10-CM | POA: Diagnosis not present

## 2022-08-18 DIAGNOSIS — R351 Nocturia: Secondary | ICD-10-CM

## 2022-08-18 DIAGNOSIS — E1169 Type 2 diabetes mellitus with other specified complication: Secondary | ICD-10-CM | POA: Diagnosis not present

## 2022-08-18 DIAGNOSIS — R3915 Urgency of urination: Secondary | ICD-10-CM

## 2022-08-18 DIAGNOSIS — C61 Malignant neoplasm of prostate: Secondary | ICD-10-CM | POA: Diagnosis not present

## 2022-08-18 DIAGNOSIS — K219 Gastro-esophageal reflux disease without esophagitis: Secondary | ICD-10-CM | POA: Diagnosis not present

## 2022-08-18 DIAGNOSIS — E559 Vitamin D deficiency, unspecified: Secondary | ICD-10-CM

## 2022-08-18 DIAGNOSIS — Z0000 Encounter for general adult medical examination without abnormal findings: Secondary | ICD-10-CM

## 2022-08-18 LAB — POCT GLYCOSYLATED HEMOGLOBIN (HGB A1C): Hemoglobin A1C: 7.2 % — AB (ref 4.0–5.6)

## 2022-08-18 MED ORDER — SUCRALFATE 1 G PO TABS: 1.0000 g | ORAL_TABLET | Freq: Three times a day (TID) | ORAL | 2 refills | Status: AC

## 2022-08-18 NOTE — Assessment & Plan Note (Signed)
A1c controlled, but inc now at 7.2, prior 6.2 Complications - hyperlipidemia, GERD GI upset on higher doses of metformin, hypoglycemia on higher ozempic  Plan:  1. Continue current therapy - Ozempic 0.'25mg'$  weekly low dose only, Metformin XR '750mg'$  daily 2. Encourage improved lifestyle - low carb, low sugar diet, reduce portion size, continue improving regular exercise 3. Check CBG , bring log to next visit for review 4. Continue Statin  Urine microalbumin DM Foot

## 2022-08-18 NOTE — Addendum Note (Signed)
Addended by: Kris Mouton on: 08/18/2022 09:19 AM   Modules accepted: Orders

## 2022-08-18 NOTE — Patient Instructions (Addendum)
Thank you for coming to the office today.  Recent Labs    10/01/21 0855 01/04/22 0939 08/18/22 0901  HGBA1C 7.8* 6.2* 7.2*   Overall A1c is still well controlled.  No change to medications, keep Ozempic 0.'25mg'$  and Metformin   GERD Acid reflux Keep Omeprazole '20mg'$  daily for prevention If you have a bad day / flare of stomach Reach for the Carafate (Sucralfate) tablet for quick relief, instead of TUMs etc. Can take with or before a meal if you know it will trigger, or can take as soon as you get flare of symptoms. Let me know how it works and if you need more  Alternative options, dose increase Omeprazole to '40mg'$  daily prevention, OR can take an extra '20mg'$  if needed.   Please schedule a Follow-up Appointment to: Return in about 5 months (around 01/16/2023) for early to mid August 2024 fasting lab only then 1 week later Annual Physical.  If you have any other questions or concerns, please feel free to call the office or send a message through [place]. You may also schedule an earlier appointment if necessary.  Additionally, you may be receiving a survey about your experience at our office within a few days to 1 week by e-mail or mail. We value your feedback.  [name], DO [place]

## 2022-08-18 NOTE — Progress Notes (Signed)
Subjective:    Patient ID: Brian Wolfe, male    DOB: 07-27-1960, 62 y.o.   MRN: [id number]  Brian Wolfe is a 62 y.o. male presenting on 08/18/2022 for Diabetes   HPI  Type 2 Diabetes Previously July 2023 with A1c 6.2 controlled Now some gradual increase overall but avg CBG record from home readings looks good, occasional high 180-200+ but very rare, mostly avg 120-150. Due for A1c today Now improved appetite feels more balanced. Gas has reduced and GI intolerance now off higher dose metformin, and now further low sugars Current meds Ozempic 0.'25mg'$  weekly Metformin XR '750mg'$  x 1 daily   Hypogonadism Followed by Urology Previously on Testosterone  BPH LUTS / History prostate cancer S/p Prostatectomy 12/2021 Followed by Urology Prior trial on medication management On Trospium '60mg'$  24 hr daily currently Worsening side effects dry eye dry mouth and heartburn       10/19/2021   10:20 AM 09/03/2021    8:59 AM 03/13/2021    8:02 AM  Depression screen PHQ 2/9  Decreased Interest '1 1 1  '$ Down, Depressed, Hopeless 1 0 0  PHQ - 2 Score '2 1 1  '$ Altered sleeping 0 3 1  Tired, decreased energy '2 2 2  '$ Change in appetite 2 0 0  Feeling bad or failure about yourself  0 1 1  Trouble concentrating 0 0 0  Moving slowly or fidgety/restless 0 1 0  Suicidal thoughts 0 0 0  PHQ-9 Score '6 8 5  '$ Difficult doing work/chores Not difficult at all Not difficult at all Not difficult at all    Social History   Tobacco Use   Smoking status: Every Day    Packs/day: 1.00    Years: 40.00    Total pack years: 40.00    Types: Cigarettes   Smokeless tobacco: Never  Vaping Use   Vaping Use: Never used  Substance Use Topics   Alcohol use: No    Alcohol/week: 0.0 standard drinks of alcohol   Drug use: No    Review of Systems Per HPI unless specifically indicated above     Objective:    BP 120/60   Pulse 68   Ht '5\' 5"'$  (1.651 m)   Wt 157 lb 3.2 oz (71.3 kg)   SpO2 97%   BMI 26.16  kg/m   Wt Readings from Last 3 Encounters:  08/18/22 157 lb 3.2 oz (71.3 kg)  05/07/22 154 lb (69.9 kg)  03/08/22 155 lb (70.3 kg)    Physical Exam Vitals and nursing note reviewed.  Constitutional:      General: He is not in acute distress.    Appearance: Normal appearance. He is well-developed. He is not diaphoretic.     Comments: Well-appearing, comfortable, cooperative  HENT:     Head: Normocephalic and atraumatic.  Eyes:     General:        Right eye: No discharge.        Left eye: No discharge.     Conjunctiva/sclera: Conjunctivae normal.  Cardiovascular:     Rate and Rhythm: Normal rate.  Pulmonary:     Effort: Pulmonary effort is normal.  Skin:    General: Skin is warm and dry.     Findings: No erythema or rash.  Neurological:     Mental Status: He is alert and oriented to person, place, and time.  Psychiatric:        Mood and Affect: Mood normal.  Behavior: Behavior normal.        Thought Content: Thought content normal.     Comments: Well groomed, good eye contact, normal speech and thoughts     Diabetic Foot Exam - Simple   Simple Foot Form Diabetic Foot exam was performed with the following findings: Yes 08/18/2022  9:20 AM  Visual Inspection See comments: Yes Sensation Testing Intact to touch and monofilament testing bilaterally: Yes Pulse Check Posterior Tibialis and Dorsalis pulse intact bilaterally: Yes Comments Right great toe without toenail. Nailbed is healed. Some mild callus formation heels. Intact monofilament sensation.      Recent Labs    10/01/21 0855 01/04/22 0939 08/18/22 0901  HGBA1C 7.8* 6.2* 7.2*    Results for orders placed or performed in visit on 05/11/22  HM DIABETES EYE EXAM  Result Value Ref Range   HM Diabetic Eye Exam No Retinopathy No Retinopathy      Assessment & Plan:   Problem List Items Addressed This Visit     GERD (gastroesophageal reflux disease)   Relevant Medications   sucralfate (CARAFATE) 1  g tablet   Hyperlipidemia associated with type 2 diabetes mellitus ([place])   Type 2 diabetes mellitus with other specified complication (HCC) - Primary    A1c controlled, but inc now at 7.2, prior 6.2 Complications - hyperlipidemia, GERD GI upset on higher doses of metformin, hypoglycemia on higher ozempic  Plan:  1. Continue current therapy - Ozempic 0.'25mg'$  weekly low dose only, Metformin XR '750mg'$  daily 2. Encourage improved lifestyle - low carb, low sugar diet, reduce portion size, continue improving regular exercise 3. Check CBG , bring log to next visit for review 4. Continue Statin  Urine microalbumin DM Foot      Relevant Orders   POCT HgB A1C (Completed)   Urine Microalbumin w/creat. ratio   Other Visit Diagnoses     OAB (overactive bladder)            GERD Acid reflux Keep Omeprazole '20mg'$  daily for prevention Likely secondary flare worse from side effect anticholinergic med Trospium Trial on Carafate AS NEEDED symptom relief Alternative options, dose increase Omeprazole to '40mg'$  daily prevention, OR can take an extra '20mg'$  if needed.  Meds ordered this encounter  Medications   sucralfate (CARAFATE) 1 g tablet    Sig: Take 1 tablet (1 g total) by mouth 4 (four) times daily -  with meals and at bedtime. As needed for acid reflux    Dispense:  30 tablet    Refill:  2      Follow up plan: Return in about 5 months (around 01/16/2023) for early to mid August 2024 fasting lab only then 1 week later Annual Physical.  Future labs ordered for 01/2023   [name], [facility] 08/18/2022, 8:57 AM

## 2022-08-19 LAB — MICROALBUMIN / CREATININE URINE RATIO
Creatinine, Urine: 248 mg/dL (ref 20–320)
Microalb Creat Ratio: 10 mcg/mg creat (ref <30)
Microalb, Ur: 2.5 mg/dL

## 2022-08-19 LAB — PSA: Prostate Specific Ag, Serum: <0.1 ng/mL (ref 0.0–4.0)

## 2022-08-20 ENCOUNTER — Ambulatory Visit: Payer: BLUE CROSS/BLUE SHIELD | Admitting: Family Medicine

## 2022-08-20 ENCOUNTER — Other Ambulatory Visit: Payer: BC Managed Care – PPO

## 2022-08-31 ENCOUNTER — Other Ambulatory Visit: Payer: BC Managed Care – PPO

## 2022-09-14 ENCOUNTER — Other Ambulatory Visit: Payer: Self-pay | Admitting: Family Medicine

## 2022-09-14 DIAGNOSIS — E1169 Type 2 diabetes mellitus with other specified complication: Secondary | ICD-10-CM

## 2022-09-14 NOTE — Telephone Encounter (Signed)
Dose was changed on 06/17/22.  Requested Prescriptions  Refused Prescriptions Disp Refills   metFORMIN (GLUCOPHAGE-XR) 750 MG 24 hr tablet [Pharmacy Med Name: METFORMIN HCL ER 750 MG TABLET] 90 tablet 1    Sig: TAKE 1 TABLET BY [place]     Endocrinology:  Diabetes - Biguanides Passed - 09/14/2022  2:35 AM      Passed - Cr in normal range and within 360 days    Creat  Date Value Ref Range Status  03/13/2021 0.88 0.70 - 1.30 mg/dL Final   Creatinine, Ser  Date Value Ref Range Status  02/11/2022 0.84 0.61 - 1.24 mg/dL Final   Creatinine, Urine  Date Value Ref Range Status  08/18/2022 248 20 - 320 mg/dL Final         Passed - HBA1C is between 0 and 7.9 and within 180 days    Hemoglobin A1C  Date Value Ref Range Status  08/18/2022 7.2 (A) 4.0 - 5.6 % Final   Hgb A1c MFr Bld  Date Value Ref Range Status  10/01/2021 7.8 (H) <5.7 % of total Hgb Final    Comment:    For someone without known diabetes, a hemoglobin A1c value of 6.5% or greater indicates that they may have  diabetes and this should be confirmed with a follow-up  test. . For someone with known diabetes, a value <7% indicates  that their diabetes is well controlled and a value  greater than or equal to 7% indicates suboptimal  control. A1c targets should be individualized based on  duration of diabetes, age, comorbid conditions, and  other considerations. . Currently, no consensus exists regarding use of hemoglobin A1c for diagnosis of diabetes for children. .          Passed - eGFR in normal range and within 360 days    GFR, Est African American  Date Value Ref Range Status  04/10/2020 103 > OR = 60 mL/min/1.66m2 Final   GFR, Est Non African American  Date Value Ref Range Status  04/10/2020 89 > OR = 60 mL/min/1.14m2 Final   GFR, Estimated  Date Value Ref Range Status  02/11/2022 >60 >60 mL/min Final    Comment:    (NOTE) Calculated using the CKD-EPI Creatinine Equation  (2021)    eGFR  Date Value Ref Range Status  03/13/2021 99 > OR = 60 mL/min/1.15m2 Final    Comment:    The eGFR is based on the CKD-EPI 2021 equation. To calculate  the new eGFR from a previous Creatinine or Cystatin C result, go to https://www.kidney.org/professionals/ kdoqi/gfr%5Fcalculator          Passed - B12 Level in normal range and within 720 days    Vitamin B-12  Date Value Ref Range Status  01/04/2022 389 200 - 1,100 pg/mL Final    Comment:    . Please Note: Although the reference range for vitamin B12 is [phone] pg/mL, it has been reported that between 5 and 10% of patients with values between 200 and 400 pg/mL may experience neuropsychiatric and hematologic abnormalities due to occult B12 deficiency; less than 1% of patients with values above 400 pg/mL will have symptoms. Renella Cunas - Valid encounter within last 6 months    Recent Outpatient Visits           3 weeks ago Type 2 diabetes mellitus with other specified complication, without long-term current use of insulin ([place])   [facility] [place], Devonne Doughty, DO   4 months ago Simple chronic bronchitis [facility])   [facility] [place], Devonne Doughty, DO   8 months ago Type 2 diabetes mellitus with other specified complication, without long-term current use of insulin [facility])   [facility] Olin Hauser, DO   11 months ago Type 2 diabetes mellitus with other specified complication, without long-term current use of insulin ([place])   [facility] [place], Devonne Doughty, DO   1 year ago Type 2 diabetes mellitus with other specified complication, without long-term current use of insulin ([place])   [facility] [place], Devonne Doughty, DO       Future Appointments             In 4 months [place], Devonne Doughty, DO [facility],  [place]   In 5 months [name], MD [place] [facility] [place]            Passed - CBC within normal limits and completed in the last 12 months    WBC  Date Value Ref Range Status  02/11/2022 7.3 4.0 - 10.5 K/uL Final   RBC  Date Value Ref Range Status  02/11/2022 4.21 (L) 4.22 - 5.81 MIL/uL Final   Hemoglobin  Date Value Ref Range Status  02/11/2022 13.2 13.0 - 17.0 g/dL Final  10/01/2021 14.9 13.0 - 17.7 g/dL Final   HCT  Date Value Ref Range Status  02/11/2022 38.9 (L) 39.0 - 52.0 % Final   Hematocrit  Date Value Ref Range Status  10/01/2021 43.3 37.5 - 51.0 % Final   MCHC  Date Value Ref Range Status  02/11/2022 33.9 30.0 - 36.0 g/dL Final   [facility]  Date Value Ref Range Status  02/11/2022 31.4 26.0 - 34.0 pg Final   MCV  Date Value Ref Range Status  02/11/2022 92.4 80.0 - 100.0 fL Final  02/01/2018 94 79 - 97 fL Final  05/04/2014 94 80 - 100 fL Final   No results found for: "PLTCOUNTKUC", "LABPLAT", "POCPLA" RDW  Date Value Ref Range Status  02/11/2022 13.2 11.5 - 15.5 % Final  02/01/2018 13.3 12.3 - 15.4 % Final  05/04/2014 12.6 11.5 - 14.5 % Final

## 2022-10-04 ENCOUNTER — Encounter: Payer: Self-pay | Admitting: Family Medicine

## 2022-12-16 ENCOUNTER — Other Ambulatory Visit: Payer: BLUE CROSS/BLUE SHIELD

## 2023-01-28 ENCOUNTER — Other Ambulatory Visit: Payer: BLUE CROSS/BLUE SHIELD

## 2023-01-31 ENCOUNTER — Encounter: Payer: BLUE CROSS/BLUE SHIELD | Admitting: Family Medicine

## 2023-02-01 ENCOUNTER — Other Ambulatory Visit: Payer: Self-pay | Admitting: Family Medicine

## 2023-02-01 DIAGNOSIS — E1169 Type 2 diabetes mellitus with other specified complication: Secondary | ICD-10-CM

## 2023-02-04 ENCOUNTER — Other Ambulatory Visit: Payer: Self-pay

## 2023-02-04 DIAGNOSIS — N138 Other obstructive and reflux uropathy: Secondary | ICD-10-CM

## 2023-02-07 ENCOUNTER — Other Ambulatory Visit: Payer: BLUE CROSS/BLUE SHIELD

## 2023-02-07 DIAGNOSIS — E559 Vitamin D deficiency, unspecified: Secondary | ICD-10-CM

## 2023-02-07 DIAGNOSIS — Z0000 Encounter for general adult medical examination without abnormal findings: Secondary | ICD-10-CM | POA: Diagnosis not present

## 2023-02-07 DIAGNOSIS — E1169 Type 2 diabetes mellitus with other specified complication: Secondary | ICD-10-CM | POA: Diagnosis not present

## 2023-02-07 DIAGNOSIS — N138 Other obstructive and reflux uropathy: Secondary | ICD-10-CM | POA: Diagnosis not present

## 2023-02-07 DIAGNOSIS — N401 Enlarged prostate with lower urinary tract symptoms: Secondary | ICD-10-CM | POA: Diagnosis not present

## 2023-02-07 DIAGNOSIS — E785 Hyperlipidemia, unspecified: Secondary | ICD-10-CM | POA: Diagnosis not present

## 2023-02-08 LAB — CBC WITH DIFFERENTIAL/PLATELET
Absolute Monocytes: 377 {cells}/uL (ref 200–950)
Basophils Absolute: 52 {cells}/uL (ref 0–200)
Basophils Relative: 0.8 %
Eosinophils Absolute: 332 {cells}/uL (ref 15–500)
Eosinophils Relative: 5.1 %
HCT: 40.2 % (ref 38.5–50.0)
Hemoglobin: 13.9 g/dL (ref 13.2–17.1)
Lymphs Abs: 2002 {cells}/uL (ref 850–3900)
MCH: 32.6 pg (ref 27.0–33.0)
MCHC: 34.6 g/dL (ref 32.0–36.0)
MCV: 94.1 fL (ref 80.0–100.0)
MPV: 11.1 fL (ref 7.5–12.5)
Monocytes Relative: 5.8 %
Neutro Abs: 3738 {cells}/uL (ref 1500–7800)
Neutrophils Relative %: 57.5 %
Platelets: 203 10*3/uL (ref 140–400)
RBC: 4.27 10*6/uL (ref 4.20–5.80)
RDW: 12.2 % (ref 11.0–15.0)
Total Lymphocyte: 30.8 %
WBC: 6.5 10*3/uL (ref 3.8–10.8)

## 2023-02-08 LAB — LIPID PANEL
Cholesterol: 121 mg/dL (ref <200)
HDL: 47 mg/dL (ref >=40)
LDL Cholesterol (Calc): 57 mg/dL
Non-HDL Cholesterol (Calc): 74 mg/dL (ref <130)
Total CHOL/HDL Ratio: 2.6 (calc) (ref <5.0)
Triglycerides: 83 mg/dL (ref <150)

## 2023-02-08 LAB — COMPLETE METABOLIC PANEL WITH GFR
AG Ratio: 1.6 (calc) (ref 1.0–2.5)
ALT: 14 U/L (ref 9–46)
AST: 13 U/L (ref 10–35)
Albumin: 4.2 g/dL (ref 3.6–5.1)
Alkaline phosphatase (APISO): 71 U/L (ref 35–144)
BUN: 12 mg/dL (ref 7–25)
CO2: 27 mmol/L (ref 20–32)
Calcium: 10.4 mg/dL — ABNORMAL HIGH (ref 8.6–10.3)
Chloride: 104 mmol/L (ref 98–110)
Creat: 0.85 mg/dL (ref 0.70–1.35)
Globulin: 2.7 g/dL (ref 1.9–3.7)
Glucose, Bld: 128 mg/dL — ABNORMAL HIGH (ref 65–99)
Potassium: 4.2 mmol/L (ref 3.5–5.3)
Sodium: 140 mmol/L (ref 135–146)
Total Bilirubin: 0.4 mg/dL (ref 0.2–1.2)
Total Protein: 6.9 g/dL (ref 6.1–8.1)
eGFR: 99 mL/min/{1.73_m2} (ref >=60)

## 2023-02-08 LAB — TSH: TSH: 0.86 m[IU]/L (ref 0.40–4.50)

## 2023-02-08 LAB — VITAMIN D 25 HYDROXY (VIT D DEFICIENCY, FRACTURES): Vit D, 25-Hydroxy: 39 ng/mL (ref 30–100)

## 2023-02-08 LAB — HEMOGLOBIN A1C
Hgb A1c MFr Bld: 6.8 %{Hb} — ABNORMAL HIGH (ref <5.7)
Mean Plasma Glucose: 148 mg/dL
eAG (mmol/L): 8.2 mmol/L

## 2023-02-08 LAB — PSA: Prostate Specific Ag, Serum: <0.1 ng/mL (ref 0.0–4.0)

## 2023-02-10 ENCOUNTER — Encounter: Payer: Self-pay | Admitting: Family Medicine

## 2023-02-10 ENCOUNTER — Ambulatory Visit (INDEPENDENT_AMBULATORY_CARE_PROVIDER_SITE_OTHER): Payer: BLUE CROSS/BLUE SHIELD | Admitting: Family Medicine

## 2023-02-10 VITALS — BP 126/68 | HR 64 | Ht 65.0 in | Wt 162.6 lb

## 2023-02-10 DIAGNOSIS — Z1211 Encounter for screening for malignant neoplasm of colon: Secondary | ICD-10-CM

## 2023-02-10 DIAGNOSIS — Z720 Tobacco use: Secondary | ICD-10-CM | POA: Diagnosis not present

## 2023-02-10 DIAGNOSIS — Z122 Encounter for screening for malignant neoplasm of respiratory organs: Secondary | ICD-10-CM | POA: Diagnosis not present

## 2023-02-10 DIAGNOSIS — Z0000 Encounter for general adult medical examination without abnormal findings: Secondary | ICD-10-CM | POA: Diagnosis not present

## 2023-02-10 LAB — HM DIABETES EYE EXAM

## 2023-02-10 NOTE — Patient Instructions (Addendum)
Thank you for coming to the office today.  Recent Labs    08/18/22 0901 02/07/23 0800  HGBA1C 7.2* 6.8*   Excellent, keep up on Ozempic 0.25mg  weekly inj  Cholesterol is in good range  Okay to keep B12 if it is helping energy.  One a Day Vitamin including Magnesium.  Vitamin D improved   Please schedule a Follow-up Appointment to: Return in about 6 months (around 08/13/2023) for 6 month DM A1c DM Foot, Urine MIcroalbumin.  If you have any other questions or concerns, please feel free to call the office or send a message through MyChart. You may also schedule an earlier appointment if necessary.  Additionally, you may be receiving a survey about your experience at our office within a few days to 1 week by e-mail or mail. We value your feedback.  [name], DO [facility] [place]
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
## 2023-02-16 ENCOUNTER — Encounter: Payer: Self-pay | Admitting: Family Medicine

## 2023-03-03 ENCOUNTER — Other Ambulatory Visit: Payer: Self-pay | Admitting: *Deleted

## 2023-03-03 ENCOUNTER — Encounter: Payer: Self-pay | Admitting: Family Medicine

## 2023-03-03 DIAGNOSIS — Z122 Encounter for screening for malignant neoplasm of respiratory organs: Secondary | ICD-10-CM

## 2023-03-03 DIAGNOSIS — F17210 Nicotine dependence, cigarettes, uncomplicated: Secondary | ICD-10-CM

## 2023-03-03 DIAGNOSIS — Z87891 Personal history of nicotine dependence: Secondary | ICD-10-CM

## 2023-03-08 ENCOUNTER — Ambulatory Visit: Payer: BC Managed Care – PPO | Admitting: Urology

## 2023-03-22 ENCOUNTER — Encounter: Payer: BLUE CROSS/BLUE SHIELD | Admitting: Family Medicine

## 2023-03-22 ENCOUNTER — Ambulatory Visit: Payer: BLUE CROSS/BLUE SHIELD | Admitting: Urology

## 2023-03-22 VITALS — BP 135/68 | HR 54 | Ht 65.0 in | Wt 164.4 lb

## 2023-03-22 DIAGNOSIS — N3945 Continuous leakage: Secondary | ICD-10-CM | POA: Diagnosis not present

## 2023-03-22 DIAGNOSIS — N5231 Erectile dysfunction following radical prostatectomy: Secondary | ICD-10-CM

## 2023-03-22 DIAGNOSIS — Z8546 Personal history of malignant neoplasm of prostate: Secondary | ICD-10-CM | POA: Diagnosis not present

## 2023-03-22 DIAGNOSIS — C61 Malignant neoplasm of prostate: Secondary | ICD-10-CM

## 2023-03-22 DIAGNOSIS — N138 Other obstructive and reflux uropathy: Secondary | ICD-10-CM
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
## 2023-03-23 ENCOUNTER — Encounter: Payer: Self-pay | Admitting: Urology

## 2023-03-23 ENCOUNTER — Ambulatory Visit
Admission: RE | Admit: 2023-03-23 | Discharge: 2023-03-23 | Disposition: A | Discharge status: Home / Self Care | Payer: BLUE CROSS/BLUE SHIELD | Source: Ambulatory Visit | Attending: Acute Care | Admitting: Acute Care

## 2023-03-23 DIAGNOSIS — F17210 Nicotine dependence, cigarettes, uncomplicated: Secondary | ICD-10-CM

## 2023-03-23 DIAGNOSIS — N5231 Erectile dysfunction following radical prostatectomy: Secondary | ICD-10-CM

## 2023-03-23 DIAGNOSIS — Z122 Encounter for screening for malignant neoplasm of respiratory organs: Secondary | ICD-10-CM | POA: Diagnosis not present

## 2023-03-23 DIAGNOSIS — Z87891 Personal history of nicotine dependence: Secondary | ICD-10-CM

## 2023-03-27 ENCOUNTER — Other Ambulatory Visit: Payer: Self-pay | Admitting: Internal Medicine

## 2023-03-28 MED ORDER — NONFORMULARY OR COMPOUNDED ITEM: 0 refills | Status: DC

## 2023-03-28 NOTE — Addendum Note (Signed)
Addended by: Consuella Lose on: 03/28/2023 08:25 AM   Modules accepted: Orders

## 2023-03-28 NOTE — Telephone Encounter (Signed)
Requested Prescriptions  Pending Prescriptions Disp Refills   ANORO ELLIPTA 62.5-25 MCG/ACT AEPB [Pharmacy Med Name: ANORO ELLIPTA 62.5-25 MCG INH] 60 each 2    Sig: INHALE 1 PUFF INTO THE LUNGS DAILY AT 6 (SIX) AM.     Pulmonology:  Combination Products Passed - 03/27/2023  1:48 PM      Passed - Valid encounter within last 12 months    Recent Outpatient Visits           1 month ago Annual physical exam   [place] [facility], Netta Neat, DO   7 months ago Type 2 diabetes mellitus with other specified complication, without long-term current use of insulin [facility])   [place] [facility] [name], DO   10 months ago Simple chronic bronchitis [facility])   [place] [facility] [place], Netta Neat, DO   1 year ago Type 2 diabetes mellitus with other specified complication, without long-term current use of insulin [facility])   [place] [facility] [place], Netta Neat, DO   1 year ago Type 2 diabetes mellitus with other specified complication, without long-term current use of insulin [facility])   [place] [facility] [place], Netta Neat, DO       Future Appointments             In 4 months Al[place] Charon, Netta Neat, DO  [facility] [place]   In 11 months Vanna Scotland, MD [facility] [place]

## 2023-03-30 NOTE — Telephone Encounter (Signed)
Per Carollee Herter ok to do this in [place] for this patient. Mrs Olafson advised and scheduled, patient's wife.

## 2023-03-30 NOTE — Telephone Encounter (Signed)
Spoke with Mrs Casaus and rescheduled appointment and patient advised.

## 2023-04-06 ENCOUNTER — Other Ambulatory Visit: Payer: Self-pay | Admitting: Acute Care

## 2023-04-06 DIAGNOSIS — Z87891 Personal history of nicotine dependence: Secondary | ICD-10-CM

## 2023-04-06 DIAGNOSIS — Z122 Encounter for screening for malignant neoplasm of respiratory organs: Secondary | ICD-10-CM

## 2023-04-07 ENCOUNTER — Encounter: Payer: Self-pay | Admitting: Family Medicine

## 2023-04-07 ENCOUNTER — Telehealth: Payer: Self-pay | Admitting: Acute Care

## 2023-04-07 DIAGNOSIS — I2510 Atherosclerotic heart disease of native coronary artery without angina pectoris: Secondary | ICD-10-CM

## 2023-04-07 DIAGNOSIS — E1169 Type 2 diabetes mellitus with other specified complication: Secondary | ICD-10-CM

## 2023-04-07 NOTE — Telephone Encounter (Signed)
Pt wife calling in to get [facility] Results

## 2023-04-11 NOTE — Addendum Note (Signed)
Addended by: [name] on: 04/11/2023 07:20 PM   Modules accepted: Orders

## 2023-04-11 NOTE — Telephone Encounter (Signed)
Called and left message for patients wife, [name] (on [place]). Advised on the Vm that the results are available through MyChart as they were sent on 10/16 and to call back if they have any questions.

## 2023-04-14 NOTE — Progress Notes (Deleted)
04/14/2023 12:35 PM  Brian Wolfe [birth year] [id number]   Referring provider: [name], DO [address] [place],  [place] [address]  Urological history: 1. Prostate cancer (favorable intermediate risk) -PSA (01/2023) <0.1 -robotic prostatectomy (12/2021)   2. Hypogonadism -TRT on hold secondary to prostate cancer  3. ED -Continue factors of age, prostate cancer, prostatectomy, diabetes, hyperlipidemia, COPD, smoking, anxiety, depression and shift work.  -failed PDE5i's   No chief complaint on file.  HPI: [name] is a 62 y.o. male who presents today for Trimix titration.    The procedure is discussed with patient.  He is allowed to ask questions.  Questions were answered to his satisfaction.  We were able to proceed to the titration.  Physical Exam:  There were no vitals taken for this visit.  Constitutional:  Well nourished. Alert and oriented, No acute distress. GU: No CVA tenderness.  No bladder fullness or masses.  Patient with circumcised/uncircumcised phallus. ***Foreskin easily retracted***  Urethral meatus is patent.  No penile discharge. No penile lesions or rashes. Scrotum without lesions, cysts, rashes and/or edema.   Psychiatric: Normal mood and affect.   Procedure *** Patient's left corpus cavernosum is identified.  An area near the base of the penis is cleansed with rubbing alcohol.  Careful to avoid the dorsal vein, *** mcg of Trimix (papaverine 30 mg, phentolamine 1 mg and prostaglandin E1 10 mcg, Lot # ***@*** exp # *** is injected at a 90 degree angle into the left *** corpus cavernosum near the base of the penis.  Patient experienced a very firm erection in 15 minutes.     Assessment & Plan:    1.  Erectile dysfunction -Advised patient of the condition of priapism, painful erection lasting for more than four hours, and to contact the office or seek treatment in the ED immediately   2. Hypogonadism -on hold  3. Prostate  cancer -recent PSA undetectable -follow up labs in March    No follow-ups on file.  Michiel Cowboy, PA-C   [facility] [address] [place], [place] [address] [phone]

## 2023-04-19 ENCOUNTER — Ambulatory Visit: Payer: BLUE CROSS/BLUE SHIELD | Admitting: Urology

## 2023-04-19 DIAGNOSIS — E291 Testicular hypofunction: Secondary | ICD-10-CM

## 2023-04-19 DIAGNOSIS — N5231 Erectile dysfunction following radical prostatectomy: Secondary | ICD-10-CM

## 2023-04-19 DIAGNOSIS — C61 Malignant neoplasm of prostate: Secondary | ICD-10-CM

## 2023-05-05 ENCOUNTER — Ambulatory Visit: Payer: BLUE CROSS/BLUE SHIELD

## 2023-05-05 ENCOUNTER — Encounter: Payer: Self-pay | Admitting: Cardiology

## 2023-05-05 ENCOUNTER — Ambulatory Visit: Payer: BLUE CROSS/BLUE SHIELD | Attending: Cardiology | Admitting: Cardiology

## 2023-05-05 VITALS — BP 142/82 | HR 59 | Ht 65.0 in | Wt 163.4 lb

## 2023-05-05 DIAGNOSIS — F17200 Nicotine dependence, unspecified, uncomplicated: Secondary | ICD-10-CM | POA: Diagnosis not present

## 2023-05-05 DIAGNOSIS — E7800 Pure hypercholesterolemia, unspecified: Secondary | ICD-10-CM | POA: Diagnosis not present

## 2023-05-05 DIAGNOSIS — I2510 Atherosclerotic heart disease of native coronary artery without angina pectoris: Secondary | ICD-10-CM

## 2023-05-05 DIAGNOSIS — R0609 Other forms of dyspnea: Secondary | ICD-10-CM
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
## 2023-05-05 NOTE — Patient Instructions (Signed)
Medication Instructions:   Your physician recommends that you continue on your current medications as directed. Please refer to the Current Medication list given to you today.  *If you need a refill on your cardiac medications before your next appointment, please call your pharmacy*   Lab Work:  None ordered  If you have labs (blood work) drawn today and your tests are completely normal, you will receive your results only by: MyChart Message (if you have MyChart) OR A paper copy in the mail If you have any lab test that is abnormal or we need to change your treatment, we will call you to review the results.   Testing/Procedure  Your physician has requested that you have an echocardiogram. Echocardiography is a painless test that uses sound waves to create images of your heart. It provides your doctor with information about the size and shape of your heart and how well your heart's chambers and valves are working. This procedure takes approximately one hour. There are no restrictions for this procedure. Please do NOT wear cologne, perfume, aftershave, or lotions (deodorant is allowed). Please arrive 15 minutes prior to your appointment time.  Please note: We ask at that you not bring children with you during ultrasound (echo/ vascular) testing. Due to room size and safety concerns, children are not allowed in the ultrasound rooms during exams. Our front office staff cannot provide observation of children in our lobby area while testing is being conducted. An adult accompanying a patient to their appointment will only be allowed in the ultrasound room at the discretion of the ultrasound technician under special circumstances. We apologize for any inconvenience.  [facility] MYOVIEW  Your Provider has ordered a Stress Test with nuclear imaging. The purpose of this test is to evaluate the blood supply to your heart muscle. This procedure is referred to as a "Non-Invasive Stress Test." This is because  other than having an IV started in your vein, nothing is inserted or "invades" your body. Cardiac stress tests are done to find areas of poor blood flow to the heart by determining the extent of coronary artery disease (CAD). Some patients exercise on a treadmill, which naturally increases the blood flow to your heart, while others who are unable to walk on a treadmill due to physical limitations have a pharmacologic/chemical stress agent called Lexiscan. This medicine will mimic walking on a treadmill by temporarily increasing your coronary blood flow.     REPORT TO [facility] MEDICAL MALL ENTRANCE  **Proceed to the 1st desk on the right, REGISTRATION, to check in**  Please note: this test may take anywhere between 2-4 hours to complete    Instructions regarding medication:   How to prepare for your Myoview test:  Do not eat or drink for 6 hours prior to the test No caffeine for 24 hours prior to the test No smoking 24 hours prior to the test. Ladies, please do not wear dresses.  Skirts or pants are appropriate. Please wear a short sleeve shirt. No perfume, cologne or lotion. Wear comfortable walking shoes. No heels!   PLEASE NOTIFY THE OFFICE AT LEAST 24 HOURS IN ADVANCE IF YOU ARE UNABLE TO KEEP YOUR APPOINTMENT.  [phone] AND  PLEASE NOTIFY NUCLEAR MEDICINE AT [facility] AT LEAST 24 HOURS IN ADVANCE IF YOU ARE UNABLE TO KEEP YOUR APPOINTMENT. [phone]       Follow-Up: At [facility], you and your health needs are our priority.  As part of our continuing mission to provide you with  exceptional heart care, we have created designated Provider Care Teams.  These Care Teams include your primary Cardiologist (physician) and Advanced Practice Providers (APPs -  Physician Assistants and Nurse Practitioners) who all work together to provide you with the care you need, when you need it.  We recommend signing up for the patient portal called "MyChart".  Sign up information is provided  on this After Visit Summary.  MyChart is used to connect with patients for Virtual Visits (Telemedicine).  Patients are able to view lab/test results, encounter notes, upcoming appointments, etc.  Non-urgent messages can be sent to your provider as well.   To learn more about what you can do with MyChart, go to [email or web address].    Your next appointment:    After Cardiac testing  Provider:   You may see [name], MD or one of the following Advanced Practice Providers on your designated Care Team:   Nicolasa Ducking, NP Eula Listen, PA-C Cadence Fransico Michael, PA-C Charlsie Quest, NP Carlos Levering, NP

## 2023-05-06 LAB — ECHOCARDIOGRAM COMPLETE
AR max vel: 2.9 cm2
AV Area VTI: 2.9 cm2
AV Area mean vel: 2.93 cm2
AV Mean grad: 4 mm[Hg]
AV Peak grad: 7.6 mm[Hg]
Ao pk vel: 1.38 m/s
Area-P 1/2: 3.48 cm2
Calc EF: 52.1 %
Height: 65 in
S' Lateral: 3.4 cm
Single Plane A2C EF: 50.5 %
Single Plane A4C EF: 50.9 %
Weight: 2614.4 [oz_av]

## 2023-05-09 ENCOUNTER — Ambulatory Visit: Payer: BLUE CROSS/BLUE SHIELD | Admitting: Urology

## 2023-05-30 ENCOUNTER — Ambulatory Visit: Payer: BLUE CROSS/BLUE SHIELD | Admitting: Internal Medicine

## 2023-06-07 ENCOUNTER — Encounter: Payer: Self-pay | Admitting: Cardiology

## 2023-06-10 ENCOUNTER — Encounter: Admission: RE | Admit: 2023-06-10 | Payer: BLUE CROSS/BLUE SHIELD | Source: Ambulatory Visit

## 2023-06-13 ENCOUNTER — Ambulatory Visit: Payer: BLUE CROSS/BLUE SHIELD | Admitting: Student

## 2023-06-18 ENCOUNTER — Other Ambulatory Visit: Payer: Self-pay | Admitting: Family Medicine

## 2023-06-18 DIAGNOSIS — E1169 Type 2 diabetes mellitus with other specified complication: Secondary | ICD-10-CM

## 2023-06-23 NOTE — Telephone Encounter (Signed)
 Requested medication (s) are due for refill today: Yes  Requested medication (s) are on the active medication list: Yes  Last refill:  03/22/23  Future visit scheduled: Yes  Notes to clinic:  Unable to refill per protocol, last refill by another provider.      Requested Prescriptions  Pending Prescriptions Disp Refills   metFORMIN  (GLUCOPHAGE -XR) 750 MG 24 hr tablet [Pharmacy Med Name: METFORMIN  HCL ER 750 MG TABLET] 180 tablet 1    Sig: TAKE 2 TABLETS BY MOUTH EVERY DAY WITH BREAKFAST     Endocrinology:  Diabetes - Biguanides Passed - 06/23/2023 10:45 AM      Passed - Cr in normal range and within 360 days    Creat  Date Value Ref Range Status  02/07/2023 0.85 0.70 - 1.35 mg/dL Final   Creatinine, Urine  Date Value Ref Range Status  08/18/2022 248 20 - 320 mg/dL Final         Passed - HBA1C is between 0 and 7.9 and within 180 days    Hgb A1c MFr Bld  Date Value Ref Range Status  02/07/2023 6.8 (H) <5.7 % of total Hgb Final    Comment:    For someone without known diabetes, a hemoglobin A1c value of 6.5% or greater indicates that they may have  diabetes and this should be confirmed with a follow-up  test. . For someone with known diabetes, a value <7% indicates  that their diabetes is well controlled and a value  greater than or equal to 7% indicates suboptimal  control. A1c targets should be individualized based on  duration of diabetes, age, comorbid conditions, and  other considerations. . Currently, no consensus exists regarding use of hemoglobin A1c for diagnosis of diabetes for children. .          Passed - eGFR in normal range and within 360 days    GFR, Est African American  Date Value Ref Range Status  04/10/2020 103 > OR = 60 mL/min/1.9m2 Final   GFR, Est Non African American  Date Value Ref Range Status  04/10/2020 89 > OR = 60 mL/min/1.57m2 Final   GFR, Estimated  Date Value Ref Range Status  02/11/2022 >60 >60 mL/min Final    Comment:     (NOTE) Calculated using the CKD-EPI Creatinine Equation (2021)    eGFR  Date Value Ref Range Status  02/07/2023 99 > OR = 60 mL/min/1.37m2 Final         Passed - B12 Level in normal range and within 720 days    Vitamin B-12  Date Value Ref Range Status  01/04/2022 389 200 - 1,100 pg/mL Final    Comment:    . Please Note: Although the reference range for vitamin B12 is [phone] pg/mL, it has been reported that between 5 and 10% of patients with values between 200 and 400 pg/mL may experience neuropsychiatric and hematologic abnormalities due to occult B12 deficiency; less than 1% of patients with values above 400 pg/mL will have symptoms. SABRA Amy - Valid encounter within last 6 months    Recent Outpatient Visits           4 months ago Annual physical exam   [place] [facility] [place], Marsa PARAS, DO   10 months ago Type 2 diabetes mellitus with other specified complication, without long-term current use of insulin  [facility])   [place] [facility] [place], Marsa PARAS, [place]  1 year ago Simple chronic bronchitis (HCC)   [place] [facility] [place], Marsa PARAS, DO   1 year ago Type 2 diabetes mellitus with other specified complication, without long-term current use of insulin  [facility])   [place] [facility] [place], Marsa PARAS, DO   1 year ago Type 2 diabetes mellitus with other specified complication, without long-term current use of insulin  [facility])   [place] [facility] [place], Marsa PARAS, DO       Future Appointments             In 3 weeks Loistine, Barnie, NP [facility] [place]   In 1 month [place], Marsa PARAS, DO  [facility]   In 9 months Penne Knee, MD [facility] [place]            Passed - CBC within normal limits and completed in the last 12 months    WBC  Date  Value Ref Range Status  02/07/2023 6.5 3.8 - 10.8 Thousand/uL Final   RBC  Date Value Ref Range Status  02/07/2023 4.27 4.20 - 5.80 Million/uL Final   Hemoglobin  Date Value Ref Range Status  02/07/2023 13.9 13.2 - 17.1 g/dL Final  95/86/7976 85.0 13.0 - 17.7 g/dL Final   HCT  Date Value Ref Range Status  02/07/2023 40.2 38.5 - 50.0 % Final   Hematocrit  Date Value Ref Range Status  10/01/2021 43.3 37.5 - 51.0 % Final   MCHC  Date Value Ref Range Status  02/07/2023 34.6 32.0 - 36.0 g/dL Final   [facility]  Date Value Ref Range Status  02/07/2023 32.6 27.0 - 33.0 pg Final   MCV  Date Value Ref Range Status  02/07/2023 94.1 80.0 - 100.0 fL Final  02/01/2018 94 79 - 97 fL Final  05/04/2014 94 80 - 100 fL Final   No results found for: PLTCOUNTKUC, LABPLAT, POCPLA RDW  Date Value Ref Range Status  02/07/2023 12.2 11.0 - 15.0 % Final  02/01/2018 13.3 12.3 - 15.4 % Final  05/04/2014 12.6 11.5 - 14.5 % Final

## 2023-06-24 ENCOUNTER — Encounter: Payer: Self-pay | Admitting: Family Medicine

## 2023-07-09 ENCOUNTER — Other Ambulatory Visit: Payer: Self-pay | Admitting: Family Medicine

## 2023-07-09 DIAGNOSIS — K219 Gastro-esophageal reflux disease without esophagitis: Secondary | ICD-10-CM

## 2023-07-11 NOTE — Telephone Encounter (Signed)
Requested Prescriptions  Pending Prescriptions Disp Refills   omeprazole (PRILOSEC) 20 MG capsule [Pharmacy Med Name: OMEPRAZOLE DR 20 MG CAPSULE] 90 capsule 1    Sig: TAKE 1 CAPSULE BY MOUTH EVERY DAY BEFORE BREAKFAST     Gastroenterology: Proton Pump Inhibitors Passed - 07/11/2023 10:14 AM      Passed - Valid encounter within last 12 months    Recent Outpatient Visits           5 months ago Annual physical exam   [place] [facility] [place], Netta Neat, DO   10 months ago Type 2 diabetes mellitus with other specified complication, without long-term current use of insulin [facility])   [place] [facility] [name], DO   1 year ago Simple chronic bronchitis [facility])   [place] [facility] [name], DO   1 year ago Type 2 diabetes mellitus with other specified complication, without long-term current use of insulin [facility])   [place] [facility] [place], Netta Neat, DO   1 year ago Type 2 diabetes mellitus with other specified complication, without long-term current use of insulin [facility])   [place] [facility], Netta Neat, DO       Future Appointments             In 1 week Daine Floras, Gavin Pound, NP [facility] [place]   In 1 month Althea Charon, Netta Neat, DO [place] [facility]   In 8 months Vanna Scotland, MD [facility]

## 2023-07-18 ENCOUNTER — Ambulatory Visit: Payer: BLUE CROSS/BLUE SHIELD | Admitting: Student

## 2023-07-27 ENCOUNTER — Encounter: Payer: Self-pay | Admitting: Family Medicine

## 2023-07-27 ENCOUNTER — Other Ambulatory Visit: Payer: Self-pay

## 2023-07-27 DIAGNOSIS — E119 Type 2 diabetes mellitus without complications: Secondary | ICD-10-CM

## 2023-07-27 MED ORDER — BLOOD GLUCOSE METER KIT: PACK | 0 refills | Status: DC

## 2023-07-27 MED ORDER — BLOOD GLUCOSE METER KIT: PACK | 0 refills | Status: AC

## 2023-07-27 MED ORDER — LANCET DEVICE MISC: 1.0000 | Freq: Three times a day (TID) | 0 refills | Status: AC

## 2023-07-27 MED ORDER — BLOOD GLUCOSE MONITORING SUPPL DEVI: 1.0000 | Freq: Three times a day (TID) | 0 refills | Status: AC

## 2023-07-27 MED ORDER — BLOOD GLUCOSE TEST VI STRP: 1.0000 | ORAL_STRIP | Freq: Three times a day (TID) | 0 refills | Status: AC

## 2023-07-27 MED ORDER — LANCETS MISC. MISC: 1.0000 | Freq: Three times a day (TID) | 0 refills | Status: AC

## 2023-07-29 ENCOUNTER — Other Ambulatory Visit: Payer: Managed Care, Other (non HMO)

## 2023-08-01 ENCOUNTER — Ambulatory Visit: Payer: Self-pay | Admitting: Student

## 2023-08-01 ENCOUNTER — Ambulatory Visit: Payer: Self-pay | Admitting: Family Medicine

## 2023-08-01 ENCOUNTER — Encounter
Admission: RE | Admit: 2023-08-01 | Discharge: 2023-08-01 | Disposition: A | Discharge status: Home / Self Care | Payer: Managed Care, Other (non HMO) | Source: Ambulatory Visit | Attending: Cardiology | Admitting: Cardiology

## 2023-08-01 DIAGNOSIS — R0609 Other forms of dyspnea: Secondary | ICD-10-CM | POA: Insufficient documentation

## 2023-08-01 MED ORDER — TECHNETIUM TC 99M TETROFOSMIN IV KIT
32.1000 | PACK | Freq: Once | INTRAVENOUS | Status: AC | PRN
  Administered 2023-08-01: 32.1 via INTRAVENOUS

## 2023-08-01 MED ORDER — REGADENOSON 0.4 MG/5ML IV SOLN
0.4000 mg | Freq: Once | INTRAVENOUS | Status: AC
  Administered 2023-08-01: 0.4 mg via INTRAVENOUS
  Filled 2023-08-01: qty 5

## 2023-08-01 MED ORDER — TECHNETIUM TC 99M TETROFOSMIN IV KIT
10.9100 | PACK | Freq: Once | INTRAVENOUS | Status: AC | PRN
  Administered 2023-08-01: 10.91 via INTRAVENOUS

## 2023-08-02 ENCOUNTER — Ambulatory Visit: Payer: Managed Care, Other (non HMO) | Admitting: Family Medicine

## 2023-08-02 ENCOUNTER — Encounter: Payer: Self-pay | Admitting: Cardiology

## 2023-08-02 VITALS — BP 132/78 | HR 60 | Ht 65.0 in | Wt 162.0 lb

## 2023-08-02 DIAGNOSIS — F32A Depression, unspecified: Secondary | ICD-10-CM | POA: Diagnosis not present

## 2023-08-02 DIAGNOSIS — E1169 Type 2 diabetes mellitus with other specified complication: Secondary | ICD-10-CM | POA: Diagnosis not present

## 2023-08-02 DIAGNOSIS — J0110 Acute frontal sinusitis, unspecified: Secondary | ICD-10-CM

## 2023-08-02 DIAGNOSIS — F419 Anxiety disorder, unspecified: Secondary | ICD-10-CM | POA: Diagnosis not present

## 2023-08-02 DIAGNOSIS — Z7985 Long-term (current) use of injectable non-insulin antidiabetic drugs: Secondary | ICD-10-CM

## 2023-08-02 LAB — NM MYOCAR MULTI W/SPECT W/WALL MOTION / EF
LV dias vol: 122 mL (ref 62–150)
LV sys vol: 47 mL
Nuc Stress EF: 61 %
Peak HR: 91 {beats}/min
Rest HR: 59 {beats}/min
Rest Nuclear Isotope Dose: 10.9 mCi
SDS: 2
SRS: 3
SSS: 0
ST Depression (mm): 0 mm
Stress Nuclear Isotope Dose: 32.1 mCi
TID: 1.09

## 2023-08-02 LAB — POCT GLYCOSYLATED HEMOGLOBIN (HGB A1C): Hemoglobin A1C: 6.3 % — AB (ref 4.0–5.6)

## 2023-08-02 MED ORDER — DOXYCYCLINE HYCLATE 100 MG PO TABS: 100.0000 mg | ORAL_TABLET | Freq: Two times a day (BID) | ORAL | 0 refills | Status: DC

## 2023-08-02 NOTE — Progress Notes (Unsigned)
Subjective:    Patient ID: Brian Wolfe, male    DOB: 01/21/1961, 63 y.o.   MRN: [id number]  Brian Wolfe is a 63 y.o. male presenting on 08/02/2023 for No chief complaint on file.   HPI  Discussed the use of AI scribe software for clinical note transcription with the patient, who gave verbal consent to proceed.  History of Present Illness          Type 2 Diabetes ***Improved, prefer low dose Ozempic  Last A1c 6.3 Higher CBG 220+ in past 30 days, often 120-130 Improved diet lifestyle Current meds Ozempic 0.25mg  weekly Metformin XR 750mg  x 1 daily Upcoming Diabetic Eye A[facility] [name] Today   Hypogonadism Followed by Urology Previously on Testosterone   BPH LUTS / History prostate cancer S/p Prostatectomy 12/2021 Followed by Urology Prior trial on medication management On Trospium 60mg  24 hr daily currently Worsening side effects dry eye dry mouth and heartburn   Prostate Cancer S/p upcoming prostatectomy robotic, scheduled Lab < 0.1 (01/2023)   History 1995, issue with cervical fusion surgery He still has some episodic symptoms He was advised that as a smoker surgery may not last long. It has lasted 30 yrs Has some ulnar symptoms but doing well  Sinusitis Recent worsening, *** - ***6 weeks   Health Maintenance: ***     02/10/2023    9:03 AM 10/19/2021   10:20 AM 09/03/2021    8:59 AM  Depression screen PHQ 2/9  Decreased Interest 0 1 1  Down, Depressed, Hopeless 0 1 0  PHQ - 2 Score 0 2 1  Altered sleeping 1 0 3  Tired, decreased energy 1 2 2   Change in appetite 0 2 0  Feeling bad or failure about yourself  0 0 1  Trouble concentrating 0 0 0  Moving slowly or fidgety/restless 0 0 1  Suicidal thoughts 0 0 0  PHQ-9 Score 2 6 8   Difficult doing work/chores Not difficult at all Not difficult at all Not difficult at all       02/10/2023    9:03 AM 10/19/2021   10:20 AM 09/03/2021    9:00 AM 03/13/2021    8:04 AM  GAD 7 : Generalized Anxiety Score   Nervous, Anxious, on Edge 0 1 2 1   Control/stop worrying 0 0 2 1  Worry too much - different things 0 0 2 0  Trouble relaxing 1 1 2 1   Restless 0 0 2 1  Easily annoyed or irritable 0 0 2 0  Afraid - awful might happen 0 0 1 0  Total GAD 7 Score 1 2 13 4   Anxiety Difficulty Not difficult at all Not difficult at all Not difficult at all Not difficult at all    Social History   Tobacco Use   Smoking status: Every Day    Current packs/day: 1.00    Average packs/day: 1 pack/day for 40.0 years (40.0 ttl pk-yrs)    Types: Cigarettes   Smokeless tobacco: Never  Vaping Use   Vaping status: Never Used  Substance Use Topics   Alcohol use: No    Alcohol/week: 0.0 standard drinks of alcohol   Drug use: No    Review of Systems Per HPI unless specifically indicated above     Objective:    BP 132/78   Pulse 60   Ht 5\' 5"  (1.651 m)   Wt 162 lb (73.5 kg)   SpO2 97%   BMI 26.96 kg/m   Wt  Readings from Last 3 Encounters:  08/02/23 162 lb (73.5 kg)  05/05/23 163 lb 6.4 oz (74.1 kg)  03/22/23 164 lb 6 oz (74.6 kg)    Physical Exam  Results for orders placed or performed in visit on 08/02/23  POCT HgB A1C   Collection Time: 08/02/23  8:06 AM  Result Value Ref Range   Hemoglobin A1C 6.3 (A) 4.0 - 5.6 %   HbA1c POC (<> result, manual entry)     HbA1c, POC (prediabetic range)     HbA1c, POC (controlled diabetic range)        Assessment & Plan:   Problem List Items Addressed This Visit     Anxiety and depression   Type 2 diabetes mellitus with other specified complication (HCC) - Primary   Relevant Orders   POCT HgB A1C (Completed)   Other Visit Diagnoses       Acute non-recurrent frontal sinusitis       Relevant Medications   doxycycline (VIBRA-TABS) 100 MG tablet        Assessment and Plan              Orders Placed This Encounter  Procedures   POCT HgB A1C    Meds ordered this encounter  Medications   doxycycline (VIBRA-TABS) 100 MG tablet    Sig:  Take 1 tablet (100 mg total) by mouth 2 (two) times daily. For 10 days. Take with full glass of water, stay upright 30 min after taking.    Dispense:  20 tablet    Refill:  0    Follow up plan: Return in about 6 months (around 01/30/2024) for 6 month fasting lab > 1 week later Annual Physical.  Future labs ordered for ***  ***  [name], DO [facility] 08/02/2023, 8:12 AM

## 2023-08-02 NOTE — Patient Instructions (Addendum)
Thank you for coming to the office today.  Recent Labs    08/18/22 0901 02/07/23 0800 08/02/23 0806  HGBA1C 7.2* 6.8* 6.3*   Keep on current medications with Ozempic 0.25mg  weekly + Metformin XR 750  Notify us with the correct Mail Order company for the 90 day refills and confirm which meds to be sent.  DUE for FASTING BLOOD WORK (no food or drink after midnight before the lab appointment, only water or coffee without cream/sugar on the morning of)  SCHEDULE "Lab Only" visit in the morning at the clinic for lab draw in 6 MONTHS   - Make sure Lab Only appointment is at about 1 week before your next appointment, so that results will be available  For Lab Results, once available within 2-3 days of blood draw, you can can log in to MyChart online to view your results and a brief explanation. Also, we can discuss results at next follow-up visit.   Please schedule a Follow-up Appointment to: Return in about 6 months (around 01/30/2024) for 6 month fasting lab > 1 week later Annual Physical.  If you have any other questions or concerns, please feel free to call the office or send a message through MyChart. You may also schedule an earlier appointment if necessary.  Additionally, you may be receiving a survey about your experience at our office within a few days to 1 week by e-mail or mail. We value your feedback.  [name], DO [facility], [place]

## 2023-08-03 ENCOUNTER — Other Ambulatory Visit: Payer: Self-pay | Admitting: Family Medicine

## 2023-08-03 ENCOUNTER — Encounter: Payer: Self-pay | Admitting: Family Medicine

## 2023-08-03 DIAGNOSIS — E538 Deficiency of other specified B group vitamins: Secondary | ICD-10-CM

## 2023-08-03 DIAGNOSIS — I2510 Atherosclerotic heart disease of native coronary artery without angina pectoris: Secondary | ICD-10-CM

## 2023-08-03 DIAGNOSIS — E559 Vitamin D deficiency, unspecified: Secondary | ICD-10-CM

## 2023-08-03 DIAGNOSIS — E1169 Type 2 diabetes mellitus with other specified complication: Secondary | ICD-10-CM

## 2023-08-03 DIAGNOSIS — Z0000 Encounter for general adult medical examination without abnormal findings: Secondary | ICD-10-CM

## 2023-08-12 ENCOUNTER — Other Ambulatory Visit: Payer: Managed Care, Other (non HMO)

## 2023-08-15 ENCOUNTER — Ambulatory Visit: Payer: Self-pay | Admitting: Student

## 2023-08-15 ENCOUNTER — Ambulatory Visit: Payer: BLUE CROSS/BLUE SHIELD | Admitting: Family Medicine

## 2023-08-20 ENCOUNTER — Other Ambulatory Visit: Payer: Self-pay | Admitting: Family Medicine

## 2023-08-22 NOTE — Telephone Encounter (Signed)
 Requested Prescriptions  Pending Prescriptions Disp Refills   ANORO ELLIPTA 62.5-25 MCG/ACT AEPB [Pharmacy Med Name: ANORO ELLIPTA 62.5-25 MCG INH] 60 each 2    Sig: INHALE 1 PUFF INTO THE LUNGS DAILY AT 6 (SIX) AM.     Pulmonology:  Combination Products Passed - 08/22/2023  2:34 PM      Passed - Valid encounter within last 12 months    Recent Outpatient Visits           6 months ago Annual physical exam   [place] [facility] [place], Netta Neat, DO   1 year ago Type 2 diabetes mellitus with other specified complication, without long-term current use of insulin [facility])   [place] [facility] [name], DO   1 year ago Simple chronic bronchitis [facility])   [place] [facility] [name], DO   1 year ago Type 2 diabetes mellitus with other specified complication, without long-term current use of insulin [facility])   [place] [facility] [place], Netta Neat, DO   1 year ago Type 2 diabetes mellitus with other specified complication, without long-term current use of insulin [facility])   [place] [facility] [place], Netta Neat, DO       Future Appointments             In 1 week Daine Floras, Gavin Pound, NP [facility] [place]   In 5 months Althea Charon, Netta Neat, DO [place] [facility]   In 7 months Vanna Scotland, MD [facility]

## 2023-09-01 NOTE — H&P (View-Only) (Signed)
 Cardiology Clinic Note   Date: 09/02/2023 ID: Brian Wolfe, DOB [birth year], MRN [id number]  Primary Cardiologist:  [name], MD  Chief Complaint   Brian Wolfe is a 63 y.o. male who presents to the clinic today for follow-up after testing.  Patient Profile   Brian Wolfe is followed by Dr. Azucena Cecil for the history outlined below.      Past medical history significant for: Coronary artery calcification. Nuclear stress test 08/01/2023: Abnormal probably low risk stress test.  Moderate in size, moderate in severity nearly completely reversible defect involving the apical anterior, apical lateral, and apical segments most consistent with ischemia but cannot rule out artifact.  Normal LV function.  Coronary artery calcifications noted as well as aortic atherosclerosis and splenic artery calcifications. Dyspnea. Echo 05/05/2023: EF 55 to 60%.  No RWMA.  Normal diastolic parameters.  Normal RV size/function.  Normal PA pressure, RVSP 30.2 mmHg.  No significant valvular abnormalities.  Borderline dilatation of aortic root 37 mm.   Hyperlipidemia. T2DM. Emphysema. Prostate cancer. Nonalcoholic fatty liver disease. GERD. Tobacco abuse.  In summary, patient was first evaluated by Dr. Azucena Cecil on 05/05/2023 for coronary artery calcification seen on CT at the request of Dr. Althea Charon. Patient underwent CT chest for lung cancer screening in October 2024 and showed three-vessel coronary artery calcifications. He reported DOE and occasional nonspecific chest discomfort.  He was started on aspirin.  Nuclear stress testing February 2025 was suspicious for ischemia as detailed above.     History of Present Illness    Today, patient is accompanied by his wife and daughter.  He reports continued episodes of sharp chest pain when he is very tired.  He has dyspnea on exertion that he feels it was getting worse over the last year or more.  His wife reports he used to be able to do  more outside in the yard and now seems to give out a lot quicker.  He denies lower extremity edema, orthopnea, PND.  He is a heavy smoker and will smoke 1 to 1-1/2 packs/day.  He reports of strong family history of CAD in his mother and brother.  He is uncertain of his father's medical history.  Discussed the results of stress test.  Given continued symptoms discussed further evaluation with LHC.  Patient is reluctant but after discussion with his family agrees to schedule LHC next month when he is on vacation.  Upon further discussion with patient he admits to more frequent episodes of chest pain that will radiate up his neck and down his left arm.  These episodes can last up to 10 minutes.  During examination he reports active sharp chest pain that lasted several minutes and resolved on its own.  Given this new information patient agrees to [facility] next week.    ROS: All other systems reviewed and are otherwise negative except as noted in History of Present Illness.  EKGs/Labs Reviewed    EKG Interpretation Date/Time:  Friday September 02 2023 10:11:39 EDT Ventricular Rate:  58 PR Interval:  184 QRS Duration:  96 QT Interval:  416 QTC Calculation: 408 R Axis:   -18  Text Interpretation: Sinus bradycardia When compared with ECG of 05-May-2023 10:36, No significant change was found Confirmed by Carlos Levering [phone]) on 09/02/2023 10:18:41 AM   02/07/2023: ALT 14; AST 13; BUN 12; Creat 0.85; Potassium 4.2; Sodium 140   02/07/2023: Hemoglobin 13.9; WBC 6.5   02/07/2023: TSH 0.86    Physical Exam  VS:  BP 122/70   Pulse 64   Ht 5\' 5"  (1.651 m)   Wt 163 lb (73.9 kg)   SpO2 97%   BMI 27.12 kg/m  , BMI Body mass index is 27.12 kg/m.  GEN: Well nourished, well developed, in no acute distress. Neck: No JVD or carotid bruits. Cardiac:  RRR. No murmurs. No rubs or gallops.   Respiratory:  Respirations regular and unlabored. Clear to auscultation without rales, wheezing or rhonchi. GI: Soft,  nontender, nondistended. Extremities: Radials/DP/PT 2+ and equal bilaterally. No clubbing or cyanosis. No edema.  Skin: Warm and dry, no rash. Neuro: Strength intact.  Assessment & Plan   Coronary artery calcification/chest pain Seen on chest CT in October 2024.  Nuclear stress test February 2025 was an abnormal likely low risk stress test showing moderate in size, moderate in severity nearly completely reversible defect involving the apical anterior, apical lateral, and apical segments most consistent with ischemia but cannot rule out artifact.  Patient reports episodes of chest pain.  It appears he has to different types of chest pain.  He experiences focal left-sided sharp chest pain when he is very tired.  He also describes left-sided chest pain that radiates up his neck and down his left arm that can occur with and without exertion and lasts up to 10 minutes.  EKG shows sinus bradycardia, 58 bpm. -Schedule LHC for next week. -Patient is instructed to skip dose of Ozempic on Sunday. -Rx as needed SL NTG. -Continue atorvastatin, aspirin. -ED precautions provided for patient and family.  Dyspnea Echo November 2024 showed normal LV/RV function, normal diastolic parameters, normal PA pressure, no significant valvular abnormalities.  Patient reports increasing dyspnea over the last year or so.  His wife feels he gives out a lot sooner secondary to dyspnea when doing activities in the yard.  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  He continues to smoke. -Scheduling LHC as above.  Tobacco abuse Patient patient smokes 1-1-1/2 packs of cigarettes per day.  He smokes less when he is at home as he does not smoke in his home or personal vehicles. -Smoking cessation encouraged.  Disposition: CBC and BMP today.  Schedule LHC for next week.  Return in 2 weeks.     Informed  Consent   Shared Decision Making/Informed Consent The risks [stroke (1 in 1000), death (1 in 1000), kidney failure [usually temporary] (1 in 500), bleeding (1 in 200), allergic reaction [possibly serious] (1 in 200)], benefits (diagnostic support and management of coronary artery disease) and alternatives of a cardiac catheterization were discussed in detail with Mr. Fullen and he is willing to proceed.      Signed, Etta Grandchild. Shlonda Dolloff, DNP, NP-C

## 2023-09-01 NOTE — Progress Notes (Unsigned)
 Cardiology Clinic Note   Date: 09/02/2023 ID: Brian Wolfe, DOB [birth year], MRN [id number]  Primary Cardiologist:  [name], MD  Chief Complaint   Brian Wolfe is a 63 y.o. male who presents to the clinic today for follow-up after testing.  Patient Profile   Brian Wolfe is followed by Dr. Azucena Cecil for the history outlined below.      Past medical history significant for: Coronary artery calcification. Nuclear stress test 08/01/2023: Abnormal probably low risk stress test.  Moderate in size, moderate in severity nearly completely reversible defect involving the apical anterior, apical lateral, and apical segments most consistent with ischemia but cannot rule out artifact.  Normal LV function.  Coronary artery calcifications noted as well as aortic atherosclerosis and splenic artery calcifications. Dyspnea. Echo 05/05/2023: EF 55 to 60%.  No RWMA.  Normal diastolic parameters.  Normal RV size/function.  Normal PA pressure, RVSP 30.2 mmHg.  No significant valvular abnormalities.  Borderline dilatation of aortic root 37 mm.   Hyperlipidemia. T2DM. Emphysema. Prostate cancer. Nonalcoholic fatty liver disease. GERD. Tobacco abuse.  In summary, patient was first evaluated by Dr. Azucena Cecil on 05/05/2023 for coronary artery calcification seen on CT at the request of Dr. Althea Charon. Patient underwent CT chest for lung cancer screening in October 2024 and showed three-vessel coronary artery calcifications. He reported DOE and occasional nonspecific chest discomfort.  He was started on aspirin.  Nuclear stress testing February 2025 was suspicious for ischemia as detailed above.     History of Present Illness    Today, patient is accompanied by his wife and daughter.  He reports continued episodes of sharp chest pain when he is very tired.  He has dyspnea on exertion that he feels it was getting worse over the last year or more.  His wife reports he used to be able to do  more outside in the yard and now seems to give out a lot quicker.  He denies lower extremity edema, orthopnea, PND.  He is a heavy smoker and will smoke 1 to 1-1/2 packs/day.  He reports of strong family history of CAD in his mother and brother.  He is uncertain of his father's medical history.  Discussed the results of stress test.  Given continued symptoms discussed further evaluation with LHC.  Patient is reluctant but after discussion with his family agrees to schedule LHC next month when he is on vacation.  Upon further discussion with patient he admits to more frequent episodes of chest pain that will radiate up his neck and down his left arm.  These episodes can last up to 10 minutes.  During examination he reports active sharp chest pain that lasted several minutes and resolved on its own.  Given this new information patient agrees to [facility] next week.    ROS: All other systems reviewed and are otherwise negative except as noted in History of Present Illness.  EKGs/Labs Reviewed    EKG Interpretation Date/Time:  Friday September 02 2023 10:11:39 EDT Ventricular Rate:  58 PR Interval:  184 QRS Duration:  96 QT Interval:  416 QTC Calculation: 408 R Axis:   -18  Text Interpretation: Sinus bradycardia When compared with ECG of 05-May-2023 10:36, No significant change was found Confirmed by Carlos Levering [phone]) on 09/02/2023 10:18:41 AM   02/07/2023: ALT 14; AST 13; BUN 12; Creat 0.85; Potassium 4.2; Sodium 140   02/07/2023: Hemoglobin 13.9; WBC 6.5   02/07/2023: TSH 0.86    Physical Exam  VS:  BP 122/70   Pulse 64   Ht 5\' 5"  (1.651 m)   Wt 163 lb (73.9 kg)   SpO2 97%   BMI 27.12 kg/m  , BMI Body mass index is 27.12 kg/m.  GEN: Well nourished, well developed, in no acute distress. Neck: No JVD or carotid bruits. Cardiac:  RRR. No murmurs. No rubs or gallops.   Respiratory:  Respirations regular and unlabored. Clear to auscultation without rales, wheezing or rhonchi. GI: Soft,  nontender, nondistended. Extremities: Radials/DP/PT 2+ and equal bilaterally. No clubbing or cyanosis. No edema.  Skin: Warm and dry, no rash. Neuro: Strength intact.  Assessment & Plan   Coronary artery calcification/chest pain Seen on chest CT in October 2024.  Nuclear stress test February 2025 was an abnormal likely low risk stress test showing moderate in size, moderate in severity nearly completely reversible defect involving the apical anterior, apical lateral, and apical segments most consistent with ischemia but cannot rule out artifact.  Patient reports episodes of chest pain.  It appears he has to different types of chest pain.  He experiences focal left-sided sharp chest pain when he is very tired.  He also describes left-sided chest pain that radiates up his neck and down his left arm that can occur with and without exertion and lasts up to 10 minutes.  EKG shows sinus bradycardia, 58 bpm. -Schedule LHC for next week. -Patient is instructed to skip dose of Ozempic on Sunday. -Rx as needed SL NTG. -Continue atorvastatin, aspirin. -ED precautions provided for patient and family.  Dyspnea Echo November 2024 showed normal LV/RV function, normal diastolic parameters, normal PA pressure, no significant valvular abnormalities.  Patient reports increasing dyspnea over the last year or so.  His wife feels he gives out a lot sooner secondary to dyspnea when doing activities in the yard.  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  He continues to smoke. -Scheduling LHC as above.  Tobacco abuse Patient patient smokes 1-1-1/2 packs of cigarettes per day.  He smokes less when he is at home as he does not smoke in his home or personal vehicles. -Smoking cessation encouraged.  Disposition: CBC and BMP today.  Schedule LHC for next week.  Return in 2 weeks.     Informed  Consent   Shared Decision Making/Informed Consent The risks [stroke (1 in 1000), death (1 in 1000), kidney failure [usually temporary] (1 in 500), bleeding (1 in 200), allergic reaction [possibly serious] (1 in 200)], benefits (diagnostic support and management of coronary artery disease) and alternatives of a cardiac catheterization were discussed in detail with Mr. Wrightsman and he is willing to proceed.      Signed, Etta Grandchild. [place]is Gentle, DNP, NP-C

## 2023-09-02 ENCOUNTER — Encounter: Payer: Self-pay | Admitting: Student

## 2023-09-02 ENCOUNTER — Ambulatory Visit: Payer: Self-pay | Admitting: Student

## 2023-09-02 ENCOUNTER — Ambulatory Visit: Payer: Self-pay | Attending: Student | Admitting: Student

## 2023-09-02 ENCOUNTER — Other Ambulatory Visit: Payer: Managed Care, Other (non HMO)

## 2023-09-02 VITALS — BP 122/70 | HR 64 | Ht 65.0 in | Wt 163.0 lb

## 2023-09-02 DIAGNOSIS — Z720 Tobacco use: Secondary | ICD-10-CM

## 2023-09-02 DIAGNOSIS — R072 Precordial pain: Secondary | ICD-10-CM

## 2023-09-02 DIAGNOSIS — R0609 Other forms of dyspnea: Secondary | ICD-10-CM

## 2023-09-02 DIAGNOSIS — R079 Chest pain, unspecified: Secondary | ICD-10-CM | POA: Diagnosis not present

## 2023-09-02 DIAGNOSIS — I2510 Atherosclerotic heart disease of native coronary artery without angina pectoris: Secondary | ICD-10-CM | POA: Diagnosis not present

## 2023-09-02 DIAGNOSIS — N401 Enlarged prostate with lower urinary tract symptoms: Secondary | ICD-10-CM

## 2023-09-02 MED ORDER — ASPIRIN 81 MG PO TBEC: 81.0000 mg | DELAYED_RELEASE_TABLET | Freq: Every day | ORAL | Status: AC

## 2023-09-02 MED ORDER — NITROGLYCERIN 0.4 MG SL SUBL: 0.4000 mg | SUBLINGUAL_TABLET | SUBLINGUAL | 3 refills | Status: DC | PRN

## 2023-09-02 NOTE — Patient Instructions (Addendum)
 Medication Instructions:  Your Physician recommend you continue on your current medication as directed.    *If you need a refill on your cardiac medications before your next appointment, please call your pharmacy*   Lab Work: Your provider would like for you to have following labs drawn today CBC and BMeT.   If you have labs (blood work) drawn today and your tests are completely normal, you will receive your results only by: MyChart Message (if you have MyChart) OR A paper copy in the mail If you have any lab test that is abnormal or we need to change your treatment, we will call you to review the results.   Testing/Procedures:  [place] [facility] [place] [address] [place] [address] Dept: [phone] Loc: [phone]  Brian Wolfe  09/02/2023  You are scheduled for a Cardiac Catheterization on Monday, March 17 with Dr. Lorine Bears.  1. Please arrive at the [facility] Entrance of [facility], [address], [place] [address] at 8:30 AM (This is 1 hour(s) prior to your procedure time).  Proceed to the Check-In Desk directly inside the entrance.  Procedure Parking: Use the entrance off of the [address] side of the hospital. Turn right upon entering and follow the driveway to parking that is directly in front of the Heart & Vascular Center. There is no valet parking available at this entrance, however there is an awning directly in front of the Heart & Vascular Center for drop off/ pick up for patients.  Special note: Every effort is made to have your procedure done on time. Please understand that emergencies sometimes delay scheduled procedures.  2. Diet: Do not eat solid foods after midnight.  The patient may have clear liquids until 5am upon the day of the procedure.  3. Labs: You will need to have blood drawn today.  4. Medication instructions in  preparation for your procedure:   Contrast Allergy: No  Hold you Ozempic on Sunday until after your cath on Monday.   Do not take Diabetes Med Glucophage (Metformin) on the day of the procedure and HOLD 48 HOURS AFTER THE PROCEDURE.  On the morning of your procedure, take your Aspirin 81 mg.  You may use sips of water.  5. Plan to go home the same day, you will only stay overnight if medically necessary. 6. Bring a current list of your medications and current insurance cards. 7. You MUST have a responsible person to drive you home. 8. Someone MUST be with you the first 24 hours after you arrive home or your discharge will be delayed. 9. Please wear clothes that are easy to get on and off and wear slip-on shoes.  Thank you for allowing [place] to care for you!   -- [place] [facility]   Follow-Up: At [facility], you and your health needs are our priority.  As part of our continuing mission to provide you with exceptional heart care, we have created designated Provider Care Teams.  These Care Teams include your primary Cardiologist (physician) and Advanced Practice Providers (APPs -  Physician Assistants and Nurse Practitioners) who all work together to provide you with the care you need, when you need it.    Your next appointment:   2 week(s)  Provider:   You may see [name], MD or Carlos Levering, NP

## 2023-09-03 LAB — CBC
Hematocrit: 44.1 % (ref 37.5–51.0)
Hemoglobin: 15.1 g/dL (ref 13.0–17.7)
MCH: 33 pg (ref 26.6–33.0)
MCHC: 34.2 g/dL (ref 31.5–35.7)
MCV: 97 fL (ref 79–97)
Platelets: 201 10*3/uL (ref 150–450)
RBC: 4.57 x10E6/uL (ref 4.14–5.80)
RDW: 12.4 % (ref 11.6–15.4)
WBC: 7.6 10*3/uL (ref 3.4–10.8)

## 2023-09-03 LAB — BASIC METABOLIC PANEL
BUN/Creatinine Ratio: 12 (ref 10–24)
BUN: 11 mg/dL (ref 8–27)
CO2: 24 mmol/L (ref 20–29)
Calcium: 10.1 mg/dL (ref 8.6–10.2)
Chloride: 102 mmol/L (ref 96–106)
Creatinine, Ser: 0.93 mg/dL (ref 0.76–1.27)
Glucose: 83 mg/dL (ref 70–99)
Potassium: 4.8 mmol/L (ref 3.5–5.2)
Sodium: 142 mmol/L (ref 134–144)
eGFR: 93 mL/min/{1.73_m2} (ref >59)

## 2023-09-03 LAB — PSA: Prostate Specific Ag, Serum: <0.1 ng/mL (ref 0.0–4.0)

## 2023-09-04 ENCOUNTER — Other Ambulatory Visit: Payer: Self-pay | Admitting: Family Medicine

## 2023-09-04 DIAGNOSIS — E1169 Type 2 diabetes mellitus with other specified complication: Secondary | ICD-10-CM

## 2023-09-05 ENCOUNTER — Encounter: Payer: Self-pay | Admitting: Cardiovascular Disease

## 2023-09-05 ENCOUNTER — Other Ambulatory Visit: Payer: Self-pay

## 2023-09-05 ENCOUNTER — Encounter: Payer: Self-pay | Admitting: Cardiology

## 2023-09-05 ENCOUNTER — Encounter
Admission: RE | Disposition: A | Discharge status: Home / Self Care | Payer: Self-pay | Source: Home / Self Care | Attending: Cardiovascular Disease

## 2023-09-05 ENCOUNTER — Ambulatory Visit
Admission: RE | Admit: 2023-09-05 | Discharge: 2023-09-05 | Disposition: A | Discharge status: Home / Self Care | Attending: Cardiovascular Disease | Admitting: Cardiovascular Disease

## 2023-09-05 ENCOUNTER — Encounter: Payer: Self-pay | Admitting: Urology

## 2023-09-05 DIAGNOSIS — E785 Hyperlipidemia, unspecified: Secondary | ICD-10-CM | POA: Diagnosis not present

## 2023-09-05 DIAGNOSIS — R072 Precordial pain: Secondary | ICD-10-CM

## 2023-09-05 DIAGNOSIS — E119 Type 2 diabetes mellitus without complications: Secondary | ICD-10-CM | POA: Insufficient documentation

## 2023-09-05 DIAGNOSIS — Z79899 Other long term (current) drug therapy: Secondary | ICD-10-CM | POA: Insufficient documentation

## 2023-09-05 DIAGNOSIS — Z7985 Long-term (current) use of injectable non-insulin antidiabetic drugs: Secondary | ICD-10-CM | POA: Diagnosis not present

## 2023-09-05 DIAGNOSIS — F17210 Nicotine dependence, cigarettes, uncomplicated: Secondary | ICD-10-CM | POA: Insufficient documentation

## 2023-09-05 DIAGNOSIS — I2510 Atherosclerotic heart disease of native coronary artery without angina pectoris: Secondary | ICD-10-CM

## 2023-09-05 DIAGNOSIS — Z8546 Personal history of malignant neoplasm of prostate: Secondary | ICD-10-CM | POA: Diagnosis not present

## 2023-09-05 DIAGNOSIS — R079 Chest pain, unspecified: Secondary | ICD-10-CM

## 2023-09-05 DIAGNOSIS — I2584 Coronary atherosclerosis due to calcified coronary lesion: Secondary | ICD-10-CM | POA: Diagnosis not present

## 2023-09-05 DIAGNOSIS — Z8249 Family history of ischemic heart disease and other diseases of the circulatory system: Secondary | ICD-10-CM | POA: Insufficient documentation

## 2023-09-05 DIAGNOSIS — R0609 Other forms of dyspnea: Secondary | ICD-10-CM

## 2023-09-05 DIAGNOSIS — Z7982 Long term (current) use of aspirin: Secondary | ICD-10-CM | POA: Insufficient documentation

## 2023-09-05 DIAGNOSIS — K760 Fatty (change of) liver, not elsewhere classified: Secondary | ICD-10-CM | POA: Insufficient documentation

## 2023-09-05 DIAGNOSIS — J439 Emphysema, unspecified: Secondary | ICD-10-CM | POA: Insufficient documentation

## 2023-09-05 DIAGNOSIS — R9439 Abnormal result of other cardiovascular function study: Secondary | ICD-10-CM

## 2023-09-05 DIAGNOSIS — K219 Gastro-esophageal reflux disease without esophagitis: Secondary | ICD-10-CM | POA: Insufficient documentation

## 2023-09-05 LAB — GLUCOSE, CAPILLARY: Glucose-Capillary: 125 mg/dL — ABNORMAL HIGH (ref 70–99)

## 2023-09-05 SURGERY — LEFT HEART CATH AND CORONARY ANGIOGRAPHY
Anesthesia: Moderate Sedation | Laterality: Left

## 2023-09-05 MED ORDER — SODIUM CHLORIDE 0.9 % WEIGHT BASED INFUSION
1.0000 mL/kg/h | INTRAVENOUS | Status: DC
Start: 2023-09-06 — End: 2023-09-05

## 2023-09-05 MED ORDER — MIDAZOLAM HCL 2 MG/2ML IJ SOLN
INTRAMUSCULAR | Status: DC | PRN
  Administered 2023-09-05: 1 mg via INTRAVENOUS

## 2023-09-05 MED ORDER — VERAPAMIL HCL 2.5 MG/ML IV SOLN
INTRAVENOUS | Status: DC | PRN
  Administered 2023-09-05: 2.5 mg via INTRA_ARTERIAL

## 2023-09-05 MED ORDER — HEPARIN SODIUM (PORCINE) 1000 UNIT/ML IJ SOLN
INTRAMUSCULAR | Status: DC | PRN
  Administered 2023-09-05: 3500 [IU] via INTRAVENOUS

## 2023-09-05 MED ORDER — SODIUM CHLORIDE 0.9 % IV SOLN: 250.0000 mL | INTRAVENOUS | Status: DC | PRN

## 2023-09-05 MED ORDER — VERAPAMIL HCL 2.5 MG/ML IV SOLN
INTRAVENOUS | Status: AC
  Filled 2023-09-05: qty 2

## 2023-09-05 MED ORDER — LIDOCAINE HCL 1 % IJ SOLN
INTRAMUSCULAR | Status: AC
  Filled 2023-09-05: qty 20

## 2023-09-05 MED ORDER — SODIUM CHLORIDE 0.9% FLUSH: 3.0000 mL | Freq: Two times a day (BID) | INTRAVENOUS | Status: DC

## 2023-09-05 MED ORDER — HEPARIN (PORCINE) IN NACL 1000-0.9 UT/500ML-% IV SOLN
INTRAVENOUS | Status: DC | PRN
  Administered 2023-09-05: 1000 mL

## 2023-09-05 MED ORDER — FENTANYL CITRATE (PF) 100 MCG/2ML IJ SOLN
INTRAMUSCULAR | Status: DC | PRN
  Administered 2023-09-05: 25 ug via INTRAVENOUS

## 2023-09-05 MED ORDER — ACETAMINOPHEN 325 MG PO TABS: 650.0000 mg | ORAL_TABLET | ORAL | Status: DC | PRN

## 2023-09-05 MED ORDER — IOHEXOL 300 MG/ML  SOLN
INTRAMUSCULAR | Status: DC | PRN
  Administered 2023-09-05: 40 mL

## 2023-09-05 MED ORDER — HEPARIN SODIUM (PORCINE) 1000 UNIT/ML IJ SOLN
INTRAMUSCULAR | Status: AC
  Filled 2023-09-05: qty 10

## 2023-09-05 MED ORDER — MIDAZOLAM HCL 2 MG/2ML IJ SOLN
INTRAMUSCULAR | Status: AC
Start: 2023-09-05 — End: ?
  Filled 2023-09-05: qty 2

## 2023-09-05 MED ORDER — SODIUM CHLORIDE 0.9% FLUSH: 3.0000 mL | INTRAVENOUS | Status: DC | PRN

## 2023-09-05 MED ORDER — ONDANSETRON HCL 4 MG/2ML IJ SOLN: 4.0000 mg | Freq: Four times a day (QID) | INTRAMUSCULAR | Status: DC | PRN

## 2023-09-05 MED ORDER — SODIUM CHLORIDE 0.9 % WEIGHT BASED INFUSION
3.0000 mL/kg/h | INTRAVENOUS | Status: DC
  Administered 2023-09-05: 3 mL/kg/h via INTRAVENOUS

## 2023-09-05 MED ORDER — LIDOCAINE HCL (PF) 1 % IJ SOLN
INTRAMUSCULAR | Status: DC | PRN
  Administered 2023-09-05: 2 mL

## 2023-09-05 MED ORDER — ASPIRIN 81 MG PO CHEW
81.0000 mg | CHEWABLE_TABLET | ORAL | Status: DC
Start: 2023-09-06 — End: 2023-09-05

## 2023-09-05 MED ORDER — FENTANYL CITRATE (PF) 100 MCG/2ML IJ SOLN
INTRAMUSCULAR | Status: AC
  Filled 2023-09-05: qty 2

## 2023-09-05 MED ORDER — HEPARIN (PORCINE) IN NACL 1000-0.9 UT/500ML-% IV SOLN
INTRAVENOUS | Status: AC
  Filled 2023-09-05: qty 1000

## 2023-09-05 SURGICAL SUPPLY — 10 items
CATH INFINITI AMBI 5FR JK (CATHETERS) IMPLANT
DEVICE RAD TR BAND REGULAR (VASCULAR PRODUCTS) IMPLANT
DRAPE BRACHIAL (DRAPES) IMPLANT
GLIDESHEATH SLEND SS 6F .021 (SHEATH) IMPLANT
GUIDEWIRE INQWIRE 1.5J.035X260 (WIRE) IMPLANT
INQWIRE 1.5J .035X260CM (WIRE) ×1 IMPLANT
PACK CARDIAC CATH (CUSTOM PROCEDURE TRAY) ×1 IMPLANT
PROTECTION STATION PRESSURIZED (MISCELLANEOUS) ×1 IMPLANT
SET ATX-X65L (MISCELLANEOUS) IMPLANT
STATION PROTECTION PRESSURIZED (MISCELLANEOUS) IMPLANT

## 2023-09-05 NOTE — Discharge Instructions (Signed)
 Radial Site Care Refer to this sheet in the next few weeks. These instructions provide you with information about caring for yourself after your procedure. Your health care provider may also give you more specific instructions. Your treatment has been planned according to current medical practices, but problems sometimes occur. Call your health care provider if you have any problems or questions after your procedure. What can I expect after the procedure? After your procedure, it is typical to have the following: Bruising at the radial site that usually fades within 1-2 weeks. Blood collecting in the tissue (hematoma) that may be painful to the touch. It should usually decrease in size and tenderness within 1-2 weeks.  Follow these instructions at home: Take medicines only as directed by your health care provider. If you are on a medication called Metformin please do not take for 48 hours after your procedure. Over the next 48hrs please increase your fluid intake of water and non caffeine beverages to flush the contrast dye out of your system.  You may shower 24 hours after the procedure  Leave your bandage on and gently wash the site with plain soap and water. Pat the area dry with a clean towel. Do not rub the site, because this may cause bleeding.  Remove your dressing 48hrs after your procedure and leave open to air.  Do not submerge your site in water for 7 days. This includes swimming and washing dishes.  Check your insertion site every day for redness, swelling, or drainage. Do not apply powder or lotion to the site. Do not flex or bend the affected arm for 24 hours or as directed by your health care provider. Do not push or pull heavy objects with the affected arm for 24 hours or as directed by your health care provider. Do not lift over 10 lb (4.5 kg) for 5 days after your procedure or as directed by your health care provider. Ask your health care provider when it is okay to: Return to  work or school. Resume usual physical activities or sports. Resume sexual activity. Do not drive home if you are discharged the same day as the procedure. Have someone else drive you. You may drive 48 hours after the procedure Do not operate machinery or power tools for 24 hours after the procedure. If your procedure was done as an outpatient procedure, which means that you went home the same day as your procedure, a responsible adult should be with you for the first 24 hours after you arrive home. Keep all follow-up visits as directed by your health care provider. This is important. Contact a health care provider if: You have a fever. You have chills. You have increased bleeding from the radial site. Hold pressure on the site. Get help right away if: You have unusual pain at the radial site. You have redness, warmth, or swelling at the radial site. You have drainage (other than a small amount of blood on the dressing) from the radial site. The radial site is bleeding, and the bleeding does not stop after 15 minutes of holding steady pressure on the site. Your arm or hand becomes pale, cool, tingly, or numb. This information is not intended to replace advice given to you by your health care provider. Make sure you discuss any questions you have with your health care provider. Document Released: 07/10/2010 Document Revised: 11/13/2015 Document Reviewed: 12/24/2013 Elsevier Interactive Patient Education  2018 [employer].

## 2023-09-05 NOTE — Interval H&P Note (Signed)
 History and Physical Interval Note:  09/05/2023 9:50 AM  Brian Wolfe  has presented today for surgery, with the diagnosis of L Cath   Chest pain.  The various methods of treatment have been discussed with the patient and family. After consideration of risks, benefits and other options for treatment, the patient has consented to  Procedure(s): LEFT HEART CATH AND CORONARY ANGIOGRAPHY (Left) as a surgical intervention.  The patient's history has been reviewed, patient examined, no change in status, stable for surgery.  I have reviewed the patient's chart and labs.  Questions were answered to the patient's satisfaction.     Lorine Bears

## 2023-09-05 NOTE — Telephone Encounter (Signed)
 Is it ok to go ahead and schedule patient for the first available or he needs to wait a certain period of time after this procedure?

## 2023-09-06 ENCOUNTER — Telehealth: Payer: Self-pay

## 2023-09-06 ENCOUNTER — Other Ambulatory Visit: Payer: Self-pay | Admitting: Emergency Medicine

## 2023-09-06 NOTE — Telephone Encounter (Signed)
 Brian Wolfe (Key: [id number]) Rx #: [phone] Ozempic (0.25 or 0.5 MG/DOSE) 2MG [name] pen-injectors Form Passenger transport manager PA Form [phone] NCPDP)

## 2023-09-06 NOTE — Telephone Encounter (Signed)
 [place] 08/02/23 within protocol.  Requested Prescriptions  Pending Prescriptions Disp Refills   OZEMPIC, 0.25 OR 0.5 MG/DOSE, 2 MG/3ML SOPN [Pharmacy Med Name: OZEMPIC 0.25-0.5 MG/DOSE PEN] 6 mL 0    Sig: INJECT 0.25 MG AS DIRECTED ONCE A WEEK.     Endocrinology:  Diabetes - GLP-1 Receptor Agonists - semaglutide Failed - 09/06/2023  9:34 AM      Failed - HBA1C in normal range and within 180 days    Hemoglobin A1C  Date Value Ref Range Status  08/02/2023 6.3 (A) 4.0 - 5.6 % Final   Hgb A1c MFr Bld  Date Value Ref Range Status  02/07/2023 6.8 (H) <5.7 % of total Hgb Final    Comment:    For someone without known diabetes, a hemoglobin A1c value of 6.5% or greater indicates that they may have  diabetes and this should be confirmed with a follow-up  test. . For someone with known diabetes, a value <7% indicates  that their diabetes is well controlled and a value  greater than or equal to 7% indicates suboptimal  control. A1c targets should be individualized based on  duration of diabetes, age, comorbid conditions, and  other considerations. . Currently, no consensus exists regarding use of hemoglobin A1c for diagnosis of diabetes for children. .          Failed - Valid encounter within last 6 months    Recent Outpatient Visits           6 months ago Annual physical exam   [facility] [facility] [place], Netta Neat, DO   1 year ago Type 2 diabetes mellitus with other specified complication, without long-term current use of insulin [facility])   [place] [facility] [place], Netta Neat, DO   1 year ago Simple chronic bronchitis [facility])   [facility] [name], DO   1 year ago Type 2 diabetes mellitus with other specified complication, without long-term current use of insulin [facility])   [place] [facility] [place], Netta Neat, DO   1 year ago Type 2 diabetes mellitus with  other specified complication, without long-term current use of insulin [facility])   [place] [facility] Althea Charon, Netta Neat, DO       Future Appointments             In 1 week [name], MD [facility] at [place]   In 5 months Althea Charon, Netta Neat, DO [facility]   In 6 months Vanna Scotland, MD [facility] [place]            Passed - Cr in normal range and within 360 days    Creat  Date Value Ref Range Status  02/07/2023 0.85 0.70 - 1.35 mg/dL Final   Creatinine, Ser  Date Value Ref Range Status  09/02/2023 0.93 0.76 - 1.27 mg/dL Final   Creatinine, Urine  Date Value Ref Range Status  08/18/2022 248 20 - 320 mg/dL Final

## 2023-09-08 ENCOUNTER — Ambulatory Visit: Admitting: Urology

## 2023-09-11 ENCOUNTER — Other Ambulatory Visit: Payer: Self-pay | Admitting: Family Medicine

## 2023-09-11 DIAGNOSIS — F419 Anxiety disorder, unspecified: Secondary | ICD-10-CM

## 2023-09-11 DIAGNOSIS — E1169 Type 2 diabetes mellitus with other specified complication: Secondary | ICD-10-CM

## 2023-09-12 NOTE — Telephone Encounter (Signed)
 [id number];OZHYQM:[id number];Review Type:Prior Auth;Coverage Start Date:09/06/2023;Coverage End Date:09/10/2024;. Authorization Expiration Date: September 10, 2024.

## 2023-09-13 ENCOUNTER — Encounter: Payer: Self-pay | Admitting: Family Medicine

## 2023-09-13 NOTE — Telephone Encounter (Signed)
 Requested Prescriptions  Pending Prescriptions Disp Refills   escitalopram (LEXAPRO) 20 MG tablet [Pharmacy Med Name: ESCITALOPRAM 20 MG TABLET] 90 tablet 0    Sig: TAKE 1 TABLET BY MOUTH EVERY DAY     Psychiatry:  Antidepressants - SSRI Failed - 09/13/2023  8:15 AM      Failed - Valid encounter within last 6 months    Recent Outpatient Visits           7 months ago Annual physical exam   [place] [facility] [place], Netta Neat, DO   1 year ago Type 2 diabetes mellitus with other specified complication, without long-term current use of insulin [facility])   [place] [facility] [name], DO   1 year ago Simple chronic bronchitis [facility])   [place] [facility] [place], Netta Neat, DO   1 year ago Type 2 diabetes mellitus with other specified complication, without long-term current use of insulin [facility])   [place] [facility] [place], Netta Neat, DO   1 year ago Type 2 diabetes mellitus with other specified complication, without long-term current use of insulin [facility])   [place] [facility] [place], Netta Neat, DO       Future Appointments             In 3 days Agbor-Etang, Arlys John, MD [facility] at [place]   In 1 month [place], [place] [facility] [place]   In 4 months Althea Charon, Netta Neat, DO [place] [facility], [facility]   In 6 months Vanna Scotland, MD [facility] [place]            Passed - Completed PHQ-2 or PHQ-9 in the last 360 days       atorvastatin (LIPITOR) 20 MG tablet [Pharmacy Med Name: ATORVASTATIN 20 MG TABLET] 90 tablet 1    Sig: TAKE 1 TABLET BY MOUTH EVERY DAY     Cardiovascular:  Antilipid - Statins Failed - 09/13/2023  8:15 AM      Failed - Lipid Panel in normal range within the last 12 months    Cholesterol, Total  Date Value Ref Range Status  07/29/2015 165 100 - 199  mg/dL Final   Cholesterol  Date Value Ref Range Status  02/07/2023 121 <200 mg/dL Final   LDL Cholesterol (Calc)  Date Value Ref Range Status  02/07/2023 57 mg/dL (calc) Final    Comment:    Reference range: <100 . Desirable range <100 mg/dL for primary prevention;   <70 mg/dL for patients with CHD or diabetic patients  with > or = 2 CHD risk factors. Marland Kitchen LDL-C is now calculated using the Martin-Hopkins  calculation, which is a validated novel method providing  better accuracy than the Friedewald equation in the  estimation of LDL-C.  Horald Pollen et al. Lenox Ahr. 4098;119(14): 2061-2068  (http://education.QuestDiagnostics.com/faq/FAQ164)    HDL  Date Value Ref Range Status  02/07/2023 47 > OR = 40 mg/dL Final  78/29/5621 37 (L) >39 mg/dL Final   Triglycerides  Date Value Ref Range Status  02/07/2023 83 <150 mg/dL Final         Passed - Patient is not pregnant      Passed - Valid encounter within last 12 months    Recent Outpatient Visits           7 months ago Annual physical exam   [place] [facility]  [name], DO   1 year ago Type 2 diabetes mellitus with other specified complication, without long-term current use of insulin [facility])   [place] [facility] [place], Netta Neat, DO   1 year ago Simple chronic bronchitis [facility])   [place] [facility] [name], DO   1 year ago Type 2 diabetes mellitus with other specified complication, without long-term current use of insulin [facility])   [place] [facility] [place], Netta Neat, DO   1 year ago Type 2 diabetes mellitus with other specified complication, without long-term current use of insulin [facility])   [place] [facility] Althea Charon, Netta Neat, DO       Future Appointments             In 3 days Agbor-Etang, Arlys John, MD [facility] HeartCare at [place]   In 1 month [place], Elana [facility] [place]   In 4 months Althea Charon, Netta Neat, DO  [facility] [place]   In 6 months Vanna Scotland, MD [facility]

## 2023-09-15 ENCOUNTER — Telehealth: Payer: Self-pay | Admitting: Cardiology

## 2023-09-15 NOTE — Telephone Encounter (Signed)
 Pts wife returning call in regards to form they need for his appt tomorrow. Please advise

## 2023-09-15 NOTE — Telephone Encounter (Addendum)
 Spoke with patients wife and she reports they need short term disability paperwork to be completed for the time that he was out of work. She states that she called in and they require a fee and then we can get it processed. Confirmed that information. She states the company will be faxing the forms over and they have a visit tomorrow. Advised that we will be happy to assist but that it can take up to a week before we can get them all completed. She confirmed appointment for tomorrow and verbalized understanding with no further questions.

## 2023-09-16 ENCOUNTER — Encounter: Payer: Self-pay | Admitting: Cardiology

## 2023-09-16 ENCOUNTER — Ambulatory Visit: Attending: Cardiology | Admitting: Cardiology

## 2023-09-16 VITALS — BP 144/76 | HR 72 | Ht 65.0 in | Wt 164.0 lb

## 2023-09-16 DIAGNOSIS — E7800 Pure hypercholesterolemia, unspecified: Secondary | ICD-10-CM | POA: Diagnosis not present

## 2023-09-16 DIAGNOSIS — I2510 Atherosclerotic heart disease of native coronary artery without angina pectoris: Secondary | ICD-10-CM | POA: Diagnosis not present

## 2023-09-16 DIAGNOSIS — R0609 Other forms of dyspnea: Secondary | ICD-10-CM

## 2023-09-16 DIAGNOSIS — F17200 Nicotine dependence, unspecified, uncomplicated: Secondary | ICD-10-CM | POA: Diagnosis not present
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
## 2023-09-16 NOTE — Patient Instructions (Signed)
 Medication Instructions:  No changes at this time.   *If you need a refill on your cardiac medications before your next appointment, please call your pharmacy*  Lab Work: None  If you have labs (blood work) drawn today and your tests are completely normal, you will receive your results only by: MyChart Message (if you have MyChart) OR A paper copy in the mail If you have any lab test that is abnormal or we need to change your treatment, we will call you to review the results.  Testing/Procedures: None  Follow-Up: At [facility], you and your health needs are our priority.  As part of our continuing mission to provide you with exceptional heart care, our providers are all part of one team.  This team includes your primary Cardiologist (physician) and Advanced Practice Providers or APPs (Physician Assistants and Nurse Practitioners) who all work together to provide you with the care you need, when you need it.  Your next appointment:   1 year(s)  Provider:   You may see [name], MD or one of the following Advanced Practice Providers on your designated Care Team:   Nicolasa Ducking, NP Ames Dura, PA-C Eula Listen, PA-C [facility], PA-C Charlsie Quest, NP Carlos Levering, NP

## 2023-09-19 ENCOUNTER — Other Ambulatory Visit: Payer: Self-pay

## 2023-09-20 ENCOUNTER — Telehealth: Payer: Self-pay | Admitting: Cardiology

## 2023-09-20 DIAGNOSIS — Z0279 Encounter for issue of other medical certificate: Secondary | ICD-10-CM

## 2023-09-20 NOTE — Telephone Encounter (Signed)
 Patient paid $29 cash filled out paperwork placed in nurse box

## 2023-09-23 NOTE — Telephone Encounter (Signed)
 Late Entry:  4/1  Paperwork received and will be reviewed with provider.

## 2023-09-26 ENCOUNTER — Ambulatory Visit: Admitting: Urology

## 2023-09-26 NOTE — Telephone Encounter (Signed)
 Paperwork completed and given to front office staff

## 2023-09-26 NOTE — Telephone Encounter (Signed)
 Forms has been faxed as requested & patient called to pick up at front desk

## 2023-10-20 ENCOUNTER — Ambulatory Visit: Admitting: Urology

## 2023-10-20 ENCOUNTER — Ambulatory Visit: Admitting: Internal Medicine

## 2023-10-20 ENCOUNTER — Encounter: Payer: Self-pay | Admitting: Urology

## 2023-10-20 ENCOUNTER — Encounter: Payer: Self-pay | Admitting: Internal Medicine

## 2023-10-20 VITALS — BP 110/70 | HR 77 | Temp 98.8°F | Ht 65.0 in | Wt 161.8 lb

## 2023-10-20 VITALS — BP 132/74 | HR 75

## 2023-10-20 DIAGNOSIS — C61 Malignant neoplasm of prostate: Secondary | ICD-10-CM

## 2023-10-20 DIAGNOSIS — J449 Chronic obstructive pulmonary disease, unspecified: Secondary | ICD-10-CM | POA: Diagnosis not present

## 2023-10-20 DIAGNOSIS — N5231 Erectile dysfunction following radical prostatectomy: Secondary | ICD-10-CM

## 2023-10-20 DIAGNOSIS — F17210 Nicotine dependence, cigarettes, uncomplicated: Secondary | ICD-10-CM | POA: Diagnosis not present

## 2023-10-20 MED ORDER — TRELEGY ELLIPTA 100-62.5-25 MCG/ACT IN AEPB: 2.0000 | INHALATION_SPRAY | Freq: Two times a day (BID) | RESPIRATORY_TRACT | Status: DC

## 2023-10-20 MED ORDER — TRELEGY ELLIPTA 200-62.5-25 MCG/ACT IN AEPB: 1.0000 | INHALATION_SPRAY | Freq: Every day | RESPIRATORY_TRACT | 5 refills | Status: DC

## 2023-10-20 NOTE — Progress Notes (Signed)
 10/20/2023 5:17 PM  Brian Wolfe [birth year] [id number]   Referring provider: Raina Bunting, DO [address] [place],  [place] [address]  Urological history: 1.  Prostate cancer - PSA (08/2023) <0.1 - Prostatectomy (12/2021)   2.  Erectile dysfunction - Failed PDE 5 inhibitors  Chief Complaint  Patient presents with   Follow-up    - trimix    HPI: Brian Wolfe is a 63 y.o. male who presents today for Trimix titration.    Previous records reviewed.     The procedure is discussed with patient.  He is allowed to ask questions.  Questions were answered to his satisfaction.  We were able to proceed to the titration.  Physical Exam:  BP 132/74   Pulse 75   Constitutional:  Well nourished. Alert and oriented, No acute distress. GU: Patient with uncircumcised phallus.   Foreskin easily retracted.  Urethral meatus is patent.  No penile discharge. No penile lesions or rashes.  Psychiatric: Normal mood and affect.  GU exam upon return: Patient with a flaccid phallus but still tender to palpation and bruised.   Procedure  Patient's left corpus cavernosum is identified.  An area near the base of the penis is cleansed with rubbing alcohol.  Careful to avoid the dorsal vein, 2 mcg of Trimix (papaverine 30 mg, phentolamine 1 mg and prostaglandin E1 10 mcg, Lot # [id number]$  exp # 11/20/2023 is injected at a 90 degree angle into the left corpus cavernosum near the base of the penis.  Patient experienced penile fullness.  Patient's right corpus cavernosum is identified.  An area near the base of the penis is cleansed with rubbing alcohol.  Careful to avoid the dorsal vein,  2 mcg of Trimix (papaverine 30 mg, phentolamine 1 mg and prostaglandin E1 10 mcg, Lot # [id number]$  exp # 11/20/2023 is injected at a 90 degree angle into the right corpus cavernosum near the base of the penis.  Patient experienced penile fullness.    Patient's left corpus cavernosum is identified.  An area  near the base of the penis is cleansed with rubbing alcohol.  Careful to avoid the dorsal vein, 2 mcg of Trimix (papaverine 30 mg, phentolamine 1 mg and prostaglandin E1 10 mcg, Lot # [id number]$  exp # 11/20/2023 is injected at a 90 degree angle into the left corpus cavernosum near the base of the penis.  Patient experienced a semi firm erection in 15 minutes.   Assessment & Plan:    1.  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 Prostate cancer - Recent PSA undetectable - Scheduled for follow-up with Dr. Ace Holder in September   Return for keep follow up with Dr. Ace Holder .  Matilde Son, PA-C   [facility] [phone]  [address] [place], [place] [address] [phone]  I spent 30 minutes on the day of the encounter to include pre-visit record review, face-to-face time with the patient, and post-visit ordering of tests.

## 2023-10-20 NOTE — Patient Instructions (Addendum)
 Start TRELEGY one puff once a day  Rinse mouth after every use  Use Albuterol  2 puffs every 4 hrs as needed  Please stop smoking  Check oxygen levels at night  Check lung function tests  Follow up Lung cancer screening  Avoid Allergens and Irritants Avoid secondhand smoke Avoid SICK contacts Recommend  Masking  when appropriate Recommend Keep up-to-date with vaccinations

## 2023-10-20 NOTE — Patient Instructions (Signed)
 TRIMIX SELF-INJECTION INSTRUCTIONS    DETAILED PROCEDURE  1. GETTING SET UP  A. Proper hygiene is important. Wash your hands and keep the penis clean.  B. Assemble the following:  - Bottle of Trimix  - Alcohol pad  - Syringe  C. Keep the Trimix cold by returning the bottle to the refrigerator, or by placing the bottle in a cup of ice.   2. PREPARE THE SYRINGE  A. Wipe the rubber top of the vial with an alcohol pad.  B. After removing the cap of the needle, pull the plunger back to the desired dosage, filling this volume with air. Use a new needle and syringe each time.  C. Insert the needle through the rubber top and inject the air into the vial.  D. Turn the vial with needle and syringe inserted upside down. Pull back on the syringe plunger in a slow and steady motion until the desired dosage is achieved.  E. Tap the side of the syringe (1cc tuberculin syringe with a 29 gauge needle) to allow any air bubbles to float towards the needle. Avoid having these air bubbles in the syringe when self-injecting by first injecting out the collected bubbles that may form.  F. Remove the needle from the bottle and replace the protective cap on the needle.    3. SELECT AND PREPARE THE SITE FOR INJECTION  A. The proper location for injection is at the 9-11 and 1-3 o'clock positions, between the base and mid-portion of the penis.(see diagram) Avoid the midline because of potential for injury to the urethra (6 o'clock; for urinary passage) and the penile arteries and nerves (near 12 o'clock). Avoid any visible veins or arteries on the surface.  B. Grasp and pull the head of the penis toward the side of your leg with the index finger and thumb (use the left hand, if right handed). While maintaining light tension, select a site for injection.  C. Clean the site with an alcohol pad.   4: INJECT TRIMIX AND APPLY COMPRESSION  A. With a steady and continuous motion, penetrate the skin with the needle at a 90 o  angle. The needle should then be advanced to the hub. Slight resistance is encountered as the needle passes into the proper position within the erectile tissue (corporeal body).  B. Inject the Trimix over approximately 4 seconds. Withdraw the needle from the penis and apply compression to the injection site for approximately 1 minute. Several minutes of compression may be required to avoid bleeding, especially if you are an aspirin  user.  C. Replace the cap on the needle and dispose of properly.   If you experience a painful erection that will not go down, take four (30 mg) tablets of pseudoephedrine (Sudafed-not the extended release) and if the erection does not go down in the next hour or increases in pain, contact the office immediately or seek treatment in the ED     Your dose it 0.6 cc
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
## 2023-10-21 ENCOUNTER — Ambulatory Visit: Admitting: Urology

## 2023-11-05 ENCOUNTER — Other Ambulatory Visit: Payer: Self-pay | Admitting: Family Medicine

## 2023-11-05 DIAGNOSIS — E1169 Type 2 diabetes mellitus with other specified complication: Secondary | ICD-10-CM

## 2023-11-08 ENCOUNTER — Other Ambulatory Visit: Payer: Self-pay | Admitting: Urology

## 2023-11-08 MED ORDER — TADALAFIL 5 MG PO TABS: 5.0000 mg | ORAL_TABLET | Freq: Every day | ORAL | 0 refills | Status: DC | PRN

## 2023-11-08 NOTE — Telephone Encounter (Signed)
 Requested Prescriptions  Pending Prescriptions Disp Refills   OZEMPIC , 0.25 OR 0.5 MG/DOSE, 2 MG/3ML SOPN [Pharmacy Med Name: OZEMPIC  0.25-0.5 MG/DOSE PEN] 6 mL 0    Sig: INJECT 0.25 MG AS DIRECTED ONCE A WEEK.     Endocrinology:  Diabetes - GLP-1 Receptor Agonists - semaglutide  Failed - 11/08/2023 11:07 AM      Failed - HBA1C in normal range and within 180 days    Hemoglobin A1C  Date Value Ref Range Status  08/02/2023 6.3 (A) 4.0 - 5.6 % Final   Hgb A1c MFr Bld  Date Value Ref Range Status  02/07/2023 6.8 (H) <5.7 % of total Hgb Final    Comment:    For someone without known diabetes, a hemoglobin A1c value of 6.5% or greater indicates that they may have  diabetes and this should be confirmed with a follow-up  test. . For someone with known diabetes, a value <7% indicates  that their diabetes is well controlled and a value  greater than or equal to 7% indicates suboptimal  control. A1c targets should be individualized based on  duration of diabetes, age, comorbid conditions, and  other considerations. . Currently, no consensus exists regarding use of hemoglobin A1c for diagnosis of diabetes for children. .          Passed - Cr in normal range and within 360 days    Creat  Date Value Ref Range Status  02/07/2023 0.85 0.70 - 1.35 mg/dL Final   Creatinine, Ser  Date Value Ref Range Status  09/02/2023 0.93 0.76 - 1.27 mg/dL Final   Creatinine, Urine  Date Value Ref Range Status  08/18/2022 248 20 - 320 mg/dL Final         Passed - Valid encounter within last 6 months    Recent Outpatient Visits           3 months ago Type 2 diabetes mellitus with other specified complication, without long-term current use of insulin  [facility])   [facility] [place], Kayleen Party, DO       Future Appointments             In 3 months [place], Kayleen Party, DO [facility]   In 4 months Dustin Gimenez, MD [facility]  Urology [facility]

## 2023-11-09 ENCOUNTER — Telehealth: Payer: Self-pay

## 2023-11-09 ENCOUNTER — Encounter: Payer: Self-pay | Admitting: Internal Medicine

## 2023-11-09 DIAGNOSIS — G4734 Idiopathic sleep related nonobstructive alveolar hypoventilation: Secondary | ICD-10-CM

## 2023-11-09 NOTE — Telephone Encounter (Signed)
 ONO reviewed by Dr. Auston Left- Low SpO2 86%. 1L oxygen at night.  I have notified the patient's wife (Rachel/DPR). She will talk to the patient and let us  know if he would like to go ahead with the O2 order. They will call back and let us  know.  Nothing further needed.

## 2023-11-10 NOTE — Telephone Encounter (Signed)
 Copied from CRM [id number]. Topic: General - Other >> Nov 09, 2023  4:34 PM Dyann Glaser G wrote: Reason for CRM: PT SPOUSE STATED THEY WOULD LIKE THE O2 ORDER PLACED. THANKS

## 2023-11-10 NOTE — Telephone Encounter (Signed)
 FYI- Patient does not want to use O2 at night.

## 2023-11-26 ENCOUNTER — Encounter: Payer: Self-pay | Admitting: Family Medicine

## 2023-11-28 ENCOUNTER — Other Ambulatory Visit: Payer: Self-pay

## 2023-11-28 DIAGNOSIS — K219 Gastro-esophageal reflux disease without esophagitis: Secondary | ICD-10-CM

## 2023-11-28 DIAGNOSIS — F419 Anxiety disorder, unspecified: Secondary | ICD-10-CM

## 2023-11-28 MED ORDER — ESCITALOPRAM OXALATE 20 MG PO TABS: 20.0000 mg | ORAL_TABLET | Freq: Every day | ORAL | 0 refills | Status: DC

## 2023-11-28 MED ORDER — OMEPRAZOLE 20 MG PO CPDR: DELAYED_RELEASE_CAPSULE | ORAL | 1 refills | Status: DC

## 2023-11-29 ENCOUNTER — Telehealth: Payer: Self-pay

## 2023-11-29 ENCOUNTER — Encounter: Payer: Self-pay | Admitting: Family Medicine

## 2023-11-29 ENCOUNTER — Encounter: Payer: Self-pay | Admitting: Internal Medicine

## 2023-11-29 NOTE — Telephone Encounter (Signed)
 Copied from CRM [id number]. Topic: Clinical - Medical Advice >> Nov 29, 2023 12:59 PM Antwanette L wrote: Reason for CRM: The patient wife Kayleen Party) is calling because the  patient had  his A1C drawn in February of 2025. Kayleen Party wants to know when can the patient have his A1C draw again so the insurance provider can cover it? Kayleen Party is requesting a callback at  [phone]

## 2023-12-02 ENCOUNTER — Encounter

## 2024-01-05 ENCOUNTER — Encounter: Payer: Self-pay | Admitting: Urology

## 2024-01-05 ENCOUNTER — Other Ambulatory Visit: Payer: Self-pay | Admitting: Student

## 2024-01-10 ENCOUNTER — Other Ambulatory Visit

## 2024-01-10 ENCOUNTER — Ambulatory Visit: Admitting: Family Medicine

## 2024-01-11 ENCOUNTER — Other Ambulatory Visit

## 2024-01-11 ENCOUNTER — Ambulatory Visit (HOSPITAL_COMMUNITY)
Admission: RE | Admit: 2024-01-11 | Discharge: 2024-01-11 | Disposition: A | Discharge status: Home / Self Care | Source: Ambulatory Visit | Attending: Internal Medicine | Admitting: Internal Medicine

## 2024-01-11 DIAGNOSIS — E559 Vitamin D deficiency, unspecified: Secondary | ICD-10-CM

## 2024-01-11 DIAGNOSIS — I2510 Atherosclerotic heart disease of native coronary artery without angina pectoris: Secondary | ICD-10-CM

## 2024-01-11 DIAGNOSIS — E538 Deficiency of other specified B group vitamins: Secondary | ICD-10-CM

## 2024-01-11 DIAGNOSIS — Z0000 Encounter for general adult medical examination without abnormal findings: Secondary | ICD-10-CM

## 2024-01-11 DIAGNOSIS — J449 Chronic obstructive pulmonary disease, unspecified: Secondary | ICD-10-CM | POA: Diagnosis present

## 2024-01-11 DIAGNOSIS — E1169 Type 2 diabetes mellitus with other specified complication: Secondary | ICD-10-CM

## 2024-01-11 LAB — COMPLETE METABOLIC PANEL WITHOUT GFR
AG Ratio: 1.6 (calc) (ref 1.0–2.5)
ALT: 18 U/L (ref 9–46)
AST: 17 U/L (ref 10–35)
Albumin: 4.2 g/dL (ref 3.6–5.1)
Alkaline phosphatase (APISO): 59 U/L (ref 35–144)
BUN: 13 mg/dL (ref 7–25)
CO2: 26 mmol/L (ref 20–32)
Calcium: 9.7 mg/dL (ref 8.6–10.3)
Chloride: 105 mmol/L (ref 98–110)
Creat: 0.78 mg/dL (ref 0.70–1.35)
Globulin: 2.6 g/dL (ref 1.9–3.7)
Glucose, Bld: 139 mg/dL — ABNORMAL HIGH (ref 65–99)
Potassium: 4.3 mmol/L (ref 3.5–5.3)
Sodium: 140 mmol/L (ref 135–146)
Total Bilirubin: 0.3 mg/dL (ref 0.2–1.2)
Total Protein: 6.8 g/dL (ref 6.1–8.1)

## 2024-01-11 LAB — CBC WITH DIFFERENTIAL/PLATELET
Absolute Lymphocytes: 1818 {cells}/uL (ref 850–3900)
Absolute Monocytes: 365 {cells}/uL (ref 200–950)
Basophils Absolute: 58 {cells}/uL (ref 0–200)
Basophils Relative: 0.9 %
Eosinophils Absolute: 288 {cells}/uL (ref 15–500)
Eosinophils Relative: 4.5 %
HCT: 40.9 % (ref 38.5–50.0)
Hemoglobin: 13.6 g/dL (ref 13.2–17.1)
MCH: 32.5 pg (ref 27.0–33.0)
MCHC: 33.3 g/dL (ref 32.0–36.0)
MCV: 97.6 fL (ref 80.0–100.0)
MPV: 11.7 fL (ref 7.5–12.5)
Monocytes Relative: 5.7 %
Neutro Abs: 3872 {cells}/uL (ref 1500–7800)
Neutrophils Relative %: 60.5 %
Platelets: 156 Thousand/uL (ref 140–400)
RBC: 4.19 Million/uL — ABNORMAL LOW (ref 4.20–5.80)
RDW: 12.7 % (ref 11.0–15.0)
Total Lymphocyte: 28.4 %
WBC: 6.4 Thousand/uL (ref 3.8–10.8)

## 2024-01-11 LAB — PULMONARY FUNCTION TEST
DL/VA % pred: 67 %
DL/VA: 2.89 ml/min/mmHg/L
DLCO unc % pred: 60 %
DLCO unc: 14.21 ml/min/mmHg
FEF 25-75 Post: 2.66 L/s
FEF 25-75 Pre: 2.35 L/s
FEF2575-%Change-Post: 13 %
FEF2575-%Pred-Post: 107 %
FEF2575-%Pred-Pre: 95 %
FEV1-%Change-Post: 2 %
FEV1-%Pred-Post: 89 %
FEV1-%Pred-Pre: 86 %
FEV1-Post: 2.65 L
FEV1-Pre: 2.57 L
FEV1FVC-%Change-Post: 0 %
FEV1FVC-%Pred-Pre: 104 %
FEV6-%Change-Post: 2 %
FEV6-%Pred-Post: 89 %
FEV6-%Pred-Pre: 87 %
FEV6-Post: 3.35 L
FEV6-Pre: 3.26 L
FEV6FVC-%Change-Post: 0 %
FEV6FVC-%Pred-Post: 104 %
FEV6FVC-%Pred-Pre: 105 %
FVC-%Change-Post: 3 %
FVC-%Pred-Post: 85 %
FVC-%Pred-Pre: 82 %
FVC-Post: 3.37 L
FVC-Pre: 3.26 L
Post FEV1/FVC ratio: 78 %
Post FEV6/FVC ratio: 99 %
Pre FEV1/FVC ratio: 79 %
Pre FEV6/FVC Ratio: 100 %
RV % pred: 86 %
RV: 1.73 L
TLC % pred: 85 %
TLC: 5.13 L

## 2024-01-11 LAB — LIPID PANEL
Cholesterol: 140 mg/dL (ref <200)
HDL: 49 mg/dL (ref >=40)
LDL Cholesterol (Calc): 70 mg/dL
Non-HDL Cholesterol (Calc): 91 mg/dL (ref <130)
Total CHOL/HDL Ratio: 2.9 (calc) (ref <5.0)
Triglycerides: 126 mg/dL (ref <150)

## 2024-01-11 LAB — VITAMIN B12: Vitamin B-12: 1170 pg/mL — ABNORMAL HIGH (ref 200–1100)

## 2024-01-11 LAB — TSH: TSH: 0.82 m[IU]/L (ref 0.40–4.50)

## 2024-01-11 LAB — HEMOGLOBIN A1C
Hgb A1c MFr Bld: 6.4 % — ABNORMAL HIGH (ref <5.7)
Mean Plasma Glucose: 137 mg/dL
eAG (mmol/L): 7.6 mmol/L

## 2024-01-11 LAB — VITAMIN D 25 HYDROXY (VIT D DEFICIENCY, FRACTURES): Vit D, 25-Hydroxy: 46 ng/mL (ref 30–100)

## 2024-01-11 MED ORDER — ALBUTEROL SULFATE (2.5 MG/3ML) 0.083% IN NEBU
2.5000 mg | INHALATION_SOLUTION | Freq: Once | RESPIRATORY_TRACT | Status: AC
  Administered 2024-01-11: 2.5 mg via RESPIRATORY_TRACT

## 2024-01-12 ENCOUNTER — Encounter: Payer: Self-pay | Admitting: Internal Medicine

## 2024-01-12 ENCOUNTER — Ambulatory Visit (INDEPENDENT_AMBULATORY_CARE_PROVIDER_SITE_OTHER): Admitting: Internal Medicine

## 2024-01-12 ENCOUNTER — Ambulatory Visit (INDEPENDENT_AMBULATORY_CARE_PROVIDER_SITE_OTHER): Admitting: Family Medicine

## 2024-01-12 ENCOUNTER — Encounter: Payer: Self-pay | Admitting: Family Medicine

## 2024-01-12 VITALS — BP 120/70 | HR 63 | Ht 65.0 in | Wt 158.4 lb

## 2024-01-12 VITALS — BP 120/70 | HR 63 | Ht 65.0 in | Wt 158.6 lb

## 2024-01-12 DIAGNOSIS — Z0000 Encounter for general adult medical examination without abnormal findings: Secondary | ICD-10-CM

## 2024-01-12 DIAGNOSIS — E1169 Type 2 diabetes mellitus with other specified complication: Secondary | ICD-10-CM | POA: Diagnosis not present

## 2024-01-12 DIAGNOSIS — Z1211 Encounter for screening for malignant neoplasm of colon: Secondary | ICD-10-CM

## 2024-01-12 DIAGNOSIS — Z7985 Long-term (current) use of injectable non-insulin antidiabetic drugs: Secondary | ICD-10-CM

## 2024-01-12 DIAGNOSIS — Z024 Encounter for examination for driving license: Secondary | ICD-10-CM

## 2024-01-12 DIAGNOSIS — F32A Depression, unspecified: Secondary | ICD-10-CM

## 2024-01-12 DIAGNOSIS — F419 Anxiety disorder, unspecified: Secondary | ICD-10-CM

## 2024-01-12 DIAGNOSIS — E785 Hyperlipidemia, unspecified: Secondary | ICD-10-CM

## 2024-01-12 MED ORDER — ESCITALOPRAM OXALATE 20 MG PO TABS: 20.0000 mg | ORAL_TABLET | Freq: Every day | ORAL | 3 refills | Status: AC

## 2024-01-12 MED ORDER — OZEMPIC (0.25 OR 0.5 MG/DOSE) 2 MG/3ML ~~LOC~~ SOPN: 0.2500 mg | PEN_INJECTOR | SUBCUTANEOUS | 3 refills | Status: AC

## 2024-01-12 MED ORDER — ATORVASTATIN CALCIUM 20 MG PO TABS: 20.0000 mg | ORAL_TABLET | Freq: Every day | ORAL | 3 refills | Status: AC

## 2024-01-12 NOTE — Progress Notes (Signed)
 Commercial Driver Medical Examination   Brian Wolfe is a 63 y.o. male who presents today for a commercial driver fitness determination physical exam. The patient reports no problems. The following portions of the patient's history were reviewed and updated as appropriate: allergies, current medications, past family history, past medical history, past social history, past surgical history, and problem list. Review of Systems  Past Medical History:  Diagnosis Date   Anxiety    BPH with obstruction/lower urinary tract symptoms    COPD (chronic obstructive pulmonary disease) (HCC)    Depression    Diabetes mellitus without complication (HCC)    Elevated PSA    Fatty infiltration of liver    GERD (gastroesophageal reflux disease)    Headache    High grade prostatic intraepithelial neoplasia    Hypogonadism in male    NAFLD (nonalcoholic fatty liver disease)    Skin abscess     Current Outpatient Medications  Medication Sig Dispense Refill   albuterol  (VENTOLIN  HFA) 108 (90 Base) MCG/ACT inhaler TAKE 2 PUFFS BY MOUTH EVERY 6 HOURS AS NEEDED FOR WHEEZE OR SHORTNESS OF BREATH 8.5 each 0   aspirin  EC 81 MG tablet Take 1 tablet (81 mg total) by mouth daily. Swallow whole.     atorvastatin  (LIPITOR) 20 MG tablet TAKE 1 TABLET BY MOUTH EVERY DAY 90 tablet 1   blood glucose meter kit and supplies Dispense based on patient and insurance preference. Use up to four times daily as directed. (FOR ICD-10 E10.9, E11.9). 1 each 0   Blood Glucose Monitoring Suppl DEVI 1 each by Does not apply route in the morning, at noon, and at bedtime. May substitute to any manufacturer covered by patient's insurance. 1 each 0   co-enzyme Q-10 30 MG capsule Take 30 mg by mouth daily.     escitalopram  (LEXAPRO ) 20 MG tablet Take 1 tablet (20 mg total) by mouth daily. 90 tablet 0   Fluticasone-Umeclidin-Vilant (TRELEGY ELLIPTA ) 200-62.5-25 MCG/ACT AEPB Inhale 1 Act into the lungs daily. 1 each 5   metFORMIN   (GLUCOPHAGE -XR) 750 MG 24 hr tablet Take 1 tablet (750 mg total) by mouth daily with breakfast. 90 tablet 3   Multiple Vitamin (MULTIVITAMIN WITH MINERALS) TABS tablet Take 1 tablet by mouth daily.     nitroGLYCERIN  (NITROSTAT ) 0.4 MG SL tablet PLACE 1 TABLET UNDER THE TONGUE EVERY 5 MINUTES AS NEEDED FOR CHEST PAIN. MAX DOSE OF 3 TABS 75 tablet 2   omeprazole  (PRILOSEC) 20 MG capsule TAKE 1 CAPSULE BY MOUTH EVERY DAY BEFORE BREAKFAST 90 capsule 1   OZEMPIC , 0.25 OR 0.5 MG/DOSE, 2 MG/3ML SOPN INJECT 0.25 MG AS DIRECTED ONCE A WEEK. 6 mL 0   sucralfate  (CARAFATE ) 1 g tablet Take 1 tablet (1 g total) by mouth 4 (four) times daily -  with meals and at bedtime. As needed for acid reflux (Patient taking differently: Take 1 g by mouth daily as needed (acid reflux).) 30 tablet 2   tadalafil  (CIALIS ) 5 MG tablet Take 1 tablet (5 mg total) by mouth daily as needed for erectile dysfunction. 90 tablet 0   No current facility-administered medications for this visit.    No Known Allergies  Family History  Problem Relation Age of Onset   Hypertension Mother    Diabetes Mellitus II Mother    Heart disease Mother    Lung cancer Mother    Kidney disease Mother    Cancer Father        prostate cancer   Liver  cancer Father    Lung cancer Father    Bladder Cancer Neg Hx     Social History   Socioeconomic History   Marital status: Married    Spouse name: Not on file   Number of children: Not on file   Years of education: Not on file   Highest education level: GED or equivalent  Occupational History   Not on file  Tobacco Use   Smoking status: Every Day    Current packs/day: 1.00    Average packs/day: 1 pack/day for 40.0 years (40.0 ttl pk-yrs)    Types: Cigarettes   Smokeless tobacco: Never  Vaping Use   Vaping status: Never Used  Substance and Sexual Activity   Alcohol use: No    Alcohol/week: 0.0 standard drinks of alcohol   Drug use: No   Sexual activity: Not Currently  Other Topics  Concern   Not on file  Social History Narrative   Lives with wife Vernell, and daughter [name].  Indoor pets, cat and dog.   Social Drivers of Corporate investment banker Strain: Low Risk  (01/11/2024)   Overall Financial Resource Strain (CARDIA)    Difficulty of Paying Living Expenses: Not hard at all  Food Insecurity: No Food Insecurity (01/11/2024)   Hunger Vital Sign    Worried About Running Out of Food in the Last Year: Never true    Ran Out of Food in the Last Year: Never true  Transportation Needs: No Transportation Needs (01/11/2024)   PRAPARE - Administrator, Civil Service (Medical): No    Lack of Transportation (Non-Medical): No  Physical Activity: Inactive (01/11/2024)   Exercise Vital Sign    Days of Exercise per Week: 0 days    Minutes of Exercise per Session: Not on file  Stress: Stress Concern Present (01/11/2024)   [employer] of Occupational Health - Occupational Stress Questionnaire    Feeling of Stress: To some extent  Social Connections: Moderately Isolated (01/11/2024)   Social Connection and Isolation Panel    Frequency of Communication with Friends and Family: More than three times a week    Frequency of Social Gatherings with Friends and Family: Once a week    Attends Religious Services: Never    Database administrator or Organizations: No    Attends Engineer, structural: Not on file    Marital Status: Married  Catering manager Violence: Not on file     Constitutional: Denies fever, malaise, fatigue, headache or abrupt weight changes.  HEENT: Denies eye pain, eye redness, ear pain, ringing in the ears, wax buildup, runny nose, nasal congestion, bloody nose, or sore throat. Respiratory: Denies difficulty breathing, shortness of breath, cough or sputum production.   Cardiovascular: Denies chest pain, chest tightness, palpitations or swelling in the hands or feet.  Gastrointestinal: Patient reports reflux.  Denies abdominal pain,  bloating, constipation, diarrhea or blood in the stool.  GU: Patient reports erectile dysfunction.  Denies urgency, frequency, pain with urination, burning sensation, blood in urine, odor or discharge. Musculoskeletal: Denies decrease in range of motion, difficulty with gait, muscle pain or joint pain and swelling.  Skin: Denies redness, rashes, lesions or ulcercations.  Neurological: Denies dizziness, difficulty with memory, difficulty with speech or problems with balance and coordination.  Psych: Patient has a history of anxiety and depression.  Denies SI/HI.  No other specific complaints in a complete review of systems (except as listed in HPI above).   Objective:  Vision:  Hearing Screening   500Hz  1000Hz  2000Hz  4000Hz   Right ear Pass Pass Pass Pass  Left ear Pass Pass Pass Pass   Vision Screening   Right eye Left eye Both eyes  Without correction     With correction 20/20 20/25 20/25   Comments: Correct color check    Uncorrected Corrected Horizontal Field of Vision  Right Eye  20/20 70 degrees  Left Eye   20/25 70 degrees  Both Eyes   20/25    Applicant can recognize and distinguish among traffic control signals and devices showing standard red, green, and amber colors.  Applicant meets visual acuity requirement only when wearing corrective lenses.  Monocular Vision?: No   Hearing:        Right Ear  > 5 ft     Left Ear  > 71ft         BP 120/70   Pulse 63   Ht 5' 5 (1.651 m)   Wt 158 lb 9.6 oz (71.9 kg)   SpO2 94%   BMI 26.39 kg/m  Wt Readings from Last 3 Encounters:  01/12/24 158 lb 9.6 oz (71.9 kg)  01/12/24 158 lb 6 oz (71.8 kg)  10/20/23 161 lb 12.8 oz (73.4 kg)    General: Appears his stated age, overweight, in NAD. Skin: Warm, dry and intact. No =ulcerations noted. HEENT: Head: normal shape and size; Eyes: sclera white, no icterus, conjunctiva pink, PERRLA and EOMs intact; Ears: Tm's gray and intact, normal light reflex; Nose: mucosa pink and  moist, septum midline; Throat/Mouth: Teeth present, mucosa pink and moist, no exudate, lesions or ulcerations noted.  Neck:  Neck supple, trachea midline. No masses, lumps or thyromegaly present.  Cardiovascular: Normal rate and rhythm. S1,S2 noted.  No murmur, rubs or gallops noted. No JVD or BLE edema. No carotid bruits noted. Pulmonary/Chest: Normal effort and positive vesicular breath sounds. No respiratory distress. No wheezes, rales or ronchi noted.  Abdomen: Soft and nontender. Normal bowel sounds. No distention or masses noted. Liver, spleen and kidneys non palpable. Musculoskeletal: Normal range of motion.  Strength 5/5 BUE/BLE.  No difficulty with gait.  Neurological: Alert and oriented. Cranial nerves II-XII grossly intact. Coordination normal.  Psychiatric: Mood and affect normal. Behavior is normal. Judgment and thought content normal.    BMET    Component Value Date/Time   NA 140 01/11/2024 0848   NA 142 09/02/2023 1040   NA 141 05/04/2014 2302   K 4.3 01/11/2024 0848   K 3.7 05/04/2014 2302   CL 105 01/11/2024 0848   CL 105 05/04/2014 2302   CO2 26 01/11/2024 0848   CO2 29 05/04/2014 2302   GLUCOSE 139 (H) 01/11/2024 0848   GLUCOSE 103 (H) 05/04/2014 2302   BUN 13 01/11/2024 0848   BUN 11 09/02/2023 1040   BUN 12 05/04/2014 2302   CREATININE 0.78 01/11/2024 0848   CALCIUM  9.7 01/11/2024 0848   CALCIUM  8.4 (L) 05/04/2014 2302   GFRNONAA >60 02/11/2022 2301   GFRNONAA 89 04/10/2020 0954   GFRAA 103 04/10/2020 0954    Lipid Panel     Component Value Date/Time   CHOL 140 01/11/2024 0848   CHOL 165 07/29/2015 1410   TRIG 126 01/11/2024 0848   HDL 49 01/11/2024 0848   HDL 37 (L) 07/29/2015 1410   CHOLHDL 2.9 01/11/2024 0848   LDLCALC 70 01/11/2024 0848    CBC    Component Value Date/Time   WBC 6.4 01/11/2024 0848   RBC 4.19 (  L) 01/11/2024 0848   HGB 13.6 01/11/2024 0848   HGB 15.1 09/02/2023 1040   HCT 40.9 01/11/2024 0848   HCT 44.1 09/02/2023 1040    PLT 156 01/11/2024 0848   PLT 201 09/02/2023 1040   MCV 97.6 01/11/2024 0848   MCV 97 09/02/2023 1040   MCV 94 05/04/2014 2302   MCH 32.5 01/11/2024 0848   MCHC 33.3 01/11/2024 0848   RDW 12.7 01/11/2024 0848   RDW 12.4 09/02/2023 1040   RDW 12.6 05/04/2014 2302   LYMPHSABS 2,002 02/07/2023 0800   LYMPHSABS 2.4 02/01/2018 1050   MONOABS 0.6 02/11/2022 2301   EOSABS 288 01/11/2024 0848   EOSABS 0.3 02/01/2018 1050   BASOSABS 58 01/11/2024 0848   BASOSABS 0.1 02/01/2018 1050    Hgb A1C Lab Results  Component Value Date   HGBA1C 6.4 (H) 01/11/2024        Labs:  Urinalysis:  Specific gravity 1.010  Blood: Negative  Glucose: Negative  Protein: Negative  Assessment:    Healthy male exam.  Meets standards, but periodic monitoring required due to HTN and DM 2.  Driver qualified only for 1 year.    Plan:    Medical examiners certificate completed and printed. Return as needed. Angeline Laura, NP

## 2024-01-12 NOTE — Patient Instructions (Addendum)
 Thank you for coming to the office today.  Refills sent.  Referral for Colonoscopy  [facility] [address] [place], [place] [address] Hours: 8AM-5PM Phone: [phone]  Epidermal Cyst on Back We can refer to Surgery or Dermatology in future if you want to get it removed.  Please schedule a Follow-up Appointment to: No follow-ups on file.  If you have any other questions or concerns, please feel free to call the office or send a message through MyChart. You may also schedule an earlier appointment if necessary.  Additionally, you may be receiving a survey about your experience at our office within a few days to 1 week by e-mail or mail. We value your feedback.  Marsa Officer, DO [facility], [place]
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
## 2024-01-13 LAB — MICROALBUMIN / CREATININE URINE RATIO
Creatinine, Urine: 105 mg/dL (ref 20–320)
Microalb Creat Ratio: 5 mg/g{creat} (ref <30)
Microalb, Ur: 0.5 mg/dL

## 2024-01-15 ENCOUNTER — Ambulatory Visit: Payer: Self-pay | Admitting: Internal Medicine

## 2024-01-16 NOTE — Telephone Encounter (Signed)
 He needs to discuss the test with Dr. Isaiah when Dr. Isaiah returns.  Overall however the test does not show any major issues but he needs to discuss it with Dr. Isaiah since Dr. Isaiah is  who has evaluated him and can put the test in the proper context.

## 2024-01-18 ENCOUNTER — Encounter: Payer: Self-pay | Admitting: Urology

## 2024-01-19 ENCOUNTER — Ambulatory Visit: Payer: Self-pay | Admitting: Family Medicine

## 2024-01-27 ENCOUNTER — Other Ambulatory Visit: Payer: Managed Care, Other (non HMO)

## 2024-02-02 ENCOUNTER — Encounter: Payer: Self-pay | Admitting: Urology

## 2024-02-02 ENCOUNTER — Other Ambulatory Visit: Payer: Managed Care, Other (non HMO)

## 2024-02-02 ENCOUNTER — Ambulatory Visit: Admitting: Internal Medicine

## 2024-02-03 ENCOUNTER — Ambulatory Visit: Payer: Managed Care, Other (non HMO) | Admitting: Family Medicine

## 2024-02-06 ENCOUNTER — Other Ambulatory Visit

## 2024-02-07 ENCOUNTER — Ambulatory Visit: Admitting: Internal Medicine

## 2024-02-07 ENCOUNTER — Encounter: Payer: Self-pay | Admitting: Urology

## 2024-02-07 ENCOUNTER — Ambulatory Visit: Admitting: Family Medicine

## 2024-02-11 ENCOUNTER — Other Ambulatory Visit: Payer: Self-pay | Admitting: Urology

## 2024-02-18 ENCOUNTER — Other Ambulatory Visit: Payer: Self-pay | Admitting: Internal Medicine

## 2024-02-18 DIAGNOSIS — J449 Chronic obstructive pulmonary disease, unspecified: Secondary | ICD-10-CM

## 2024-02-23 NOTE — Telephone Encounter (Signed)
 Rescheduled lab appt and dr. Appt due to patient job

## 2024-02-24 ENCOUNTER — Other Ambulatory Visit

## 2024-02-24 DIAGNOSIS — N138 Other obstructive and reflux uropathy: Secondary | ICD-10-CM

## 2024-02-24 NOTE — Progress Notes (Deleted)
   03/01/2024 7:54 AM   Brian Wolfe 1960/12/16 [id number]  Reason for visit: Follow up history of prostate cancer, ED   HPI: 63 year old man, initial follow-up with me-previously followed by Dr. Penne    Prior HPI: Previously followed by Dr. Penne, history of intermediate risk prostate cancer  Prostate cancer- RALP + BPLND(12/2021)   -path = pT2N0 GS3+4, -sm PSA is undetectable, last PSA March 2025  Post RP SUI- Wears safety depends***  ED- Failed PDE 5 inhibitors Has not tried ICI  Physical Exam: There were no vitals taken for this visit.   Constitutional:  Alert and oriented, No acute distress.  GU: ***  Laboratory Data: No pertinent lab data to review at this visit***  Pertinent Imaging: I have personally viewed and interpreted the ***.   Assessment & Plan:    Prostate cancer [facility]) Assessment & Plan: RALP + BPLND(12/2021)   -path = pT2N0 GS3+4, -sm  PSAs undetectable, last PSA March 2025  - continue surveillance, PSA in 6 month- will plan to transition to annual if stable          [name], MD  [facility] [address] [place], [place] [address] [phone]

## 2024-02-24 NOTE — Assessment & Plan Note (Deleted)
 RALP + BPLND(12/2021)   -path = pT2N0 GS3+4, -sm  PSAs undetectable, last PSA March 2025  - continue surveillance, PSA in 6 month- will plan to transition to annual if stable

## 2024-02-25 LAB — PSA: Prostate Specific Ag, Serum: <0.1 ng/mL (ref 0.0–4.0)

## 2024-03-01 ENCOUNTER — Ambulatory Visit: Admitting: Urology

## 2024-03-01 DIAGNOSIS — C61 Malignant neoplasm of prostate: Secondary | ICD-10-CM

## 2024-03-02 ENCOUNTER — Emergency Department
Admission: EM | Admit: 2024-03-02 | Discharge: 2024-03-02 | Disposition: A | Discharge status: Home / Self Care | Attending: Emergency Medicine | Admitting: Emergency Medicine

## 2024-03-02 ENCOUNTER — Emergency Department

## 2024-03-02 ENCOUNTER — Other Ambulatory Visit: Payer: Self-pay

## 2024-03-02 ENCOUNTER — Encounter: Payer: Self-pay | Admitting: Emergency Medicine

## 2024-03-02 DIAGNOSIS — M542 Cervicalgia: Secondary | ICD-10-CM | POA: Diagnosis present

## 2024-03-02 DIAGNOSIS — M533 Sacrococcygeal disorders, not elsewhere classified: Secondary | ICD-10-CM | POA: Diagnosis not present

## 2024-03-02 DIAGNOSIS — R030 Elevated blood-pressure reading, without diagnosis of hypertension: Secondary | ICD-10-CM | POA: Insufficient documentation

## 2024-03-02 DIAGNOSIS — W19XXXA Unspecified fall, initial encounter: Secondary | ICD-10-CM | POA: Diagnosis not present

## 2024-03-02 DIAGNOSIS — E119 Type 2 diabetes mellitus without complications: Secondary | ICD-10-CM | POA: Diagnosis not present

## 2024-03-02 DIAGNOSIS — Z8546 Personal history of malignant neoplasm of prostate: Secondary | ICD-10-CM | POA: Insufficient documentation

## 2024-03-02 MED ORDER — MELOXICAM 15 MG PO TABS: 15.0000 mg | ORAL_TABLET | Freq: Every day | ORAL | 0 refills | Status: AC

## 2024-03-02 MED ORDER — HYDROCODONE-ACETAMINOPHEN 5-325 MG PO TABS
1.0000 | ORAL_TABLET | Freq: Once | ORAL | Status: AC
  Administered 2024-03-02: 1 via ORAL
  Filled 2024-03-02: qty 1

## 2024-03-02 MED ORDER — HYDROCODONE-ACETAMINOPHEN 5-325 MG PO TABS: 1.0000 | ORAL_TABLET | Freq: Four times a day (QID) | ORAL | 0 refills | Status: AC | PRN

## 2024-03-02 NOTE — Discharge Instructions (Signed)
 Follow-up with your primary care provider or find a primary care provider to recheck your blood pressure as it was elevated while you are in the emergency department.  A prescription for meloxicam  was sent to the pharmacy for you to begin taking once daily with food and also a limited number of hydrocodone  as needed for moderate to severe pain.  Do not drive or operate machinery while taking the pain medication as it could cause drowsiness and increase your risk for injury.  The meloxicam  should be taken with food to prevent stomach upset and this will help with inflammation of your degenerative changes.

## 2024-03-02 NOTE — ED Notes (Signed)
 See triage note  Presents with neck pain  States developed pain to neck after working in the yard this week  Denies any trauma But states he is still having pain s/p a fall in May  This pain is mainly to tailbone area  Ambulates well

## 2024-03-02 NOTE — ED Provider Notes (Signed)
 [facility] Provider Note    Event Date/Time   First MD Initiated Contact with Patient 03/02/24 1250     (approximate)   History   Neck Pain   HPI  Brian Wolfe is a 63 y.o. male   presents to the ED with complaint of cervical pain since Tuesday after working out in his yard.  He also reports that he has had sacral pain from fall that occurred in May of this year.  He states that he went to his PCP who did not check his sacrum out.  Patient has continued to ambulate without any assistance since that injury.  He denies any head injury or loss of consciousness with the fall in May.  Patient has history of anxiety/depression, GERD, elevated PSA, BPH, diabetes type 2, prostate cancer.      Physical Exam   Triage Vital Signs: ED Triage Vitals  Encounter Vitals Group     BP 03/02/24 1241 (!) 177/111     Girls Systolic BP Percentile --      Girls Diastolic BP Percentile --      Boys Systolic BP Percentile --      Boys Diastolic BP Percentile --      Pulse Rate 03/02/24 1241 75     Resp 03/02/24 1241 16     Temp 03/02/24 1241 99.6 F (37.6 C)     Temp Source 03/02/24 1241 Oral     SpO2 03/02/24 1241 98 %     Weight 03/02/24 1241 158 lb 8.2 oz (71.9 kg)     Height --      Head Circumference --      Peak Flow --      Pain Score 03/02/24 1240 10     Pain Loc --      Pain Education --      Exclude from Growth Chart --     Most recent vital signs: Vitals:   03/02/24 1241 03/02/24 1439  BP: (!) 177/111 124/69  Pulse: 75 70  Resp: 16 16  Temp: 99.6 F (37.6 C)   SpO2: 98% 98%     General: Awake, no distress.  CV:  Good peripheral perfusion.  Heart regular rate rhythm. Resp:  Normal effort.  Lungs clear bilaterally. Abd:  No distention.  Other:  Diffuse tenderness on palpation of the cervical spine but no gross deformity, skin discoloration or evidence of trauma.  Range of motion is slow and guarded secondary to discomfort.  Patient able to  move upper and lower extremities without any difficulty.  There is some tenderness on palpation of the lower lumbar sacral area but no step-offs are appreciated.  Patient is able to stand and ambulate without any assistance.   ED Results / Procedures / Treatments   Labs (all labs ordered are listed, but only abnormal results are displayed) Labs Reviewed - No data to display    RADIOLOGY Cervical spine x-ray images were reviewed and interpreted by myself and pending the radiologist without fracture or subluxation.  Degenerative changes are noted.  Official radiology report shows multilevel degenerative changes C3-C6.  Lumbar spine x-ray images were reviewed and interpreted by myself independent of the radiologist with degenerative changes noted.  Official radiology report is negative for fracture or subluxation.  There at L3-L4, L4-L5 and L5-S1.   PROCEDURES:  Critical Care performed:   Procedures   MEDICATIONS ORDERED IN ED: Medications  HYDROcodone -acetaminophen  (NORCO/VICODIN) 5-325 MG per tablet 1 tablet (1 tablet Oral  Given 03/02/24 1359)     IMPRESSION / MDM / ASSESSMENT AND PLAN / ED COURSE  I reviewed the triage vital signs and the nursing notes.   Differential diagnosis includes, but is not limited to, cervical pain, cervical strain, subluxation, muscle skeletal pain, sacral/coccyx fracture, degenerative changes, osteoarthritis multilevels.  63 year old male presents to the ED with complaint of cervical pain that started recently along with a complaint of sacral/coccyx pain that happened in May when he fell.  Patient received hydrocodone  while in the emergency department which he states helped ease his pain.  X-rays were reassuring and patient was made aware that he has some degenerative changes.  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  Hydrocodone  for the next 2 to 3 days but he is  aware that he cannot drive or operate machinery while taking this medication as it could cause drowsiness and increase his risk for injury.  He has follow-up with his PCP if any continued problems.  He reports that he plans on getting a new PCP as his sacral injury was not addressed at his last appointment.  Vital signs were improved prior to discharge with blood pressure initially being 177/111 which was improved to 124/69.      Patient's presentation is most consistent with acute complicated illness / injury requiring diagnostic workup.  FINAL CLINICAL IMPRESSION(S) / ED DIAGNOSES   Final diagnoses:  Cervical spine pain  Coccyx pain  Fall, initial encounter  Elevated blood pressure reading     Rx / DC Orders   ED Discharge Orders          Ordered    meloxicam  (MOBIC ) 15 MG tablet  Daily        03/02/24 1439    HYDROcodone -acetaminophen  (NORCO/VICODIN) 5-325 MG tablet  Every 6 hours PRN        03/02/24 1439             Note:  This document was prepared using Dragon voice recognition software and may include unintentional dictation errors.   Saunders Shona CROME, PA-C 03/02/24 1532    Floy Roberts, MD 03/02/24 [phone]

## 2024-03-02 NOTE — ED Triage Notes (Signed)
 Neck pain since Tuesday and out doing yard work. Also c/o sacral pain, from fall in May.

## 2024-03-06 ENCOUNTER — Encounter: Payer: Self-pay | Admitting: Internal Medicine

## 2024-03-06 ENCOUNTER — Ambulatory Visit
Admission: RE | Admit: 2024-03-06 | Discharge: 2024-03-06 | Disposition: A | Discharge status: Home / Self Care | Source: Ambulatory Visit | Attending: Internal Medicine | Admitting: Internal Medicine

## 2024-03-06 ENCOUNTER — Ambulatory Visit: Admitting: Internal Medicine

## 2024-03-06 VITALS — BP 118/74 | HR 61 | Ht 65.0 in | Wt 157.2 lb

## 2024-03-06 DIAGNOSIS — M533 Sacrococcygeal disorders, not elsewhere classified: Secondary | ICD-10-CM | POA: Diagnosis present

## 2024-03-06 DIAGNOSIS — W010XXA Fall on same level from slipping, tripping and stumbling without subsequent striking against object, initial encounter: Secondary | ICD-10-CM | POA: Diagnosis not present

## 2024-03-06 DIAGNOSIS — M542 Cervicalgia: Secondary | ICD-10-CM

## 2024-03-06 DIAGNOSIS — Z981 Arthrodesis status: Secondary | ICD-10-CM

## 2024-03-06 DIAGNOSIS — S300XXA Contusion of lower back and pelvis, initial encounter: Secondary | ICD-10-CM

## 2024-03-06 DIAGNOSIS — G8929 Other chronic pain: Secondary | ICD-10-CM
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
## 2024-03-07 ENCOUNTER — Encounter: Payer: Self-pay | Admitting: Internal Medicine

## 2024-03-07 NOTE — Patient Instructions (Signed)
 Hematoma A hematoma is a collection of blood. A hematoma can happen: Under the skin. In an organ. In a body space. In a joint space. In other tissues. The blood can thicken (clot) to form a lump that you can see and feel. The lump is often hard and may become sore and tender. The lump can be very small or very big. Most hematomas get better in a few days to weeks. However, some of these may be serious and need medical care. What are the causes? This condition is caused by: An injury. Blood that leaks under the skin. Problems from surgeries. Medical conditions that cause bleeding or bruising. What increases the risk? You are more likely to develop this condition if: You are an older adult. You use medicines that thin your blood. You use NSAIDs, such as ibuprofen, often for pain. You play contact sports. What are the signs or symptoms? Symptoms depend on where the hematoma is in your body. If the hematoma is under the skin, there is: A firm lump on the body. Pain and tenderness in the area. Bruising. The skin above the lump may be blue, dark blue, purple-red, or yellowish. If the hematoma is deep in the tissues or body spaces, there may be: Blood in the stomach. This may cause pain in the belly (abdomen), weakness, passing out (fainting), and shortness of breath. Blood in the head. This may cause a headache, weakness, trouble speaking or understanding speech, or passing out. How is this treated? Treatment depends on the cause, size, and location of the hematoma. Treatment may include: Doing nothing. Many hematomas go away on their own without treatment. Surgery or close monitoring. This may be needed for large hematomas or hematomas that affect the body's organs. Medicines. These may be given if a medical condition caused the hematoma. Follow these instructions at home: Managing pain, stiffness, and swelling  If told, put ice on the injured area. To do this: Put ice in a plastic  bag. Place a towel between your skin and the bag. Leave the ice on for 20 minutes, 2-3 times a day for the first two days. If your skin turns bright red, take off the ice right away to prevent skin damage. The risk of skin damage is higher if you cannot feel pain, heat, or cold. If told, put heat on the injured area. Do this as often as told by your doctor. Use the heat source that your doctor recommends, such as a moist heat pack or a heating pad. Place a towel between your skin and the heat source. Leave the heat on for 20-30 minutes. If your skin turns bright red, take off the heat right away to prevent burns. The risk of burns is higher if you cannot feel pain, heat, or cold. Raise the injured area above the level of your heart while you are sitting or lying down. Wrap the affected area with an elastic bandage, if told by your doctor. Do not wrap the bandage too tight. If your hematoma is on a leg or foot and is painful, your doctor may give you crutches. Use them as told by your doctor. General instructions Take over-the-counter and prescription medicines only as told by your doctor. Keep all follow-up visits. Your doctor may want to see how your hematoma is healing with treatment. Contact a doctor if: You have a fever. The swelling or bruising gets worse. You start to get more hematomas. Your pain gets worse. Your pain is not getting better  with medicine. The skin over the hematoma breaks or starts to bleed. Get help right away if: Your hematoma is in your chest or belly and you: Pass out. Feel weak. Become short of breath. You have a hematoma on your scalp that is caused by a fall or injury, and you: Have a headache that gets worse. Have trouble speaking or understanding speech. Become less alert or you pass out. These symptoms may be an emergency. Get help right away. Call 911. Do not wait to see if the symptoms will go away. Do not drive yourself to the hospital This  information is not intended to replace advice given to you by your health care provider. Make sure you discuss any questions you have with your health care provider. Document Revised: 11/30/2021 Document Reviewed: 11/30/2021 Elsevier Patient Education  2024 [employer].

## 2024-03-12 ENCOUNTER — Ambulatory Visit: Payer: Self-pay | Admitting: Internal Medicine

## 2024-03-14 NOTE — Assessment & Plan Note (Signed)
 S/p RALP 01/19/24 - Dr. Penne Path = pT2N0 GS3+4, -sm   PSA undetectable since surgery, last 02/24/24  - continue q6 month PSA surveillance

## 2024-03-14 NOTE — Progress Notes (Unsigned)
   03/16/2024 2:27 PM   Brian Wolfe 13-Jul-1960 [id number]  Reason for visit: Follow up prostate Ca   HPI: Initial follow up with me, previously followed by Dr. Penne for prostate Ca  PSA undetectable (02/24/24)  Prior HPI: Prostate Ca: S/p RALP 01/19/24 - Dr. Penne Path = pT2N0 GS3+4, -sm   Post-RP ED, and SUI  - on Trimix 0.6 30/1/10 Last seen on May 2025  Physical Exam: There were no vitals taken for this visit.   Constitutional:  Alert and oriented, No acute distress.  GU: ***  Laboratory Data: No pertinent lab data to review at this visit***  Pertinent Imaging: I have personally viewed and interpreted the ***.   Assessment & Plan:    Prostate cancer [facility]) Assessment & Plan: S/p RALP 01/19/24 - Dr. Penne Path = pT2N0 GS3+4, -sm   PSA undetectable since surgery, last 02/24/24  - continue q6 month PSA surveillance        [name], MD  [facility] [address] [place], [place] [address] [phone]

## 2024-03-16 ENCOUNTER — Other Ambulatory Visit: Payer: Self-pay

## 2024-03-16 ENCOUNTER — Encounter: Payer: Self-pay | Admitting: Urology

## 2024-03-16 ENCOUNTER — Ambulatory Visit: Admitting: Urology

## 2024-03-16 ENCOUNTER — Other Ambulatory Visit

## 2024-03-16 VITALS — BP 139/80 | HR 69 | Ht 65.0 in | Wt 156.2 lb

## 2024-03-16 DIAGNOSIS — R7989 Other specified abnormal findings of blood chemistry: Secondary | ICD-10-CM | POA: Diagnosis not present

## 2024-03-16 DIAGNOSIS — N5231 Erectile dysfunction following radical prostatectomy: Secondary | ICD-10-CM

## 2024-03-16 DIAGNOSIS — C61 Malignant neoplasm of prostate: Secondary | ICD-10-CM | POA: Diagnosis not present

## 2024-03-16 NOTE — Assessment & Plan Note (Signed)
 Previously on TRT prior to cancer treatment Unclear initial clinical indications Currently: Decreased libido, ED, energy levels  To answer his questions, I do not think this is the driving pathology for his ED.  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   I am open to restarting TRT in the future, perhaps at 3 years postop if NED.

## 2024-03-16 NOTE — Assessment & Plan Note (Addendum)
 Post-RP ED  -On daily 5 mg Cialis  - Previously tried Trimix 0.6cc 30/1/10 (started May 2025)  I had a thoughtful conversation with him today.  He is frustrated with his ED, but not overly keen to try more aggressive strategies.  I mention to options: Continuing to uptitrate Trimix, transition to quad mix, versus consideration of IPP placement.  Ultimately he wanted to discuss further with his wife.  He will let our clinic know any change in treatment decision

## 2024-03-20 ENCOUNTER — Ambulatory Visit: Payer: Self-pay | Admitting: Urology

## 2024-03-22 ENCOUNTER — Ambulatory Visit: Admitting: Urology

## 2024-03-23 ENCOUNTER — Ambulatory Visit: Admitting: Urology

## 2024-05-22 ENCOUNTER — Other Ambulatory Visit: Payer: Self-pay | Admitting: Family Medicine

## 2024-05-22 DIAGNOSIS — K219 Gastro-esophageal reflux disease without esophagitis: Secondary | ICD-10-CM

## 2024-05-23 ENCOUNTER — Other Ambulatory Visit: Payer: Self-pay | Admitting: Urology

## 2024-05-23 MED ORDER — AMBULATORY NON FORMULARY MEDICATION: 0 refills | Status: DC

## 2024-05-24 ENCOUNTER — Encounter: Payer: Self-pay | Admitting: Family Medicine

## 2024-05-24 DIAGNOSIS — K219 Gastro-esophageal reflux disease without esophagitis: Secondary | ICD-10-CM

## 2024-05-24 MED ORDER — OMEPRAZOLE 20 MG PO CPDR: DELAYED_RELEASE_CAPSULE | ORAL | 1 refills | Status: AC

## 2024-05-24 NOTE — Telephone Encounter (Signed)
 Requested Prescriptions  Refused Prescriptions Disp Refills   omeprazole  (PRILOSEC) 20 MG capsule [Pharmacy Med Name: OMEPRAZOLE  DR 20 MG CAPSULE] 90 capsule 1    Sig: TAKE 1 CAPSULE BY MOUTH EVERY DAY BEFORE BREAKFAST     Gastroenterology: Proton Pump Inhibitors Passed - 05/24/2024  3:56 PM      Passed - Valid encounter within last 12 months    Recent Outpatient Visits           2 months ago Sacral pain   [facility] [place], Angeline ORN, NP   4 months ago Encounter for Department of Transportation (DOT) examination for driving license renewal   [facility] [place], Angeline ORN, NP   4 months ago Annual physical exam   [place] [facility] [place] Marsa PARAS, DO   9 months ago Type 2 diabetes mellitus with other specified complication, without long-term current use of insulin  [facility])   [facility] [place], Marsa PARAS, DO       Future Appointments             In 2 weeks McGowan, Clotilda DELENA RIGGERS [facility] [place]   In 3 months Ge[place]ne, Penne SAUNDERS, MD [facility]

## 2024-06-07 ENCOUNTER — Ambulatory Visit: Admitting: Urology

## 2024-07-09 ENCOUNTER — Other Ambulatory Visit: Payer: Self-pay | Admitting: Urology

## 2024-07-09 MED ORDER — AMBULATORY NON FORMULARY MEDICATION: 0 refills | Status: AC

## 2024-07-16 ENCOUNTER — Ambulatory Visit: Admitting: Urology

## 2024-07-17 ENCOUNTER — Ambulatory Visit: Admitting: Family Medicine

## 2024-07-18 ENCOUNTER — Ambulatory Visit: Admitting: Family Medicine

## 2024-07-30 ENCOUNTER — Ambulatory Visit: Admitting: Family Medicine

## 2024-09-10 ENCOUNTER — Other Ambulatory Visit

## 2024-09-13 ENCOUNTER — Ambulatory Visit: Admitting: Urology
